# Patient Record
Sex: Female | Born: 1995 | Race: White | Hispanic: No | Marital: Single | Smoking: Never smoker
Health system: Southern US, Community
[De-identification: ages and names within clinical notes are randomized; demographics above are authoritative.]

## PROBLEM LIST (undated history)

## (undated) DIAGNOSIS — K5900 Constipation, unspecified: Secondary | ICD-10-CM

## (undated) DIAGNOSIS — I951 Orthostatic hypotension: Secondary | ICD-10-CM

## (undated) DIAGNOSIS — F909 Attention-deficit hyperactivity disorder, unspecified type: Secondary | ICD-10-CM

## (undated) DIAGNOSIS — T7840XA Allergy, unspecified, initial encounter: Secondary | ICD-10-CM

## (undated) DIAGNOSIS — Z9109 Other allergy status, other than to drugs and biological substances: Secondary | ICD-10-CM

## (undated) DIAGNOSIS — R110 Nausea: Secondary | ICD-10-CM

## (undated) DIAGNOSIS — F419 Anxiety disorder, unspecified: Secondary | ICD-10-CM

## (undated) HISTORY — DX: Allergy, unspecified, initial encounter: T78.40XA

## (undated) HISTORY — DX: Nausea: R11.0

## (undated) HISTORY — DX: Constipation, unspecified: K59.00

## (undated) HISTORY — DX: Anxiety disorder, unspecified: F41.9

## (undated) HISTORY — DX: Orthostatic hypotension: I95.1

## (undated) HISTORY — PX: TYMPANOSTOMY TUBE PLACEMENT: SHX32

## (undated) HISTORY — DX: Attention-deficit hyperactivity disorder, unspecified type: F90.9

## (undated) HISTORY — DX: Other allergy status, other than to drugs and biological substances: Z91.09

---
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
## 2017-02-19 ENCOUNTER — Telehealth (HOSPITAL_COMMUNITY): Payer: Self-pay | Admitting: *Deleted

## 2017-02-19 NOTE — Telephone Encounter (Signed)
Pt mother called stating pt is having a hard time in [place]. Per pt mother, pt is Have suicidal thoughts and was got in to be seen at a counselor there. Per pt mother she thinks it was momentary. Per pt mother she don't think pt have those thoughts right now. Per pt mother pt is going to [employer] in [place] and pt should be returning home in December. Per pt mother, she do not know if pt needs to adjust her medications or not. Per pt mother, pt is struggling in studying but she is making decent grades. Per pt mother, she is having to mail pt her medications monthly due to the pharmacy in [place] informing her that they can not fill her medications there due to pt having Medicaid.

## 2017-02-20 NOTE — Telephone Encounter (Signed)
I cannot adjust meds from here. She needs to see a psychiatrist in [place] ASAP

## 2017-02-26 ENCOUNTER — Encounter (HOSPITAL_COMMUNITY): Payer: Self-pay | Admitting: *Deleted

## 2017-03-03 NOTE — Telephone Encounter (Signed)
Spoke with pt mother and per pt mother, pt informed her after office conversation that she was feeling better. Per pt mother, pt informed her that she is currently seeing her therapist twice a week and she thinks she feels better since then. Pt mother verbalized understanding with what provider stated.

## 2017-06-05 ENCOUNTER — Ambulatory Visit: Payer: Self-pay | Admitting: Family Medicine

## 2017-06-12 ENCOUNTER — Encounter: Payer: Self-pay | Admitting: Family Medicine

## 2017-07-15 ENCOUNTER — Telehealth (HOSPITAL_COMMUNITY): Payer: Self-pay | Admitting: *Deleted

## 2017-07-15 NOTE — Telephone Encounter (Signed)
She will need to come in 

## 2017-07-15 NOTE — Telephone Encounter (Signed)
Called Patient Per Dr Tenny Crawoss to come in for a office visit

## 2017-07-15 NOTE — Telephone Encounter (Signed)
Dr Tenny Tucker Samantha went to [employer] last semester. But unfortunately her anxiety & depression got the best of her   and she was unable to return. Housing will excuse her from returning back to school with a doctor's note   stating that she has been under care. Aman said you could e-mail her the note & she would e-mail to school.  E-mail address: [email or web address]

## 2017-07-16 ENCOUNTER — Encounter (HOSPITAL_COMMUNITY): Payer: Self-pay | Admitting: Psychiatry

## 2017-07-16 ENCOUNTER — Telehealth: Payer: Self-pay

## 2017-07-16 NOTE — Telephone Encounter (Signed)
done

## 2017-07-16 NOTE — Telephone Encounter (Signed)
Contacted mom Shawna OrleansMelanie and she will stop by office to pick up letter. I informed her that it will be at the front desk

## 2017-07-16 NOTE — Telephone Encounter (Signed)
Mom called requesting a letter be written for Samantha Tucker stating that she is currently under Dr. Tenny Crawoss care and when she first started coming to the office.  Dina has an appointment scheduled for 07/22/17. Please advise

## 2017-07-22 ENCOUNTER — Ambulatory Visit (INDEPENDENT_AMBULATORY_CARE_PROVIDER_SITE_OTHER): Payer: Medicaid Other | Admitting: Family Medicine

## 2017-07-22 ENCOUNTER — Other Ambulatory Visit: Payer: Self-pay | Admitting: Family Medicine

## 2017-07-22 ENCOUNTER — Encounter: Payer: Self-pay | Admitting: Family Medicine

## 2017-07-22 ENCOUNTER — Encounter (HOSPITAL_COMMUNITY): Payer: Self-pay | Admitting: Psychiatry

## 2017-07-22 ENCOUNTER — Ambulatory Visit (INDEPENDENT_AMBULATORY_CARE_PROVIDER_SITE_OTHER): Payer: Medicaid Other | Admitting: Psychiatry

## 2017-07-22 VITALS — BP 137/80 | HR 60 | Temp 98.9°F | Ht 61.0 in | Wt 158.0 lb

## 2017-07-22 DIAGNOSIS — Z114 Encounter for screening for human immunodeficiency virus [HIV]: Secondary | ICD-10-CM | POA: Diagnosis not present

## 2017-07-22 DIAGNOSIS — K5900 Constipation, unspecified: Secondary | ICD-10-CM | POA: Diagnosis not present

## 2017-07-22 DIAGNOSIS — N898 Other specified noninflammatory disorders of vagina: Secondary | ICD-10-CM | POA: Diagnosis not present

## 2017-07-22 DIAGNOSIS — N946 Dysmenorrhea, unspecified: Secondary | ICD-10-CM | POA: Diagnosis not present

## 2017-07-22 DIAGNOSIS — Z811 Family history of alcohol abuse and dependence: Secondary | ICD-10-CM | POA: Diagnosis not present

## 2017-07-22 DIAGNOSIS — R000 Tachycardia, unspecified: Secondary | ICD-10-CM

## 2017-07-22 DIAGNOSIS — I951 Orthostatic hypotension: Secondary | ICD-10-CM

## 2017-07-22 DIAGNOSIS — Z818 Family history of other mental and behavioral disorders: Secondary | ICD-10-CM | POA: Diagnosis not present

## 2017-07-22 DIAGNOSIS — I498 Other specified cardiac arrhythmias: Secondary | ICD-10-CM | POA: Insufficient documentation

## 2017-07-22 DIAGNOSIS — R450 Nervousness: Secondary | ICD-10-CM | POA: Diagnosis not present

## 2017-07-22 DIAGNOSIS — F331 Major depressive disorder, recurrent, moderate: Secondary | ICD-10-CM | POA: Diagnosis not present

## 2017-07-22 DIAGNOSIS — F419 Anxiety disorder, unspecified: Secondary | ICD-10-CM | POA: Diagnosis not present

## 2017-07-22 DIAGNOSIS — G90A Postural orthostatic tachycardia syndrome (POTS): Secondary | ICD-10-CM | POA: Insufficient documentation

## 2017-07-22 LAB — PREGNANCY, URINE: Preg Test, Ur: NEGATIVE

## 2017-07-22 LAB — WET PREP FOR TRICH, YEAST, CLUE
Clue Cell Exam: NEGATIVE
Trichomonas Exam: NEGATIVE
Yeast Exam: NEGATIVE

## 2017-07-22 MED ORDER — AMPHETAMINE-DEXTROAMPHET ER 20 MG PO CP24: 20.0000 mg | ORAL_CAPSULE | Freq: Every day | ORAL | 0 refills | Status: DC

## 2017-07-22 MED ORDER — GABAPENTIN 300 MG PO CAPS: 300.0000 mg | ORAL_CAPSULE | Freq: Three times a day (TID) | ORAL | 2 refills | Status: DC

## 2017-07-22 MED ORDER — BUPROPION HCL ER (XL) 300 MG PO TB24: 300.0000 mg | ORAL_TABLET | ORAL | 2 refills | Status: DC

## 2017-07-22 MED ORDER — TRAZODONE HCL 100 MG PO TABS: 100.0000 mg | ORAL_TABLET | Freq: Every day | ORAL | 2 refills | Status: DC

## 2017-07-22 MED ORDER — NAPROXEN 500 MG PO TABS: 500.0000 mg | ORAL_TABLET | Freq: Two times a day (BID) | ORAL | 0 refills | Status: DC

## 2017-07-22 NOTE — Progress Notes (Signed)
 Subjective: CC: menstrual cramps PCP: Dettinger, Joshua A, MD HPI:Samantha Tucker is a 22 y.o. female presenting to clinic today for:  1. Menstrual cramps/ Vaginal discharge Patient has a several year history of cramping with periods.  She notes that over the last 6-7 months, cramping has become more severe.  She notes associated nausea, fatigue, headache.  She reports that the first 3 days of her period are typically heavy but they lighten up after that.  Periods occur every 21-28 days and typically last about 5 days.  Denies any heavy menstrual bleeding during the day but notes that sometimes is heavy at night such that she has bleedthrough's.  She notes a history of constipation, which seems to alternate with diarrhea during periods.  Mother notes that her medical history significant for POTS.  Family history significant for maternal history of fibroids and PCOS.  Negative family history for clotting disorders.  Patient has never been sexually active.  She does report some abnormal vaginal discharge that has an odor to it.  She notes she wears a panty liner for this but has never had it evaluated.  2. Constipation Patient notes a long-standing history of constipation.  She notes that she has increased her fiber, water intake and used MiraLAX in the past with little improvement.  Constipation seems to worse and alternates with diarrhea during menstrual cycles.  Denies blood in stool.  Nausea as above.  ROS: Per HPI  Allergies  Allergen Reactions  . Other Hives and Shortness Of Breath    Develops hives with exposure to cold air.   . Claritin [Loratadine]     Fainting spells  . Sulfa Antibiotics   . Latex Hives   Past Medical History:  Diagnosis Date  . ADHD (attention deficit hyperactivity disorder)   . Allergy   . Anxiety   . Constipation   . Environmental allergies   . Nausea   . Postural hypotension     Current Outpatient Medications:  .  albuterol (PROVENTIL HFA;VENTOLIN HFA)  108 (90 BASE) MCG/ACT inhaler, Inhale 2 puffs into the lungs every 6 (six) hours as needed for wheezing or shortness of breath. Reported on 07/03/2015, Disp: , Rfl:  .  amoxicillin-clavulanate (AUGMENTIN) 875-125 MG tablet, Take 1 tablet by mouth 2 (two) times daily., Disp: 20 tablet, Rfl: 0 .  amphetamine-dextroamphetamine (ADDERALL XR) 15 MG 24 hr capsule, Take 1 capsule by mouth daily., Disp: 30 capsule, Rfl: 0 .  amphetamine-dextroamphetamine (ADDERALL XR) 15 MG 24 hr capsule, Take 1 capsule by mouth daily., Disp: 30 capsule, Rfl: 0 .  amphetamine-dextroamphetamine (ADDERALL XR) 15 MG 24 hr capsule, Take 1 capsule by mouth daily., Disp: 30 capsule, Rfl: 0 .  amphetamine-dextroamphetamine (ADDERALL XR) 15 MG 24 hr capsule, Take 1 capsule by mouth daily., Disp: 30 capsule, Rfl: 0 .  buPROPion (WELLBUTRIN XL) 300 MG 24 hr tablet, Take 1 tablet (300 mg total) by mouth every morning., Disp: 90 tablet, Rfl: 2 .  desloratadine (CLARINEX) 5 MG tablet, Take 5 mg by mouth daily., Disp: , Rfl:  .  fluticasone (FLONASE) 50 MCG/ACT nasal spray, Place 2 sprays into both nostrils daily., Disp: 16 g, Rfl: 4 .  gabapentin (NEURONTIN) 300 MG capsule, Take 1 capsule (300 mg total) by mouth 2 (two) times daily., Disp: 180 capsule, Rfl: 2 .  montelukast (SINGULAIR) 10 MG tablet, Take 10 mg by mouth at bedtime., Disp: , Rfl:  .  Olopatadine HCl (PATANASE NA), Place into the nose daily. Reported on   05/26/2015, Disp: , Rfl:  .  ondansetron (ZOFRAN) 4 MG tablet, Take 1 tablet (4 mg total) by mouth every 8 (eight) hours as needed for nausea or vomiting., Disp: 30 tablet, Rfl: 1 .  traZODone (DESYREL) 100 MG tablet, Take 1 tablet (100 mg total) by mouth at bedtime., Disp: 90 tablet, Rfl: 2 Social History   Socioeconomic History  . Marital status: Single    Spouse name: Not on file  . Number of children: Not on file  . Years of education: Not on file  . Highest education level: Not on file  Social Needs  . Financial  resource strain: Not on file  . Food insecurity - worry: Not on file  . Food insecurity - inability: Not on file  . Transportation needs - medical: Not on file  . Transportation needs - non-medical: Not on file  Occupational History  . Not on file  Tobacco Use  . Smoking status: Never Smoker  . Smokeless tobacco: Never Used  Substance and Sexual Activity  . Alcohol use: No    Comment: 12-27-15 per pt no   . Drug use: No    Comment: 12-27-15 per pt no   . Sexual activity: No  Other Topics Concern  . Not on file  Social History Narrative  . Not on file   Family History  Problem Relation Age of Onset  . Bipolar disorder Father   . Bipolar disorder Sister   . Bipolar disorder Brother   . Bipolar disorder Paternal Uncle   . Alcohol abuse Paternal Uncle   . Bipolar disorder Maternal Grandmother   . Bipolar disorder Paternal Grandfather   . Alcohol abuse Paternal Grandfather   . Bipolar disorder Paternal Grandmother   . Bipolar disorder Brother   . Bipolar disorder Brother     Objective: Office vital signs reviewed. BP 137/80   Pulse 60   Temp 98.9 F (37.2 C) (Oral)   Ht 5' 1" (1.549 m)   Wt 158 lb (71.7 kg)   BMI 29.85 kg/m   Physical Examination:  General: Awake, alert, well nourished, No acute distress HEENT: sclera white, MMM, no erythema Cardio: regular rate and rhythm, S1S2 heard, no murmurs appreciated Pulm: clear to auscultation bilaterally, no wheezes, rhonchi or rales; normal work of breathing on room air GI: soft, non-tender, non-distended, bowel sounds present x4, no hepatomegaly, no splenomegaly, no masses GU: No palpable adnexal or uterine masses externally.  Internal GU exam not performed.  Results for orders placed or performed in visit on 07/22/17 (from the past 24 hour(s))  Pregnancy, urine     Status: None   Collection Time: 07/22/17  9:30 AM  Result Value Ref Range   Preg Test, Ur Negative Negative   Narrative   Performed at:  01 - [facility]  [place] [address], [place], [place]  [id number] Lab Director: Patricia Lawson BSMT, Phone:  [phone]  WET PREP FOR TRICH, YEAST, CLUE     Status: None   Collection Time: 07/22/17  9:30 AM  Result Value Ref Range   Trichomonas Exam Negative Negative   Yeast Exam Negative Negative   Clue Cell Exam Negative Negative   Narrative   Performed at:  01 - [facility] [place] [address], [place], [place]  [id number] Lab Director: Patricia Lawson BSMT, Phone:  [phone]    Assessment/ Plan: 21 y.o. female   1. Dysmenorrhea We will further evaluate with laboratory testing.  ?Hormone mediated vs fibroids vs PCOS vs endometriosis.    There are no palpable abnormalities on her exam today.  Urine pregnancy is negative.  Urine GC/CT, TSH, CBC, CMP sent.  Will contact patient's mother at the number listed once these have resulted.  Patient does give verbal permission for her mother to have these results.  If metabolic workup and STI workup is negative, we discussed that we could consider initiation of OCPs at a low dose to see if this would help symptoms, since she notes little improvement with oral NSAIDs.  We also discussed consideration for pelvic ultrasound.  Mother would like to defer this as long as possible because her daughter is a virgin.  I prescribed Naprosyn 500 mg for her to take twice daily as needed pelvic pain/abdominal cramping.  Plan for Ortho Tri-Cyclen Lo daily pending below tests.  Would like to see back in 1 month for check in. - Pregnancy, urine - Chlamydia/Gonococcus/Trichomonas, NAA - TSH - CBC with Differential - CMP14+EGFR - WET PREP FOR TRICH, YEAST, CLUE  2. Constipation, unspecified constipation type We discussed increase of fiber, physical activity and water.  She has already been using MiraLAX and seems refractory to this.  Could consider referral to GI for further evaluation.  3. Screening for HIV without presence of risk factors - HIV antibody (with  reflex)  4. Vaginal discharge Wet prep was negative (patient collected self).  Either she poorly collected the sample or vaginal discharge is physiologic.  We will plan to send handout for vaginal hygiene practices.     Orders Placed This Encounter  Procedures  . Chlamydia/Gonococcus/Trichomonas, NAA  . WET PREP FOR TRICH, YEAST, CLUE  . Pregnancy, urine  . TSH  . CBC with Differential  . CMP14+EGFR  . HIV antibody (with reflex)   Meds ordered this encounter  Medications  . naproxen (NAPROSYN) 500 MG tablet    Sig: Take 1 tablet (500 mg total) by mouth 2 (two) times daily with a meal. As needed for menstrual cramping.    Dispense:  30 tablet    Refill:  0   Total time spent with patient 26 minutes.  Greater than 50% of encounter spent in coordination of care/counseling.   M , DO [facility] [phone]   

## 2017-07-22 NOTE — Patient Instructions (Signed)
You had labs performed today.  You will be contacted with the results of the labs once they are available, usually in the next 3 days for routine lab work.  As we discussed, we may need to consider doing a pelvic ultrasound if treatment with oral contraceptive pills does not work.  I will contact you with the results of her labs once they are available and we will decide whether or not oral contraceptive pills are appropriate.  In the interim, I have prescribed her high-dose naproxen to take twice a day if needed for menstrual cramping during periods.  Do not use other NSAIDs while taking this medication.  Make sure that she takes this with a meal and plenty of water.  You have prescribed a nonsteroidal anti-inflammatory drug (NSAID) today. This will help with ear pain and inflammation. Please do not take any other NSAIDs (ibuprofen/Motrin/Advil, naproxen/Aleve, meloxicam/Mobic, Voltaren/diclofenac). Please make sure to eat a meal when taking this medication.   Caution:  If you have a history of acid reflux/indigestion, I recommend that you take an antacid (such as Prilosec, Prevacid) daily while on the NSAID.  If you have a history of bleeding disorder, gastric ulcer, are on a blood thinner (like warfarin/Coumadin, Xarelto, Eliquis, etc) please do not take NSAID.  If you have ever had a heart attack, you should not take NSAIDs.  Dysmenorrhea Menstrual cramps (dysmenorrhea) are caused by the muscles of the uterus tightening (contracting) during a menstrual period. For some women, this discomfort is merely bothersome. For others, dysmenorrhea can be severe enough to interfere with everyday activities for a few days each month. Primary dysmenorrhea is menstrual cramps that last a couple of days when you start having menstrual periods or soon after. This often begins after a teenager starts having her period. As a woman gets older or has a baby, the cramps will usually lessen or disappear. Secondary  dysmenorrhea begins later in life, lasts longer, and the pain may be stronger than primary dysmenorrhea. The pain may start before the period and last a few days after the period. What are the causes? Dysmenorrhea is usually caused by an underlying problem, such as:  The tissue lining the uterus grows outside of the uterus in other areas of the body (endometriosis).  The endometrial tissue, which normally lines the uterus, is found in or grows into the muscular walls of the uterus (adenomyosis).  The pelvic blood vessels are engorged with blood just before the menstrual period (pelvic congestive syndrome).  Overgrowth of cells (polyps) in the lining of the uterus or cervix.  Falling down of the uterus (prolapse) because of loose or stretched ligaments.  Depression.  Bladder problems, infection, or inflammation.  Problems with the intestine, a tumor, or irritable bowel syndrome.  Cancer of the female organs or bladder.  A severely tipped uterus.  A very tight opening or closed cervix.  Noncancerous tumors of the uterus (fibroids).  Pelvic inflammatory disease (PID).  Pelvic scarring (adhesions) from a previous surgery.  Ovarian cyst.  An intrauterine device (IUD) used for birth control.  What increases the risk? You may be at greater risk of dysmenorrhea if:  You are younger than age 81.  You started puberty early.  You have irregular or heavy bleeding.  You have never given birth.  You have a family history of this problem.  You are a smoker.  What are the signs or symptoms?  Cramping or throbbing pain in your lower abdomen.  Headaches.  Lower back  pain.  Nausea or vomiting.  Diarrhea.  Sweating or dizziness.  Loose stools. How is this diagnosed? A diagnosis is based on your history, symptoms, physical exam, diagnostic tests, or procedures. Diagnostic tests or procedures may include:  Blood tests.  Ultrasonography.  An examination of the lining  of the uterus (dilation and curettage, D&C).  An examination inside your abdomen or pelvis with a scope (laparoscopy).  X-rays.  CT scan.  MRI.  An examination inside the bladder with a scope (cystoscopy).  An examination inside the intestine or stomach with a scope (colonoscopy, gastroscopy).  How is this treated? Treatment depends on the cause of the dysmenorrhea. Treatment may include:  Pain medicine prescribed by your health care provider.  Birth control pills or an IUD with progesterone hormone in it.  Hormone replacement therapy.  Nonsteroidal anti-inflammatory drugs (NSAIDs). These may help stop the production of prostaglandins.  Surgery to remove adhesions, endometriosis, ovarian cyst, or fibroids.  Removal of the uterus (hysterectomy).  Progesterone shots to stop the menstrual period.  Cutting the nerves on the sacrum that go to the female organs (presacral neurectomy).  Electric current to the sacral nerves (sacral nerve stimulation).  Antidepressant medicine.  Psychiatric therapy, counseling, or group therapy.  Exercise and physical therapy.  Meditation and yoga therapy.  Acupuncture.  Follow these instructions at home:  Only take over-the-counter or prescription medicines as directed by your health care provider.  Place a heating pad or hot water bottle on your lower back or abdomen. Do not sleep with the heating pad.  Use aerobic exercises, walking, swimming, biking, and other exercises to help lessen the cramping.  Massage to the lower back or abdomen may help.  Stop smoking.  Avoid alcohol and caffeine. Contact a health care provider if:  Your pain does not get better with medicine.  You have pain with sexual intercourse.  Your pain increases and is not controlled with medicines.  You have abnormal vaginal bleeding with your period.  You develop nausea or vomiting with your period that is not controlled with medicine. Get help right  away if: You pass out. This information is not intended to replace advice given to you by your health care provider. Make sure you discuss any questions you have with your health care provider. Document Released: 04/29/2005 Document Revised: 10/05/2015 Document Reviewed: 10/15/2012 Elsevier Interactive Patient Education  2017 [employer].  Constipation, Adult Constipation is when a person has fewer bowel movements in a week than normal, has difficulty having a bowel movement, or has stools that are dry, hard, or larger than normal. Constipation may be caused by an underlying condition. It may become worse with age if a person takes certain medicines and does not take in enough fluids. Follow these instructions at home: Eating and drinking   Eat foods that have a lot of fiber, such as fresh fruits and vegetables, whole grains, and beans.  Limit foods that are high in fat, low in fiber, or overly processed, such as french fries, hamburgers, cookies, candies, and soda.  Drink enough fluid to keep your urine clear or pale yellow. General instructions  Exercise regularly or as told by your health care provider.  Go to the restroom when you have the urge to go. Do not hold it in.  Take over-the-counter and prescription medicines only as told by your health care provider. These include any fiber supplements.  Practice pelvic floor retraining exercises, such as deep breathing while relaxing the lower abdomen and pelvic  floor relaxation during bowel movements.  Watch your condition for any changes.  Keep all follow-up visits as told by your health care provider. This is important. Contact a health care provider if:  You have pain that gets worse.  You have a fever.  You do not have a bowel movement after 4 days.  You vomit.  You are not hungry.  You lose weight.  You are bleeding from the anus.  You have thin, pencil-like stools. Get help right away if:  You have a fever and  your symptoms suddenly get worse.  You leak stool or have blood in your stool.  Your abdomen is bloated.  You have severe pain in your abdomen.  You feel dizzy or you faint. This information is not intended to replace advice given to you by your health care provider. Make sure you discuss any questions you have with your health care provider. Document Released: 01/26/2004 Document Revised: 11/17/2015 Document Reviewed: 10/18/2015 Elsevier Interactive Patient Education  2018 [employer]

## 2017-07-22 NOTE — Progress Notes (Signed)
BH MD/PA/NP OP Progress Note  07/22/2017 11:01 AM Samantha Tucker  MRN:  [id number]  Chief Complaint:  Chief Complaint    Depression; Anxiety; Follow-up     HPI: This patient is a 22 year old white female who lives with her family in [place].  She was attending [employer] for the fall semester but came home due to depression.  The patient returns for follow-up for her symptoms of depression and anxiety.  As noted above she went to college this fall.  I last seen her in August and she seemed positive and excited about going.  She lived in an apartment with several other girls and was taking courses.  She did well on her class work but got severely depressed and overwhelmed.  Her mother was in close contact with her and noted that she had become very withdrawn and had suicidal thoughts.  She did see a counselor at the school which was helpful.  She stayed on her medication which was Wellbutrin gabapentin and Adderall XR.  The patient came home at the end of the fall semester and elected not to return.  Her depression has gotten better since she has been back home although her grandmother died since she has been back she has helped the family out tremendously since her mother was involved in taking care of the grandmother at her end of life.  She now states that she is no longer depressed or suicidal but is very anxious.  The gabapentin is helping to some degree but probably will need to be increased.  The Adderall XR is not working as well as it used to either and will also need to be increased.  She is planning to get a job but is not thinking about returning to college at the moment Visit Diagnosis:    ICD-10-CM   1. Major depressive disorder, recurrent episode, moderate (HCC) F33.1 buPROPion (WELLBUTRIN XL) 300 MG 24 hr tablet    Past Psychiatric History: Long-term outpatient treatment  Past Medical History:  Past Medical History:  Diagnosis Date  . ADHD (attention deficit  hyperactivity disorder)   . Allergy   . Anxiety   . Constipation   . Environmental allergies   . Nausea   . Postural hypotension     Past Surgical History:  Procedure Laterality Date  . TYMPANOSTOMY TUBE PLACEMENT Bilateral [id number]    Family Psychiatric History: See below  Family History:  Family History  Problem Relation Age of Onset  . Bipolar disorder Father   . Bipolar disorder Sister   . Bipolar disorder Brother   . Bipolar disorder Paternal Uncle   . Alcohol abuse Paternal Uncle   . Bipolar disorder Maternal Grandmother   . Bipolar disorder Paternal Grandfather   . Alcohol abuse Paternal Grandfather   . Bipolar disorder Paternal Grandmother   . Bipolar disorder Brother   . Bipolar disorder Brother     Social History:  Social History   Socioeconomic History  . Marital status: Single    Spouse name: None  . Number of children: None  . Years of education: None  . Highest education level: None  Social Needs  . Financial resource strain: None  . Food insecurity - worry: None  . Food insecurity - inability: None  . Transportation needs - medical: None  . Transportation needs - non-medical: None  Occupational History  . None  Tobacco Use  . Smoking status: Never Smoker  . Smokeless tobacco: Never Used  Substance and Sexual  Activity  . Alcohol use: No    Comment: 12-27-15 per pt no   . Drug use: No    Comment: 12-27-15 per pt no   . Sexual activity: No  Other Topics Concern  . None  Social History Narrative  . None    Allergies:  Allergies  Allergen Reactions  . Other Hives and Shortness Of Breath    Develops hives with exposure to cold air.   . Claritin [Loratadine]     Fainting spells  . Sulfa Antibiotics   . Latex Hives    Metabolic Disorder Labs: No results found for: HGBA1C, MPG No results found for: PROLACTIN No results found for: CHOL, TRIG, HDL, CHOLHDL, VLDL, LDLCALC Lab Results  Component Value Date   TSH 3.150 05/28/2013     Therapeutic Level Labs: No results found for: LITHIUM No results found for: VALPROATE No components found for:  CBMZ  Current Medications: Current Outpatient Medications  Medication Sig Dispense Refill  . albuterol (PROVENTIL HFA;VENTOLIN HFA) 108 (90 BASE) MCG/ACT inhaler Inhale 2 puffs into the lungs every 6 (six) hours as needed for wheezing or shortness of breath. Reported on 07/03/2015    . buPROPion (WELLBUTRIN XL) 300 MG 24 hr tablet Take 1 tablet (300 mg total) by mouth every morning. 90 tablet 2  . desloratadine (CLARINEX) 5 MG tablet Take 5 mg by mouth daily.    . fluticasone (FLONASE) 50 MCG/ACT nasal spray Place 2 sprays into both nostrils daily. 16 g 4  . gabapentin (NEURONTIN) 300 MG capsule Take 1 capsule (300 mg total) by mouth 3 (three) times daily. 270 capsule 2  . montelukast (SINGULAIR) 10 MG tablet Take 10 mg by mouth at bedtime.    . naproxen (NAPROSYN) 500 MG tablet Take 1 tablet (500 mg total) by mouth 2 (two) times daily with a meal. As needed for menstrual cramping. 30 tablet 0  . Olopatadine HCl (PATANASE NA) Place into the nose daily. Reported on 05/26/2015    . ondansetron (ZOFRAN) 4 MG tablet Take 1 tablet (4 mg total) by mouth every 8 (eight) hours as needed for nausea or vomiting. 30 tablet 1  . traZODone (DESYREL) 100 MG tablet Take 1 tablet (100 mg total) by mouth at bedtime. 90 tablet 2  . amphetamine-dextroamphetamine (ADDERALL XR) 20 MG 24 hr capsule Take 1 capsule (20 mg total) by mouth daily. 30 capsule 0  . amphetamine-dextroamphetamine (ADDERALL XR) 20 MG 24 hr capsule Take 1 capsule (20 mg total) by mouth daily. 30 capsule 0  . amphetamine-dextroamphetamine (ADDERALL XR) 20 MG 24 hr capsule Take 1 capsule (20 mg total) by mouth daily. 30 capsule 0   No current facility-administered medications for this visit.      Musculoskeletal: Strength & Muscle Tone: within normal limits Gait & Station: normal Patient leans: N/A  Psychiatric Specialty  Exam: Review of Systems  Endo/Heme/Allergies:       Dysmenorrhea  Psychiatric/Behavioral: The patient is nervous/anxious.   All other systems reviewed and are negative.   Blood pressure 106/74, pulse 74, height 5\' 1"  (1.549 m), weight 155 lb (70.3 kg), SpO2 98 %.Body mass index is 29.29 kg/m.  General Appearance: Casual and Fairly Groomed  Eye Contact:  Good  Speech:  Clear and Coherent  Volume:  Normal  Mood:  Anxious  Affect:  Congruent  Thought Process:  Goal Directed  Orientation:  Full (Time, Place, and Person)  Thought Content: WDL   Suicidal Thoughts:  No  Homicidal Thoughts:  No  Memory:  Immediate;   Good Recent;   Good Remote;   Good  Judgement:  Fair  Insight:  Fair  Psychomotor Activity:  Normal  Concentration:  Concentration: Good and Attention Span: Fair  Recall:  Good  Fund of Knowledge: Good  Language: Good  Akathisia:  No  Handed:  Right  AIMS (if indicated): not done  Assets:  Communication Skills Desire for Improvement Physical Health Resilience Social Support Talents/Skills  ADL's:  Intact  Cognition: WNL  Sleep:  Good   Screenings: PHQ2-9     Office Visit from 08/27/2016 in [facility] Clinical Support from 11/01/2015 in [facility] Office Visit from 05/28/2013 in [facility]  PHQ-2 Total Score  0  0  0       Assessment and Plan: Patient is a 22 year old female with a history of depression anxiety and ADD.  She is doing better in terms of depression so we will continue Wellbutrin XL 300 mg every morning and trazodone 100 mg at bedtime for sleep.  Because of her anxiety we will increase gabapentin to 300 mg 3 times a day.  She will increase Adderall XR to 20 mg every morning for focus.  She will return to see me in 3 months or call sooner if needed   Diannia Rudereborah Gerrick Ray, MD 07/22/2017, 11:01 AM

## 2017-07-23 ENCOUNTER — Telehealth: Payer: Self-pay | Admitting: Family Medicine

## 2017-07-23 LAB — CBC WITH DIFFERENTIAL/PLATELET
Basophils Absolute: 0 10*3/uL (ref 0.0–0.2)
Basos: 0 %
EOS (ABSOLUTE): 0.2 10*3/uL (ref 0.0–0.4)
Eos: 2 %
Hematocrit: 39.1 % (ref 34.0–46.6)
Hemoglobin: 13.5 g/dL (ref 11.1–15.9)
Immature Grans (Abs): 0 10*3/uL (ref 0.0–0.1)
Immature Granulocytes: 0 %
Lymphocytes Absolute: 2.3 10*3/uL (ref 0.7–3.1)
Lymphs: 30 %
MCH: 29.5 pg (ref 26.6–33.0)
MCHC: 34.5 g/dL (ref 31.5–35.7)
MCV: 85 fL (ref 79–97)
Monocytes Absolute: 0.9 10*3/uL (ref 0.1–0.9)
Monocytes: 12 %
Neutrophils Absolute: 4.2 10*3/uL (ref 1.4–7.0)
Neutrophils: 56 %
Platelets: 295 10*3/uL (ref 150–379)
RBC: 4.58 x10E6/uL (ref 3.77–5.28)
RDW: 15.3 % (ref 12.3–15.4)
WBC: 7.6 10*3/uL (ref 3.4–10.8)

## 2017-07-23 LAB — CMP14+EGFR
ALT: 17 IU/L (ref 0–32)
AST: 14 IU/L (ref 0–40)
Albumin/Globulin Ratio: 1.6 (ref 1.2–2.2)
Albumin: 4.6 g/dL (ref 3.5–5.5)
Alkaline Phosphatase: 74 IU/L (ref 39–117)
BUN/Creatinine Ratio: 13 (ref 9–23)
BUN: 10 mg/dL (ref 6–20)
Bilirubin Total: 0.4 mg/dL (ref 0.0–1.2)
CO2: 23 mmol/L (ref 20–29)
Calcium: 9.3 mg/dL (ref 8.7–10.2)
Chloride: 102 mmol/L (ref 96–106)
Creatinine, Ser: 0.79 mg/dL (ref 0.57–1.00)
GFR calc Af Amer: 125 mL/min/{1.73_m2} (ref >59)
GFR calc non Af Amer: 109 mL/min/{1.73_m2} (ref >59)
Globulin, Total: 2.9 g/dL (ref 1.5–4.5)
Glucose: 81 mg/dL (ref 65–99)
Potassium: 4.2 mmol/L (ref 3.5–5.2)
Sodium: 140 mmol/L (ref 134–144)
Total Protein: 7.5 g/dL (ref 6.0–8.5)

## 2017-07-23 LAB — TSH: TSH: 5.11 u[IU]/mL — ABNORMAL HIGH (ref 0.450–4.500)

## 2017-07-23 LAB — HIV ANTIBODY (ROUTINE TESTING W REFLEX): HIV Screen 4th Generation wRfx: NONREACTIVE

## 2017-07-24 ENCOUNTER — Other Ambulatory Visit: Payer: Self-pay | Admitting: Family Medicine

## 2017-07-24 LAB — CHLAMYDIA/GONOCOCCUS/TRICHOMONAS, NAA
Chlamydia by NAA: NEGATIVE
Gonococcus by NAA: NEGATIVE
Trich vag by NAA: NEGATIVE

## 2017-07-24 LAB — T4, FREE: Free T4: 1.07 ng/dL (ref 0.93–1.60)

## 2017-07-24 LAB — T3: T3, Total: 100 ng/dL (ref 71–180)

## 2017-07-24 LAB — SPECIMEN STATUS REPORT

## 2017-07-24 MED ORDER — NORGESTIM-ETH ESTRAD TRIPHASIC 0.18/0.215/0.25 MG-25 MCG PO TABS: 1.0000 | ORAL_TABLET | Freq: Every day | ORAL | 0 refills | Status: DC

## 2017-07-24 NOTE — Progress Notes (Signed)
or

## 2017-07-24 NOTE — Telephone Encounter (Signed)
Yes

## 2017-07-24 NOTE — Telephone Encounter (Signed)
Aware of results. Would like to know if Birth Control will be sent to pharmacy

## 2017-07-24 NOTE — Telephone Encounter (Signed)
Aware that birth control has been sent to pharmacy

## 2017-08-20 NOTE — Progress Notes (Signed)
Subjective: CC: abnormal menses HPI: Samantha Tucker is a 22 y.o. female presenting to clinic today for:  1. Dysmenorrhea Patient was seen 1 month ago for dysmenorrhea.  She was prescribed naproxen 500 mg p.o. twice daily and Ortho Tri-Cyclen Lo daily.  She presents today for one-month follow-up.  CBC was within normal limits and did not demonstrate anemia.  Renal function, liver function electrolytes all within normal limits.  TSH was slightly elevated to 5.11.  No therapies were started and there was plans to recheck TSH.  She presents today and notes that pain has gotten a little bit better but she is actually bleeding more and feels that her mood has been quite a bit worse with the oral contraceptive.  She notes almost daily vaginal bleeding and notes that it is particularly heavy now.  She also notes associated nausea, with standing.  She is unsure if this is related to her underlying POTS disorder versus from the pill/bleeding.  FamHx significant for PCOS and her mother and substantially large uterine fibroid tumor such that she had to have an emergent hysterectomy.  2. Elevated TSH Dysmenorrhea as above.  Patient reported alternating constipation and diarrhea. No changes in energy, hair thinning.  Her mood has been "worse over the last month" 2/2 birth control. No History of surgery on the neck, radiation to the neck, use of biotin supplements.  Family history POSITIVE for thyroid disorder in her maternal aunt.   ROS: Per HPI  Past Medical History:  Diagnosis Date  . ADHD (attention deficit hyperactivity disorder)   . Allergy   . Anxiety   . Constipation   . Environmental allergies   . Nausea   . Postural hypotension    Allergies  Allergen Reactions  . Other Hives and Shortness Of Breath    Develops hives with exposure to cold air.   . Claritin [Loratadine]     Fainting spells  . Sulfa Antibiotics   . Latex Hives    Current Outpatient Medications:  .  albuterol (PROVENTIL  HFA;VENTOLIN HFA) 108 (90 BASE) MCG/ACT inhaler, Inhale 2 puffs into the lungs every 6 (six) hours as needed for wheezing or shortness of breath. Reported on 07/03/2015, Disp: , Rfl:  .  amphetamine-dextroamphetamine (ADDERALL XR) 20 MG 24 hr capsule, Take 1 capsule (20 mg total) by mouth daily., Disp: 30 capsule, Rfl: 0 .  amphetamine-dextroamphetamine (ADDERALL XR) 20 MG 24 hr capsule, Take 1 capsule (20 mg total) by mouth daily., Disp: 30 capsule, Rfl: 0 .  amphetamine-dextroamphetamine (ADDERALL XR) 20 MG 24 hr capsule, Take 1 capsule (20 mg total) by mouth daily., Disp: 30 capsule, Rfl: 0 .  buPROPion (WELLBUTRIN XL) 300 MG 24 hr tablet, Take 1 tablet (300 mg total) by mouth every morning., Disp: 90 tablet, Rfl: 2 .  desloratadine (CLARINEX) 5 MG tablet, Take 5 mg by mouth daily., Disp: , Rfl:  .  fluticasone (FLONASE) 50 MCG/ACT nasal spray, Place 2 sprays into both nostrils daily., Disp: 16 g, Rfl: 4 .  gabapentin (NEURONTIN) 300 MG capsule, Take 1 capsule (300 mg total) by mouth 3 (three) times daily., Disp: 270 capsule, Rfl: 2 .  montelukast (SINGULAIR) 10 MG tablet, Take 10 mg by mouth at bedtime., Disp: , Rfl:  .  naproxen (NAPROSYN) 500 MG tablet, Take 1 tablet (500 mg total) by mouth 2 (two) times daily with a meal. As needed for menstrual cramping., Disp: 30 tablet, Rfl: 0 .  Norgestimate-Ethinyl Estradiol Triphasic (ORTHO TRI-CYCLEN LO) 0.18/0.215/0.25  MG-25 MCG tab, Take 1 tablet by mouth daily., Disp: 3 Package, Rfl: 0 .  Olopatadine HCl (PATANASE NA), Place into the nose daily. Reported on 05/26/2015, Disp: , Rfl:  .  ondansetron (ZOFRAN) 4 MG tablet, Take 1 tablet (4 mg total) by mouth every 8 (eight) hours as needed for nausea or vomiting., Disp: 30 tablet, Rfl: 1 .  traZODone (DESYREL) 100 MG tablet, Take 1 tablet (100 mg total) by mouth at bedtime., Disp: 90 tablet, Rfl: 2 Social History   Socioeconomic History  . Marital status: Single    Spouse name: Not on file  . Number of  children: Not on file  . Years of education: Not on file  . Highest education level: Not on file  Occupational History  . Not on file  Social Needs  . Financial resource strain: Not on file  . Food insecurity:    Worry: Not on file    Inability: Not on file  . Transportation needs:    Medical: Not on file    Non-medical: Not on file  Tobacco Use  . Smoking status: Never Smoker  . Smokeless tobacco: Never Used  Substance and Sexual Activity  . Alcohol use: No    Comment: 12-27-15 per pt no   . Drug use: No    Comment: 12-27-15 per pt no   . Sexual activity: Never  Lifestyle  . Physical activity:    Days per week: Not on file    Minutes per session: Not on file  . Stress: Not on file  Relationships  . Social connections:    Talks on phone: Not on file    Gets together: Not on file    Attends religious service: Not on file    Active member of club or organization: Not on file    Attends meetings of clubs or organizations: Not on file    Relationship status: Not on file  . Intimate partner violence:    Fear of current or ex partner: Not on file    Emotionally abused: Not on file    Physically abused: Not on file    Forced sexual activity: Not on file  Other Topics Concern  . Not on file  Social History Narrative  . Not on file   Family History  Problem Relation Age of Onset  . Bipolar disorder Father   . Bipolar disorder Sister   . Bipolar disorder Brother   . Bipolar disorder Paternal Uncle   . Alcohol abuse Paternal Uncle   . Bipolar disorder Maternal Grandmother   . Bipolar disorder Paternal Grandfather   . Alcohol abuse Paternal Grandfather   . Bipolar disorder Paternal Grandmother   . Bipolar disorder Brother   . Bipolar disorder Brother    Objective: Office vital signs reviewed. BP 122/82   Pulse 86   Temp 99.4 F (37.4 C) (Oral)   Ht 5\' 1"  (1.549 m)   Wt 153 lb (69.4 kg)   BMI 28.91 kg/m   Physical Examination:  General: Awake, alert, well  nourished, No acute distress HEENT: Normal    Neck: No masses palpated. No lymphadenopathy; thyroid not palpable. No palpable nodules.    Eyes: PERRLA, extraocular movement in tact, sclera white, no exophthalmos; no conjunctival pallor.    Throat: moist mucus membranes Cardio: regular rate and rhythm, S1S2 heard, no murmurs appreciated Pulm: clear to auscultation bilaterally, no wheezes, rhonchi or rales; normal work of breathing on room air GI: soft, non-tender, non-distended, no palpable masses  GU: uterus not enlarged upon palpation Skin: acne noted on chin Neuro: no resting tremor noted.  Assessment/ Plan: 22 y.o. female   Elevated TSH Abnormal uterine bleeding.  Unsure if this is secondary to PCO S versus fibroid versus thyroid disorder.  Recheck TSH today.  If persistently elevated, [place] refer [place] [facility] for further evaluation and initiation of thyroid medications.  Will contact mother with results on Monday.  Dysmenorrhea in adolescent Possibly related to thyroid disorder versus PCO S versus fibroid.  Mother with significant history of PCO S and large fibroid which required emergent hysterectomy.  We discussed holding off on the pelvic ultrasound for now and checking the thyroid function again.  I have changed her OCP to a monophasic, higher dose medication.  Hopefully this will improve her menstrual bleeding.  We also discussed consideration for Depo-Provera.  They would like to proceed with OCP for now.  Check CBC given prolonged menstrual cycle.  Meds ordered this encounter  Medications  . norgestimate-ethinyl estradiol (ORTHO-CYCLEN, 28,) 0.25-35 MG-MCG tablet    Sig: Take 1 tablet by mouth daily.    Dispense:  1 Package    Refill:  1  . DISCONTD: clotrimazole (LOTRIMIN) 1 % cream    Sig: Apply 1 application topically 2 (two) times daily. x7-10 days    Dispense:  30 g    Refill:  0  ^Prescribed in error.  Spoke to pharmacy and had cream  cancelled.  Raliegh IpAshly M Gottschalk, DO [facility] [phone]

## 2017-08-22 ENCOUNTER — Encounter: Payer: Self-pay | Admitting: Family Medicine

## 2017-08-22 ENCOUNTER — Other Ambulatory Visit: Payer: Self-pay | Admitting: Family Medicine

## 2017-08-22 ENCOUNTER — Ambulatory Visit (INDEPENDENT_AMBULATORY_CARE_PROVIDER_SITE_OTHER): Payer: Medicaid Other | Admitting: Family Medicine

## 2017-08-22 VITALS — BP 122/82 | HR 86 | Temp 99.4°F | Ht 61.0 in | Wt 153.0 lb

## 2017-08-22 DIAGNOSIS — R7989 Other specified abnormal findings of blood chemistry: Secondary | ICD-10-CM | POA: Diagnosis not present

## 2017-08-22 DIAGNOSIS — N946 Dysmenorrhea, unspecified: Secondary | ICD-10-CM

## 2017-08-22 DIAGNOSIS — E039 Hypothyroidism, unspecified: Secondary | ICD-10-CM | POA: Insufficient documentation

## 2017-08-22 DIAGNOSIS — Z793 Long term (current) use of hormonal contraceptives: Secondary | ICD-10-CM

## 2017-08-22 MED ORDER — CLOTRIMAZOLE 1 % EX CREA: 1.0000 "application " | TOPICAL_CREAM | Freq: Two times a day (BID) | CUTANEOUS | 0 refills | Status: DC

## 2017-08-22 MED ORDER — NORGESTIMATE-ETH ESTRADIOL 0.25-35 MG-MCG PO TABS
1.0000 | ORAL_TABLET | Freq: Every day | ORAL | 1 refills | Status: DC
Start: 2017-08-22 — End: 2017-10-20

## 2017-08-22 NOTE — Patient Instructions (Addendum)
You had labs performed today (thyroid) and CBC (anemia).  You will be contacted with the results of the labs once they are available, usually in the next 3 days for routine lab work.  Thyroid-Stimulating Hormone Test Why am I having this test? A thyroid-stimulating hormone (TSH) test is a blood test that is done to measure the level of TSH, also known as thyrotropin, in your blood. TSH is produced by the pituitary gland. The pituitary gland is a small organ located just below the brain, behind your eyes and nasal passages. It is part of a system that monitors and maintains thyroid hormone levels and thyroid gland function. Thyroid hormones affect many body parts and systems, including the system that affects how quickly your body burns fuel for energy. Your health care provider may recommend testing your TSH level if you have signs and symptoms of abnormal thyroid hormone levels. Knowing the level of TSH in your blood can help your health care provider:  Diagnose a thyroid gland or pituitary gland disorder.  Manage your condition and treatment if you have hypothyroidism or hyperthyroidism.  What kind of sample is taken? A blood sample is required for this test. It is usually collected by inserting a needle into a vein. How do I prepare for this test? There is no preparation required for this test. What are the reference ranges? Reference rangesare considered healthy rangesestablished after testing a large group of healthy people. Reference rangesmay vary among different people, labs, and hospitals. It is your responsibility to obtain your test results. Ask the lab or department performing the test when and how you will get your results. Range of Normal Values:  Adult: 0.3-5 microunits/mL or 0.3-5 milliunits/L (SI units).  Newborn: 673-18 microunits/mL or 3-18 milliunits/L.  Cord: 3-12 microunits/mL or 3-12 milliunits/L.  What do the results mean? A high level of TSH may mean:  Your  thyroid gland is not making enough thyroid hormones. When the thyroid gland does not make enough thyroid hormones, the pituitary gland releases TSH into the bloodstream. The higher-than-normal levels of TSH prompt the thyroid gland to release more thyroid hormones.  You are getting an insufficient level of thyroid hormone medicine, if you are receiving this type of treatment.  There is a problem with the pituitary gland (rare).  A low level of TSH can indicate a problem with the pituitary gland. Talk with your health care provider to discuss your results, treatment options, and if necessary, the need for more tests. Talk with your health care provider if you have any questions about your results. Talk with your health care provider to discuss your results, treatment options, and if necessary, the need for more tests. Talk with your health care provider if you have any questions about your results. This information is not intended to replace advice given to you by your health care provider. Make sure you discuss any questions you have with your health care provider. Document Released: 05/24/2004 Document Revised: 12/31/2015 Document Reviewed: 09/22/2013 Elsevier Interactive Patient Education  [employer]2018 [employer] Inc[employer]

## 2017-08-22 NOTE — Assessment & Plan Note (Signed)
Possibly related to thyroid disorder versus PCO S versus fibroid.  Mother with significant history of PCO S and large fibroid which required emergent hysterectomy.  We discussed holding off on the pelvic ultrasound for now and checking the thyroid function again.  I have changed her OCP to a monophasic, higher dose medication.  Hopefully this will improve her menstrual bleeding.  We also discussed consideration for Depo-Provera.  They would like to proceed with OCP for now.  Check CBC given prolonged menstrual cycle.

## 2017-08-22 NOTE — Assessment & Plan Note (Signed)
Abnormal uterine bleeding.  Unsure if this is secondary to PCO S versus fibroid versus thyroid disorder.  Recheck TSH today.  If persistently elevated, [place] and initiation of thyroid medications.  Will contact mother with results on Monday.

## 2017-08-23 LAB — CBC WITH DIFFERENTIAL/PLATELET
Basophils Absolute: 0 10*3/uL (ref 0.0–0.2)
Basos: 0 %
EOS (ABSOLUTE): 0.2 10*3/uL (ref 0.0–0.4)
Eos: 3 %
Hematocrit: 39.4 % (ref 34.0–46.6)
Hemoglobin: 13.3 g/dL (ref 11.1–15.9)
Immature Grans (Abs): 0 10*3/uL (ref 0.0–0.1)
Immature Granulocytes: 0 %
Lymphocytes Absolute: 2.1 10*3/uL (ref 0.7–3.1)
Lymphs: 36 %
MCH: 29.6 pg (ref 26.6–33.0)
MCHC: 33.8 g/dL (ref 31.5–35.7)
MCV: 88 fL (ref 79–97)
Monocytes Absolute: 0.6 10*3/uL (ref 0.1–0.9)
Monocytes: 10 %
Neutrophils Absolute: 3 10*3/uL (ref 1.4–7.0)
Neutrophils: 51 %
Platelets: 280 10*3/uL (ref 150–379)
RBC: 4.49 x10E6/uL (ref 3.77–5.28)
RDW: 13.8 % (ref 12.3–15.4)
WBC: 6 10*3/uL (ref 3.4–10.8)

## 2017-08-23 LAB — TSH: TSH: 4.93 u[IU]/mL — ABNORMAL HIGH (ref 0.450–4.500)

## 2017-08-26 ENCOUNTER — Other Ambulatory Visit: Payer: Self-pay | Admitting: Family Medicine

## 2017-08-26 ENCOUNTER — Telehealth: Payer: Self-pay | Admitting: Family Medicine

## 2017-08-26 DIAGNOSIS — E039 Hypothyroidism, unspecified: Secondary | ICD-10-CM

## 2017-08-26 LAB — SPECIMEN STATUS REPORT

## 2017-08-26 LAB — T3: T3, Total: 157 ng/dL (ref 71–180)

## 2017-08-26 LAB — T4, FREE: Free T4: 1.33 ng/dL (ref 0.93–1.60)

## 2017-08-26 NOTE — Telephone Encounter (Signed)
Mother called requesting additional results

## 2017-08-27 ENCOUNTER — Telehealth: Payer: Self-pay | Admitting: *Deleted

## 2017-08-27 NOTE — Telephone Encounter (Signed)
Would like new results from lab work added on 15th.

## 2017-08-27 NOTE — Telephone Encounter (Signed)
Her T4 and T3 were inappropriately within normal limits.

## 2017-08-28 ENCOUNTER — Telehealth: Payer: Self-pay | Admitting: Family Medicine

## 2017-08-28 NOTE — Telephone Encounter (Signed)
Aware, per voice mail left on voice mail.

## 2017-09-01 NOTE — Telephone Encounter (Signed)
Mother aware you are out of the office today and will return tomorrow. She is wanting to know about the results that were added.

## 2017-09-02 NOTE — Telephone Encounter (Signed)
Mother aware of results and aware that referral has been placed to endocrinologist

## 2017-09-02 NOTE — Telephone Encounter (Signed)
Labs were inappropriately normal for an elevated TSH.

## 2017-09-05 ENCOUNTER — Ambulatory Visit (INDEPENDENT_AMBULATORY_CARE_PROVIDER_SITE_OTHER): Payer: Medicaid Other | Admitting: Family Medicine

## 2017-09-05 ENCOUNTER — Encounter: Payer: Self-pay | Admitting: Family Medicine

## 2017-09-05 VITALS — BP 125/86 | HR 98 | Temp 98.4°F | Ht 61.0 in | Wt 153.5 lb

## 2017-09-05 DIAGNOSIS — J209 Acute bronchitis, unspecified: Secondary | ICD-10-CM

## 2017-09-05 DIAGNOSIS — J019 Acute sinusitis, unspecified: Secondary | ICD-10-CM | POA: Diagnosis not present

## 2017-09-05 DIAGNOSIS — B9689 Other specified bacterial agents as the cause of diseases classified elsewhere: Secondary | ICD-10-CM | POA: Diagnosis not present

## 2017-09-05 MED ORDER — PREDNISONE 20 MG PO TABS: 40.0000 mg | ORAL_TABLET | Freq: Every day | ORAL | 0 refills | Status: AC

## 2017-09-05 MED ORDER — AMOXICILLIN-POT CLAVULANATE 875-125 MG PO TABS: 1.0000 | ORAL_TABLET | Freq: Two times a day (BID) | ORAL | 0 refills | Status: DC

## 2017-09-05 MED ORDER — BENZONATATE 200 MG PO CAPS
200.0000 mg | ORAL_CAPSULE | Freq: Two times a day (BID) | ORAL | 0 refills | Status: DC | PRN
Start: 2017-09-05 — End: 2017-12-11

## 2017-09-05 MED ORDER — ALBUTEROL SULFATE HFA 108 (90 BASE) MCG/ACT IN AERS: 2.0000 | INHALATION_SPRAY | Freq: Four times a day (QID) | RESPIRATORY_TRACT | 0 refills | Status: DC | PRN

## 2017-09-05 NOTE — Progress Notes (Signed)
Subjective: CC: URI PCP: Raliegh IpGottschalk, Jadalee Westcott M, DO MWU:XLKGHPI:Samantha Tucker is a 22 y.o. female presenting to clinic today for:  1. Cough Patient reports a harsh cough, chest congestion, sinus headache, facial pain, nasal/sinus congestion with associated rhinorrhea and purulent nasal discharge that started on Monday and has progressively gotten worse.  She notes associated subjective fevers and nausea and vomiting.  Vomit is nonbloody and typically consists of copious amounts of mucus.  She notes associated shortness of breath, wheeze.  She has been using Tylenol, Advil, over-the-counter nasal decongestants, Nettie pot and her albuterol inhaler.  The albuterol inhaler does help with the shortness of breath and wheeze but other treatments are non-effective.   ROS: Per HPI  Allergies  Allergen Reactions  . Other Hives and Shortness Of Breath    Develops hives with exposure to cold air.   . Claritin [Loratadine]     Fainting spells  . Sulfa Antibiotics   . Latex Hives   Past Medical History:  Diagnosis Date  . ADHD (attention deficit hyperactivity disorder)   . Allergy   . Anxiety   . Constipation   . Environmental allergies   . Nausea   . Postural hypotension     Current Outpatient Medications:  .  albuterol (PROVENTIL HFA;VENTOLIN HFA) 108 (90 Base) MCG/ACT inhaler, Inhale 2 puffs into the lungs every 6 (six) hours as needed for wheezing or shortness of breath., Disp: 1 Inhaler, Rfl: 0 .  amphetamine-dextroamphetamine (ADDERALL XR) 20 MG 24 hr capsule, Take 1 capsule (20 mg total) by mouth daily., Disp: 30 capsule, Rfl: 0 .  amphetamine-dextroamphetamine (ADDERALL XR) 20 MG 24 hr capsule, Take 1 capsule (20 mg total) by mouth daily., Disp: 30 capsule, Rfl: 0 .  amphetamine-dextroamphetamine (ADDERALL XR) 20 MG 24 hr capsule, Take 1 capsule (20 mg total) by mouth daily., Disp: 30 capsule, Rfl: 0 .  buPROPion (WELLBUTRIN XL) 300 MG 24 hr tablet, Take 1 tablet (300 mg total) by mouth every  morning., Disp: 90 tablet, Rfl: 2 .  desloratadine (CLARINEX) 5 MG tablet, Take 5 mg by mouth daily., Disp: , Rfl:  .  fluticasone (FLONASE) 50 MCG/ACT nasal spray, Place 2 sprays into both nostrils daily., Disp: 16 g, Rfl: 4 .  gabapentin (NEURONTIN) 300 MG capsule, Take 1 capsule (300 mg total) by mouth 3 (three) times daily., Disp: 270 capsule, Rfl: 2 .  montelukast (SINGULAIR) 10 MG tablet, Take 10 mg by mouth at bedtime., Disp: , Rfl:  .  naproxen (NAPROSYN) 500 MG tablet, Take 1 tablet (500 mg total) by mouth 2 (two) times daily with a meal. As needed for menstrual cramping., Disp: 30 tablet, Rfl: 0 .  norgestimate-ethinyl estradiol (ORTHO-CYCLEN, 28,) 0.25-35 MG-MCG tablet, Take 1 tablet by mouth daily., Disp: 1 Package, Rfl: 1 .  Olopatadine HCl (PATANASE NA), Place into the nose daily. Reported on 05/26/2015, Disp: , Rfl:  .  ondansetron (ZOFRAN) 4 MG tablet, Take 1 tablet (4 mg total) by mouth every 8 (eight) hours as needed for nausea or vomiting., Disp: 30 tablet, Rfl: 1 .  traZODone (DESYREL) 100 MG tablet, Take 1 tablet (100 mg total) by mouth at bedtime., Disp: 90 tablet, Rfl: 2 .  amoxicillin-clavulanate (AUGMENTIN) 875-125 MG tablet, Take 1 tablet by mouth 2 (two) times daily., Disp: 20 tablet, Rfl: 0 .  predniSONE (DELTASONE) 20 MG tablet, Take 2 tablets (40 mg total) by mouth daily with breakfast for 5 days., Disp: 10 tablet, Rfl: 0 Social History   Socioeconomic  History  . Marital status: Single    Spouse name: Not on file  . Number of children: Not on file  . Years of education: Not on file  . Highest education level: Not on file  Occupational History  . Not on file  Social Needs  . Financial resource strain: Not on file  . Food insecurity:    Worry: Not on file    Inability: Not on file  . Transportation needs:    Medical: Not on file    Non-medical: Not on file  Tobacco Use  . Smoking status: Never Smoker  . Smokeless tobacco: Never Used  Substance and Sexual  Activity  . Alcohol use: No    Comment: 12-27-15 per pt no   . Drug use: No    Comment: 12-27-15 per pt no   . Sexual activity: Never  Lifestyle  . Physical activity:    Days per week: Not on file    Minutes per session: Not on file  . Stress: Not on file  Relationships  . Social connections:    Talks on phone: Not on file    Gets together: Not on file    Attends religious service: Not on file    Active member of club or organization: Not on file    Attends meetings of clubs or organizations: Not on file    Relationship status: Not on file  . Intimate partner violence:    Fear of current or ex partner: Not on file    Emotionally abused: Not on file    Physically abused: Not on file    Forced sexual activity: Not on file  Other Topics Concern  . Not on file  Social History Narrative  . Not on file   Family History  Problem Relation Age of Onset  . Bipolar disorder Father   . Bipolar disorder Sister   . Bipolar disorder Brother   . Bipolar disorder Paternal Uncle   . Alcohol abuse Paternal Uncle   . Bipolar disorder Maternal Grandmother   . Bipolar disorder Paternal Grandfather   . Alcohol abuse Paternal Grandfather   . Bipolar disorder Paternal Grandmother   . Bipolar disorder Brother   . Bipolar disorder Brother     Objective: Office vital signs reviewed. BP 125/86   Pulse 98   Temp 98.4 F (36.9 C) (Oral)   Ht 5\' 1"  (1.549 m)   Wt 153 lb 8 oz (69.6 kg)   BMI 29.00 kg/m   Physical Examination:  General: Awake, alert, well nourished, nontoxic appearing, No acute distress HEENT: Normal    Neck: No masses palpated.  Mildly enlarged anterior cervical lymph nodes bilaterally.    Ears: Tympanic membranes intact, normal light reflex, no erythema, no bulging; scarring appreciated at the left tympanic membrane.    Eyes: PERRLA, extraocular membranes intact, sclera white    Nose: nasal turbinates moist, edematous and erythematous with opaque nasal discharge     Throat: moist mucus membranes, mild oropharyngeal erythema, no tonsillar exudate.  Airway is patent Cardio: regular rate and rhythm, S1S2 heard, no murmurs appreciated Pulm: Air movement is fair.  Clear to auscultation bilaterally, no wheezes, rhonchi or rales; normal work of breathing on room air  Assessment/ Plan: 22 y.o. female   1. Acute bacterial sinusitis Exam clinically consistent with acute bacterial sinusitis.  Start Augmentin 875 p.o. twice daily.  I have care instructions were reviewed.  May continue oral antihistamines.  Reasons for return evaluation discussed.  Patient was  good understanding will follow-up as needed  2. Bronchospasm with bronchitis, acute Start prednisone 40 mg p.o. every morning for 5 days.  Use albuterol inhaler 2 puffs every 6 hours as needed for shortness of breath or wheeze.  Will use consistently for the next 2 days then as needed as directed.  Tessalon Perles p.o. twice daily as needed cough.  May continue prescription antihistamines as above.  Consider adding Mucinex for chest decongestion.    Meds ordered this encounter  Medications  . amoxicillin-clavulanate (AUGMENTIN) 875-125 MG tablet    Sig: Take 1 tablet by mouth 2 (two) times daily.    Dispense:  20 tablet    Refill:  0  . predniSONE (DELTASONE) 20 MG tablet    Sig: Take 2 tablets (40 mg total) by mouth daily with breakfast for 5 days.    Dispense:  10 tablet    Refill:  0  . albuterol (PROVENTIL HFA;VENTOLIN HFA) 108 (90 Base) MCG/ACT inhaler    Sig: Inhale 2 puffs into the lungs every 6 (six) hours as needed for wheezing or shortness of breath.    Dispense:  1 Inhaler    Refill:  0  . benzonatate (TESSALON) 200 MG capsule    Sig: Take 1 capsule (200 mg total) by mouth 2 (two) times daily as needed for cough.    Dispense:  20 capsule    Refill:  0     Vylette Strubel Hulen Skains, DO [facility] [phone]

## 2017-09-05 NOTE — Patient Instructions (Addendum)
Sinusitis, Adult Sinusitis is soreness and inflammation of your sinuses. Sinuses are hollow spaces in the bones around your face. Your sinuses are located:  Around your eyes.  In the middle of your forehead.  Behind your nose.  In your cheekbones.  Your sinuses and nasal passages are lined with a stringy fluid (mucus). Mucus normally drains out of your sinuses. When your nasal tissues become inflamed or swollen, the mucus can become trapped or blocked so air cannot flow through your sinuses. This allows bacteria, viruses, and funguses to grow, which leads to infection. Sinusitis can develop quickly and last for 7?10 days (acute) or for more than 12 weeks (chronic). Sinusitis often develops after a cold. What are the causes? This condition is caused by anything that creates swelling in the sinuses or stops mucus from draining, including:  Allergies.  Asthma.  Bacterial or viral infection.  Abnormally shaped bones between the nasal passages.  Nasal growths that contain mucus (nasal polyps).  Narrow sinus openings.  Pollutants, such as chemicals or irritants in the air.  A foreign object stuck in the nose.  A fungal infection. This is rare.  What increases the risk? The following factors may make you more likely to develop this condition:  Having allergies or asthma.  Having had a recent cold or respiratory tract infection.  Having structural deformities or blockages in your nose or sinuses.  Having a weak immune system.  Doing a lot of swimming or diving.  Overusing nasal sprays.  Smoking.  What are the signs or symptoms? The main symptoms of this condition are pain and a feeling of pressure around the affected sinuses. Other symptoms include:  Upper toothache.  Earache.  Headache.  Bad breath.  Decreased sense of smell and taste.  A cough that may get worse at night.  Fatigue.  Fever.  Thick drainage from your nose. The drainage is often green and  it may contain pus (purulent).  Stuffy nose or congestion.  Postnasal drip. This is when extra mucus collects in the throat or back of the nose.  Swelling and warmth over the affected sinuses.  Sore throat.  Sensitivity to light.  How is this diagnosed? This condition is diagnosed based on symptoms, a medical history, and a physical exam. To find out if your condition is acute or chronic, your health care provider may:  Look in your nose for signs of nasal polyps.  Tap over the affected sinus to check for signs of infection.  View the inside of your sinuses using an imaging device that has a light attached (endoscope).  If your health care provider suspects that you have chronic sinusitis, you may also:  Be tested for allergies.  Have a sample of mucus taken from your nose (nasal culture) and checked for bacteria.  Have a mucus sample examined to see if your sinusitis is related to an allergy.  If your sinusitis does not respond to treatment and it lasts longer than 8 weeks, you may have an MRI or CT scan to check your sinuses. These scans also help to determine how severe your infection is. In rare cases, a bone biopsy may be done to rule out more serious types of fungal sinus disease. How is this treated? Treatment for sinusitis depends on the cause and whether your condition is chronic or acute. If a virus is causing your sinusitis, your symptoms will go away on their own within 10 days. You may be given medicines to relieve your symptoms,   including:  Topical nasal decongestants. They shrink swollen nasal passages and let mucus drain from your sinuses.  Antihistamines. These drugs block inflammation that is triggered by allergies. This can help to ease swelling in your nose and sinuses.  Topical nasal corticosteroids. These are nasal sprays that ease inflammation and swelling in your nose and sinuses.  Nasal saline washes. These rinses can help to get rid of thick mucus in  your nose.  If your condition is caused by bacteria, you will be given an antibiotic medicine. If your condition is caused by a fungus, you will be given an antifungal medicine. Surgery may be needed to correct underlying conditions, such as narrow nasal passages. Surgery may also be needed to remove polyps. Follow these instructions at home: Medicines  Take, use, or apply over-the-counter and prescription medicines only as told by your health care provider. These may include nasal sprays.  If you were prescribed an antibiotic medicine, take it as told by your health care provider. Do not stop taking the antibiotic even if you start to feel better. Hydrate and Humidify  Drink enough water to keep your urine clear or pale yellow. Staying hydrated will help to thin your mucus.  Use a cool mist humidifier to keep the humidity level in your home above 50%.  Inhale steam for 10-15 minutes, 3-4 times a day or as told by your health care provider. You can do this in the bathroom while a hot shower is running.  Limit your exposure to cool or dry air. Rest  Rest as much as possible.  Sleep with your head raised (elevated).  Make sure to get enough sleep each night. General instructions  Apply a warm, moist washcloth to your face 3-4 times a day or as told by your health care provider. This will help with discomfort.  Wash your hands often with soap and water to reduce your exposure to viruses and other germs. If soap and water are not available, use hand sanitizer.  Do not smoke. Avoid being around people who are smoking (secondhand smoke).  Keep all follow-up visits as told by your health care provider. This is important. Contact a health care provider if:  You have a fever.  Your symptoms get worse.  Your symptoms do not improve within 10 days. Get help right away if:  You have a severe headache.  You have persistent vomiting.  You have pain or swelling around your face or  eyes.  You have vision problems.  You develop confusion.  Your neck is stiff.  You have trouble breathing. This information is not intended to replace advice given to you by your health care provider. Make sure you discuss any questions you have with your health care provider. Document Released: 04/29/2005 Document Revised: 12/24/2015 Document Reviewed: 02/22/2015 Elsevier Interactive Patient Education  2018 [employer]  Bronchospasm, Adult Bronchospasm is a tightening of the airways going into the lungs. During an episode, it may be harder to breathe. You may cough, and you may make a whistling sound when you breathe (wheeze). This condition often affects people with asthma. What are the causes? This condition is caused by swelling and irritation in the airways. It can be triggered by:  An infection (common).  Seasonal allergies.  An allergic reaction.  Exercise.  Irritants. These include pollution, cigarette smoke, strong odors, aerosol sprays, and paint fumes.  Weather changes. Winds increase molds and pollens in the air. Cold air may cause swelling.  Stress and emotional upset.    What are the signs or symptoms? Symptoms of this condition include:  Wheezing. If the episode was triggered by an allergy, wheezing may start right away or hours later.  Nighttime coughing.  Frequent or severe coughing with a simple cold.  Chest tightness.  Shortness of breath.  Decreased ability to exercise.  How is this diagnosed? This condition is usually diagnosed with a review of your medical history and a physical exam. Tests, such as lung function tests, are sometimes done to look for other conditions. The need for a chest X-ray depends on where the wheezing occurs and whether it is the first time you have wheezed. How is this treated? This condition may be treated with:  Inhaled medicines. These open up the airways and help you breathe. They can be taken with an inhaler or a  nebulizer device.  Corticosteroid medicines. These may be given for severe bronchospasm, usually when it is associated with asthma.  Avoiding triggers, such as irritants, infection, or allergies.  Follow these instructions at home: Medicines  Take over-the-counter and prescription medicines only as told by your health care provider.  If you need to use an inhaler or nebulizer to take your medicine, ask your health care provider to explain how to use it correctly. If you were given a spacer, always use it with your inhaler. Lifestyle  Reduce the number of triggers in your home. To do this: ? Change your heating and air conditioning filter at least once a month. ? Limit your use of fireplaces and wood stoves. ? Do not smoke. Do not allow smoking in your home. ? Avoid using perfumes and fragrances. ? Get rid of pests, such as roaches and mice, and their droppings. ? Remove any mold from your home. ? Keep your house clean and dust free. Use unscented cleaning products. ? Replace carpet with wood, tile, or vinyl flooring. Carpet can trap dander and dust. ? Use allergy-proof pillows, mattress covers, and box spring covers. ? Wash bed sheets and blankets every week in hot water. Dry them in a dryer. ? Use blankets that are made of polyester or cotton. ? Wash your hands often. ? Do not allow pets in your bedroom.  Avoid breathing in cold air when you exercise. General instructions  Have a plan for seeking medical care. Know when to call your health care provider and local emergency services, and where to get emergency care.  Stay up to date on your immunizations.  When you have an episode of bronchospasm, stay calm. Try to relax and breathe more slowly.  If you have asthma, make sure you have an asthma action plan.  Keep all follow-up visits as told by your health care provider. This is important. Contact a health care provider if:  You have muscle aches.  You have chest  pain.  The mucus that you cough up (sputum) changes from clear or white to yellow, green, gray, or bloody.  You have a fever.  Your sputum gets thicker. Get help right away if:  Your wheezing and coughing get worse, even after you take your prescribed medicines.  It gets even harder to breathe.  You develop severe chest pain. Summary  Bronchospasm is a tightening of the airways going into the lungs.  During an episode of bronchospasm, you may have a harder time breathing. You may cough and make a whistling sound when you breathe (wheeze).  Avoid exposure to triggers such as smoke, dust, mold, animal dander, and fragrances.  When you   have an episode of bronchospasm, stay calm. Try to relax and breathe more slowly. This information is not intended to replace advice given to you by your health care provider. Make sure you discuss any questions you have with your health care provider. Document Released: 05/02/2003 Document Revised: 04/25/2016 Document Reviewed: 04/25/2016 Elsevier Interactive Patient Education  2017 [employer]  

## 2017-09-24 ENCOUNTER — Ambulatory Visit: Payer: Medicaid Other | Admitting: Family Medicine

## 2017-10-20 ENCOUNTER — Other Ambulatory Visit: Payer: Self-pay | Admitting: Family Medicine

## 2017-10-20 ENCOUNTER — Telehealth: Payer: Self-pay | Admitting: Family Medicine

## 2017-10-20 MED ORDER — NORGESTIMATE-ETH ESTRADIOL 0.25-35 MG-MCG PO TABS: 1.0000 | ORAL_TABLET | Freq: Every day | ORAL | 12 refills | Status: DC

## 2017-10-20 NOTE — Telephone Encounter (Signed)
If med has helped at all with menstrual cycles, then I do want her to continue.  I can send refills in.  Let me know.

## 2017-10-21 MED ORDER — NORGESTIMATE-ETH ESTRADIOL 0.25-35 MG-MCG PO TABS: 1.0000 | ORAL_TABLET | Freq: Every day | ORAL | 2 refills | Status: DC

## 2017-10-21 NOTE — Telephone Encounter (Signed)
She reports that it has helped some, so she does want refills.

## 2017-10-21 NOTE — Telephone Encounter (Signed)
Sent in

## 2017-10-21 NOTE — Telephone Encounter (Signed)
Mother aware

## 2017-10-23 ENCOUNTER — Telehealth (HOSPITAL_COMMUNITY): Payer: Self-pay | Admitting: *Deleted

## 2017-10-23 ENCOUNTER — Other Ambulatory Visit (HOSPITAL_COMMUNITY): Payer: Self-pay | Admitting: Psychiatry

## 2017-10-23 ENCOUNTER — Ambulatory Visit (HOSPITAL_COMMUNITY): Payer: Medicaid Other | Admitting: Psychiatry

## 2017-10-23 DIAGNOSIS — F331 Major depressive disorder, recurrent, moderate: Secondary | ICD-10-CM

## 2017-10-23 MED ORDER — AMPHETAMINE-DEXTROAMPHET ER 20 MG PO CP24: 20.0000 mg | ORAL_CAPSULE | Freq: Every day | ORAL | 0 refills | Status: DC

## 2017-10-23 MED ORDER — BUPROPION HCL ER (XL) 300 MG PO TB24: 300.0000 mg | ORAL_TABLET | ORAL | 2 refills | Status: DC

## 2017-10-23 MED ORDER — TRAZODONE HCL 100 MG PO TABS: 100.0000 mg | ORAL_TABLET | Freq: Every day | ORAL | 2 refills | Status: DC

## 2017-10-23 NOTE — Telephone Encounter (Signed)
She is already on adderal xr, all sent

## 2017-10-23 NOTE — Telephone Encounter (Signed)
Dr Tenny Crawoss Appointment was rescheduled per Mom phone tree message gave different appointment time. Mom is requesting Refill on all Medication's. 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 Rescheduled next appointment is 11-18-17

## 2017-11-18 ENCOUNTER — Ambulatory Visit (INDEPENDENT_AMBULATORY_CARE_PROVIDER_SITE_OTHER): Payer: Medicaid Other | Admitting: Psychiatry

## 2017-11-18 ENCOUNTER — Encounter (HOSPITAL_COMMUNITY): Payer: Self-pay | Admitting: Psychiatry

## 2017-11-18 VITALS — BP 114/80 | HR 93 | Ht 61.0 in | Wt 150.0 lb

## 2017-11-18 DIAGNOSIS — F331 Major depressive disorder, recurrent, moderate: Secondary | ICD-10-CM | POA: Diagnosis not present

## 2017-11-18 DIAGNOSIS — F988 Other specified behavioral and emotional disorders with onset usually occurring in childhood and adolescence: Secondary | ICD-10-CM | POA: Diagnosis not present

## 2017-11-18 MED ORDER — AMPHETAMINE-DEXTROAMPHETAMINE 10 MG PO TABS: 10.0000 mg | ORAL_TABLET | Freq: Every day | ORAL | 0 refills | Status: DC

## 2017-11-18 MED ORDER — PAROXETINE HCL 20 MG PO TABS: 20.0000 mg | ORAL_TABLET | Freq: Every day | ORAL | 2 refills | Status: DC

## 2017-11-18 MED ORDER — BUPROPION HCL ER (XL) 300 MG PO TB24: 300.0000 mg | ORAL_TABLET | ORAL | 2 refills | Status: DC

## 2017-11-18 MED ORDER — GABAPENTIN 300 MG PO CAPS: 300.0000 mg | ORAL_CAPSULE | Freq: Three times a day (TID) | ORAL | 2 refills | Status: DC

## 2017-11-18 MED ORDER — AMPHETAMINE-DEXTROAMPHET ER 20 MG PO CP24: 20.0000 mg | ORAL_CAPSULE | Freq: Every day | ORAL | 0 refills | Status: DC

## 2017-11-18 MED ORDER — TRAZODONE HCL 100 MG PO TABS: 100.0000 mg | ORAL_TABLET | Freq: Every day | ORAL | 2 refills | Status: DC

## 2017-11-18 NOTE — Progress Notes (Signed)
BH MD/PA/NP OP Progress Note  11/18/2017 3:06 PM Samantha Tucker  MRN:  [id number]  Chief Complaint:  Chief Complaint    Depression; Anxiety; ADD; Follow-up     HPI: Patient is a 22 year old white female who lives with her family in [place].  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  She is planning to start college at community college in the fall.  The patient returns for follow-up regarding depression anxiety and ADHD.  She states that lately she has been very angry irritable and depressed.  She is not suicidal but gets mad at the least little thing.  Apparently her thyroid tests were abnormal although I checked in her TSH was elevated and T3 and T4 were normal.  She has been referred to endocrinology and this may have something to do with her mood changing and her irregular menses.  She does think the Wellbutrin is helping to some degree but I suggested we add an SSRI such as Paxil to help calm her down and help the anxiety.  I also strongly suggested she take the gabapentin 3 times daily to help the anxiety.  She is sleeping well and the Adderall is keeping her more focused she requests getting back on Adderall after lunch since she is starting college soon Visit Diagnosis:    ICD-10-CM   1. 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 Major depressive disorder, recurrent episode, moderate (HCC) F33.1 buPROPion (WELLBUTRIN XL) 300 MG 24 hr tablet    Past Psychiatric History: Long-term outpatient treatment  Past Medical History:  Past Medical History:  Diagnosis Date  . ADHD (attention deficit hyperactivity disorder)   . Allergy   . Anxiety   . Constipation   . Environmental allergies   . Nausea   . Postural hypotension     Past Surgical History:  Procedure Laterality Date  . TYMPANOSTOMY TUBE PLACEMENT Bilateral [id number]    Family Psychiatric History: See below  Family History:  Family History  Problem Relation Age of Onset  . Bipolar  disorder Father   . Bipolar disorder Sister   . Bipolar disorder Brother   . Bipolar disorder Paternal Uncle   . Alcohol abuse Paternal Uncle   . Bipolar disorder Maternal Grandmother   . Bipolar disorder Paternal Grandfather   . Alcohol abuse Paternal Grandfather   . Bipolar disorder Paternal Grandmother   . Bipolar disorder Brother   . Bipolar disorder Brother     Social History:  Social History   Socioeconomic History  . Marital status: Single    Spouse name: Not on file  . Number of children: Not on file  . Years of education: Not on file  . Highest education level: Not on file  Occupational History  . Not on file  Social Needs  . Financial resource strain: Not on file  . Food insecurity:    Worry: Not on file    Inability: Not on file  . Transportation needs:    Medical: Not on file    Non-medical: Not on file  Tobacco Use  . Smoking status: Never Smoker  . Smokeless tobacco: Never Used  Substance and Sexual Activity  . Alcohol use: No    Comment: 12-27-15 per pt no   . Drug use: No    Comment: 12-27-15 per pt no   . Sexual activity: Never  Lifestyle  . Physical activity:    Days per week: Not on file    Minutes per  session: Not on file  . Stress: Not on file  Relationships  . Social connections:    Talks on phone: Not on file    Gets together: Not on file    Attends religious service: Not on file    Active member of club or organization: Not on file    Attends meetings of clubs or organizations: Not on file    Relationship status: Not on file  Other Topics Concern  . Not on file  Social History Narrative  . Not on file    Allergies:  Allergies  Allergen Reactions  . Other Hives and Shortness Of Breath    Develops hives with exposure to cold air.   . Claritin [Loratadine]     Fainting spells  . Sulfa Antibiotics   . Latex Hives    Metabolic Disorder Labs: No results found for: HGBA1C, MPG No results found for: PROLACTIN No results found  for: CHOL, TRIG, HDL, CHOLHDL, VLDL, LDLCALC Lab Results  Component Value Date   TSH 4.930 (H) 08/22/2017   TSH 5.110 (H) 07/22/2017    Therapeutic Level Labs: No results found for: LITHIUM No results found for: VALPROATE No components found for:  CBMZ  Current Medications: Current Outpatient Medications  Medication Sig Dispense Refill  . albuterol (PROVENTIL HFA;VENTOLIN HFA) 108 (90 Base) MCG/ACT inhaler Inhale 2 puffs into the lungs every 6 (six) hours as needed for wheezing or shortness of breath. 1 Inhaler 0  . amoxicillin-clavulanate (AUGMENTIN) 875-125 MG tablet Take 1 tablet by mouth 2 (two) times daily. 20 tablet 0  . amphetamine-dextroamphetamine (ADDERALL XR) 20 MG 24 hr capsule Take 1 capsule (20 mg total) by mouth daily. 30 capsule 0  . amphetamine-dextroamphetamine (ADDERALL XR) 20 MG 24 hr capsule Take 1 capsule (20 mg total) by mouth daily. 30 capsule 0  . amphetamine-dextroamphetamine (ADDERALL XR) 20 MG 24 hr capsule Take 1 capsule (20 mg total) by mouth daily. 30 capsule 0  . benzonatate (TESSALON) 200 MG capsule Take 1 capsule (200 mg total) by mouth 2 (two) times daily as needed for cough. 20 capsule 0  . buPROPion (WELLBUTRIN XL) 300 MG 24 hr tablet Take 1 tablet (300 mg total) by mouth every morning. 90 tablet 2  . desloratadine (CLARINEX) 5 MG tablet Take 5 mg by mouth daily.    . fluticasone (FLONASE) 50 MCG/ACT nasal spray Place 2 sprays into both nostrils daily. 16 g 4  . gabapentin (NEURONTIN) 300 MG capsule Take 1 capsule (300 mg total) by mouth 3 (three) times daily. 270 capsule 2  . montelukast (SINGULAIR) 10 MG tablet Take 10 mg by mouth at bedtime.    . naproxen (NAPROSYN) 500 MG tablet Take 1 tablet (500 mg total) by mouth 2 (two) times daily with a meal. As needed for menstrual cramping. 30 tablet 0  . norgestimate-ethinyl estradiol (ORTHO-CYCLEN, 28,) 0.25-35 MG-MCG tablet Take 1 tablet by mouth daily. 1 Package 2  . norgestimate-ethinyl estradiol  (ORTHO-CYCLEN, 28,) 0.25-35 MG-MCG tablet Take 1 tablet by mouth daily. 1 Package 12  . Olopatadine HCl (PATANASE NA) Place into the nose daily. Reported on 05/26/2015    . ondansetron (ZOFRAN) 4 MG tablet Take 1 tablet (4 mg total) by mouth every 8 (eight) hours as needed for nausea or vomiting. 30 tablet 1  . traZODone (DESYREL) 100 MG tablet Take 1 tablet (100 mg total) by mouth at bedtime. 90 tablet 2  . amphetamine-dextroamphetamine (ADDERALL) 10 MG tablet Take 1 tablet (10 mg total) by  mouth daily. 30 tablet 0  . amphetamine-dextroamphetamine (ADDERALL) 10 MG tablet Take 1 tablet (10 mg total) by mouth daily. 30 tablet 0  . amphetamine-dextroamphetamine (ADDERALL) 10 MG tablet Take 1 tablet (10 mg total) by mouth daily. 30 tablet 0  . PARoxetine (PAXIL) 20 MG tablet Take 1 tablet (20 mg total) by mouth daily. 30 tablet 2   No current facility-administered medications for this visit.      Musculoskeletal: Strength & Muscle Tone: within normal limits Gait & Station: normal Patient leans: N/A  Psychiatric Specialty Exam: Review of Systems  Psychiatric/Behavioral: Positive for depression. The patient is nervous/anxious.   All other systems reviewed and are negative.   Blood pressure 114/80, pulse 93, height 5\' 1"  (1.549 m), weight 150 lb (68 kg), SpO2 98 %.Body mass index is 28.34 kg/m.  General Appearance: Casual and Fairly Groomed  Eye Contact:  Good  Speech:  Clear and Coherent  Volume:  Normal  Mood:  Anxious  Affect:  Constricted  Thought Process:  Goal Directed  Orientation:  Full (Time, Place, and Person)  Thought Content: Rumination   Suicidal Thoughts:  No  Homicidal Thoughts:  No  Memory:  Immediate;   Good Recent;   Good Remote;   Good  Judgement:  Fair  Insight:  Fair  Psychomotor Activity:  Normal  Concentration:  Concentration: Good and Attention Span: Good  Recall:  Good  Fund of Knowledge: Good  Language: Good  Akathisia:  No  Handed:  Right  AIMS (if  indicated): not done  Assets:  Communication Skills Desire for Improvement Physical Health Resilience Social Support Talents/Skills  ADL's:  Intact  Cognition: WNL  Sleep:  Good   Screenings: PHQ2-9     Office Visit from 08/22/2017 in [facility] Office Visit from 08/27/2016 in [facility] Clinical Support from 11/01/2015 in [facility] Office Visit from 05/28/2013 in [facility]  PHQ-2 Total Score  2  0  0  0  PHQ-9 Total Score  9  -  -  -       Assessment and Plan: This patient is a 22 year old female with a history of depression anxiety and ADD.  There may be an issue regarding her thyroid as well and this is going to be investigated.  In the meantime she will continue Wellbutrin XL 300 mg daily.  We will add Paxil 20 mg daily.  She will continue gabapentin 300 mg 3 times daily for anxiety, Adderall XR 20 mg every morning for ADD and add Adderall 10 mg after lunch daily.  She will return to see me in 6 weeks   Diannia Ruder, MD 11/18/2017, 3:06 PM

## 2017-12-11 ENCOUNTER — Ambulatory Visit (INDEPENDENT_AMBULATORY_CARE_PROVIDER_SITE_OTHER): Payer: Medicaid Other | Admitting: Endocrinology

## 2017-12-11 ENCOUNTER — Encounter: Payer: Self-pay | Admitting: Endocrinology

## 2017-12-11 VITALS — BP 118/80 | HR 96 | Temp 98.4°F | Ht 61.0 in | Wt 148.0 lb

## 2017-12-11 DIAGNOSIS — R7989 Other specified abnormal findings of blood chemistry: Secondary | ICD-10-CM | POA: Diagnosis not present

## 2017-12-11 MED ORDER — LEVOTHYROXINE SODIUM 25 MCG PO TABS: 25.0000 ug | ORAL_TABLET | Freq: Every day | ORAL | 3 refills | Status: DC

## 2017-12-11 NOTE — Patient Instructions (Addendum)
In view of your medical condition, you should avoid pregnancy until we have decided it is safe I have sent a prescription to your pharmacy, to start a thyroid hormone pill.  Taking this does not cause you to become dependent on it, but most people need it permanently. Please redo the blood test in 1 month Please come back for a follow-up appointment in 1 month.      Hypothyroidism Hypothyroidism is a disorder of the thyroid. The thyroid is a large gland that is located in the lower front of the neck. The thyroid releases hormones that control how the body works. With hypothyroidism, the thyroid does not make enough of these hormones. What are the causes? Causes of hypothyroidism may include:  Viral infections.  Pregnancy.  Your own defense system (immune system) attacking your thyroid.  Certain medicines.  Birth defects.  Past radiation treatments to your head or neck.  Past treatment with radioactive iodine.  Past surgical removal of part or all of your thyroid.  Problems with the gland that is located in the center of your brain (pituitary).  What are the signs or symptoms? Signs and symptoms of hypothyroidism may include:  Feeling as though you have no energy (lethargy).  Inability to tolerate cold.  Weight gain that is not explained by a change in diet or exercise habits.  Dry skin.  Coarse hair.  Menstrual irregularity.  Slowing of thought processes.  Constipation.  Sadness or depression.  How is this diagnosed? Your health care provider may diagnose hypothyroidism with blood tests and ultrasound tests. How is this treated? Hypothyroidism is treated with medicine that replaces the hormones that your body does not make. After you begin treatment, it may take several weeks for symptoms to go away. Follow these instructions at home:  Take medicines only as directed by your health care provider.  If you start taking any new medicines, tell your health  care provider.  Keep all follow-up visits as directed by your health care provider. This is important. As your condition improves, your dosage needs may change. You will need to have blood tests regularly so that your health care provider can watch your condition. Contact a health care provider if:  Your symptoms do not get better with treatment.  You are taking thyroid replacement medicine and: ? You sweat excessively. ? You have tremors. ? You feel anxious. ? You lose weight rapidly. ? You cannot tolerate heat. ? You have emotional swings. ? You have diarrhea. ? You feel weak. Get help right away if:  You develop chest pain.  You develop an irregular heartbeat.  You develop a rapid heartbeat. This information is not intended to replace advice given to you by your health care provider. Make sure you discuss any questions you have with your health care provider. Document Released: 04/29/2005 Document Revised: 10/05/2015 Document Reviewed: 09/14/2013 Elsevier Interactive Patient Education  2018 [employer]

## 2017-12-11 NOTE — Progress Notes (Signed)
Subjective:    Patient ID: Samantha Tucker, female    DOB: 12-Mar-1996, 22 y.o.   MRN: [id number]  HPI Pt is referred by Dr Nadine Counts, for hypothyroidism.  Pt reports hypothyroidism was dx'ed in early 2019.  she has never been on prescribed thyroid hormone therapy.  she has never taken kelp or any other type of non-prescribed thyroid product.  she has never had thyroid imaging.  She is not considering a pregnancy.  she has never had thyroid surgery, or XRT to the neck.  she has never been on amiodarone or lithium.  She reports intermitt lightheadedness, and assoc heavy menses.   Past Medical History:  Diagnosis Date  . ADHD (attention deficit hyperactivity disorder)   . Allergy   . Anxiety   . Constipation   . Environmental allergies   . Nausea   . Postural hypotension     Past Surgical History:  Procedure Laterality Date  . TYMPANOSTOMY TUBE PLACEMENT Bilateral [id number]    Social History   Socioeconomic History  . Marital status: Single    Spouse name: Not on file  . Number of children: Not on file  . Years of education: Not on file  . Highest education level: Not on file  Occupational History  . Not on file  Social Needs  . Financial resource strain: Not on file  . Food insecurity:    Worry: Not on file    Inability: Not on file  . Transportation needs:    Medical: Not on file    Non-medical: Not on file  Tobacco Use  . Smoking status: Never Smoker  . Smokeless tobacco: Never Used  Substance and Sexual Activity  . Alcohol use: No    Comment: 12-27-15 per pt no   . Drug use: No    Comment: 12-27-15 per pt no   . Sexual activity: Never  Lifestyle  . Physical activity:    Days per week: Not on file    Minutes per session: Not on file  . Stress: Not on file  Relationships  . Social connections:    Talks on phone: Not on file    Gets together: Not on file    Attends religious service: Not on file    Active member of club or organization: Not on file    Attends  meetings of clubs or organizations: Not on file    Relationship status: Not on file  . Intimate partner violence:    Fear of current or ex partner: Not on file    Emotionally abused: Not on file    Physically abused: Not on file    Forced sexual activity: Not on file  Other Topics Concern  . Not on file  Social History Narrative  . Not on file    Current Outpatient Medications on File Prior to Visit  Medication Sig Dispense Refill  . albuterol (PROVENTIL HFA;VENTOLIN HFA) 108 (90 Base) MCG/ACT inhaler Inhale 2 puffs into the lungs every 6 (six) hours as needed for wheezing or shortness of breath. 1 Inhaler 0  . amphetamine-dextroamphetamine (ADDERALL XR) 20 MG 24 hr capsule Take 1 capsule (20 mg total) by mouth daily. 30 capsule 0  . amphetamine-dextroamphetamine (ADDERALL XR) 20 MG 24 hr capsule Take 1 capsule (20 mg total) by mouth daily. 30 capsule 0  . amphetamine-dextroamphetamine (ADDERALL XR) 20 MG 24 hr capsule Take 1 capsule (20 mg total) by mouth daily. 30 capsule 0  . amphetamine-dextroamphetamine (ADDERALL) 10 MG tablet Take 1  tablet (10 mg total) by mouth daily. 30 tablet 0  . amphetamine-dextroamphetamine (ADDERALL) 10 MG tablet Take 1 tablet (10 mg total) by mouth daily. 30 tablet 0  . amphetamine-dextroamphetamine (ADDERALL) 10 MG tablet Take 1 tablet (10 mg total) by mouth daily. 30 tablet 0  . buPROPion (WELLBUTRIN XL) 300 MG 24 hr tablet Take 1 tablet (300 mg total) by mouth every morning. 90 tablet 2  . desloratadine (CLARINEX) 5 MG tablet Take 5 mg by mouth daily.    . fluticasone (FLONASE) 50 MCG/ACT nasal spray Place 2 sprays into both nostrils daily. 16 g 4  . gabapentin (NEURONTIN) 300 MG capsule Take 1 capsule (300 mg total) by mouth 3 (three) times daily. 270 capsule 2  . montelukast (SINGULAIR) 10 MG tablet Take 10 mg by mouth at bedtime.    . naproxen (NAPROSYN) 500 MG tablet Take 1 tablet (500 mg total) by mouth 2 (two) times daily with a meal. As needed for  menstrual cramping. 30 tablet 0  . norgestimate-ethinyl estradiol (ORTHO-CYCLEN, 28,) 0.25-35 MG-MCG tablet Take 1 tablet by mouth daily. 1 Package 2  . norgestimate-ethinyl estradiol (ORTHO-CYCLEN, 28,) 0.25-35 MG-MCG tablet Take 1 tablet by mouth daily. 1 Package 12  . Olopatadine HCl (PATANASE NA) Place into the nose daily. Reported on 05/26/2015    . ondansetron (ZOFRAN) 4 MG tablet Take 1 tablet (4 mg total) by mouth every 8 (eight) hours as needed for nausea or vomiting. 30 tablet 1  . PARoxetine (PAXIL) 20 MG tablet Take 1 tablet (20 mg total) by mouth daily. 30 tablet 2  . traZODone (DESYREL) 100 MG tablet Take 1 tablet (100 mg total) by mouth at bedtime. 90 tablet 2  . ampicillin (PRINCIPEN) 500 MG capsule TK 1 C PO D  2  . clindamycin-benzoyl peroxide (BENZACLIN) gel APPLY ON THE SKIN DAILY TO FACE AT NIGHT  3  . Olopatadine HCl 0.6 % SOLN U 2 SPRAYS IEN BID  1   No current facility-administered medications on file prior to visit.     Allergies  Allergen Reactions  . Other Hives and Shortness Of Breath    Develops hives with exposure to cold air.   . Claritin [Loratadine]     Fainting spells  . Sulfa Antibiotics   . Latex Hives    Family History  Problem Relation Age of Onset  . Bipolar disorder Father   . Bipolar disorder Sister   . Bipolar disorder Brother   . Thyroid disease Brother   . Bipolar disorder Paternal Uncle   . Alcohol abuse Paternal Uncle   . Bipolar disorder Maternal Grandmother   . Bipolar disorder Paternal Grandfather   . Alcohol abuse Paternal Grandfather   . Bipolar disorder Paternal Grandmother   . Bipolar disorder Brother   . Bipolar disorder Brother     BP 118/80 (BP Location: Left Arm, Patient Position: Sitting, Cuff Size: Normal)   Pulse 96   Temp 98.4 F (36.9 C) (Oral)   Ht 5\' 1"  (1.549 m)   Wt 148 lb (67.1 kg)   LMP 12/10/2017   SpO2 97%   BMI 27.96 kg/m     Review of Systems denies hair loss, sob, weight gain, constipation,  numbness, blurry vision, cold intolerance, dry skin, rhinorrhea, and easy bruising.  She has menstrual cramps, constipation, difficulty with concentration, depression, leg cramps, myalgias, and fatigue.  Last syncopal episode was last week.        Objective:   Physical Exam VS: see vs  page GEN: no distress HEAD: head: no deformity eyes: no periorbital swelling, no proptosis external nose and ears are normal mouth: no lesion seen NECK: supple, thyroid is not enlarged CHEST WALL: no deformity LUNGS: clear to auscultation CV: reg rate and rhythm, no murmur ABD: abdomen is soft, nontender.  no hepatosplenomegaly.  not distended.  no hernia MUSCULOSKELETAL: muscle bulk and strength are grossly normal.  no obvious joint swelling.  gait is normal and steady EXTEMITIES: no deformity.  no ulcer on the feet.  feet are of normal color and temp.  no edema PULSES: dorsalis pedis intact bilat.  no carotid bruit NEURO:  cn 2-12 grossly intact.   readily moves all 4's.  sensation is intact to touch on the feet SKIN:  Normal texture and temperature.  No rash or suspicious lesion is visible.   NODES:  None palpable at the neck.  PSYCH: alert, well-oriented.  Does not appear anxious nor depressed.    Lab Results  Component Value Date   TSH 4.930 (H) 08/22/2017   T3TOTAL 157 08/22/2017   T4TOTAL 6.6 05/28/2013    I have reviewed outside records, and summarized: Pt was noted to have elevated TSH, and referred here.  Main problem addressed was acute sinusitis.  [facility]     Assessment & Plan:  Hypothyroidism: new to me.  Usually due to chronic thyroiditis.     Patient Instructions  In view of your medical condition, you should avoid pregnancy until we have decided it is safe I have sent a prescription to your pharmacy, to start a thyroid hormone pill.  Taking this does not cause you to become dependent on it, but most people need it permanently. Please redo the blood test  in 1 month Please come back for a follow-up appointment in 1 month.      Hypothyroidism Hypothyroidism is a disorder of the thyroid. The thyroid is a large gland that is located in the lower front of the neck. The thyroid releases hormones that control how the body works. With hypothyroidism, the thyroid does not make enough of these hormones. What are the causes? Causes of hypothyroidism may include:  Viral infections.  Pregnancy.  Your own defense system (immune system) attacking your thyroid.  Certain medicines.  Birth defects.  Past radiation treatments to your head or neck.  Past treatment with radioactive iodine.  Past surgical removal of part or all of your thyroid.  Problems with the gland that is located in the center of your brain (pituitary).  What are the signs or symptoms? Signs and symptoms of hypothyroidism may include:  Feeling as though you have no energy (lethargy).  Inability to tolerate cold.  Weight gain that is not explained by a change in diet or exercise habits.  Dry skin.  Coarse hair.  Menstrual irregularity.  Slowing of thought processes.  Constipation.  Sadness or depression.  How is this diagnosed? Your health care provider may diagnose hypothyroidism with blood tests and ultrasound tests. How is this treated? Hypothyroidism is treated with medicine that replaces the hormones that your body does not make. After you begin treatment, it may take several weeks for symptoms to go away. Follow these instructions at home:  Take medicines only as directed by your health care provider.  If you start taking any new medicines, tell your health care provider.  Keep all follow-up visits as directed by your health care provider. This is important. As your condition improves, your dosage  needs may change. You will need to have blood tests regularly so that your health care provider can watch your condition. Contact a health care provider  if:  Your symptoms do not get better with treatment.  You are taking thyroid replacement medicine and: ? You sweat excessively. ? You have tremors. ? You feel anxious. ? You lose weight rapidly. ? You cannot tolerate heat. ? You have emotional swings. ? You have diarrhea. ? You feel weak. Get help right away if:  You develop chest pain.  You develop an irregular heartbeat.  You develop a rapid heartbeat. This information is not intended to replace advice given to you by your health care provider. Make sure you discuss any questions you have with your health care provider. Document Released: 04/29/2005 Document Revised: 10/05/2015 Document Reviewed: 09/14/2013 Elsevier Interactive Patient Education  2018 [employer].

## 2017-12-18 DIAGNOSIS — R05 Cough: Secondary | ICD-10-CM | POA: Diagnosis not present

## 2017-12-18 DIAGNOSIS — J301 Allergic rhinitis due to pollen: Secondary | ICD-10-CM | POA: Diagnosis not present

## 2017-12-18 DIAGNOSIS — J3081 Allergic rhinitis due to animal (cat) (dog) hair and dander: Secondary | ICD-10-CM | POA: Diagnosis not present

## 2017-12-18 DIAGNOSIS — L503 Dermatographic urticaria: Secondary | ICD-10-CM | POA: Diagnosis not present

## 2017-12-30 ENCOUNTER — Ambulatory Visit (INDEPENDENT_AMBULATORY_CARE_PROVIDER_SITE_OTHER): Payer: Medicaid Other | Admitting: Psychiatry

## 2017-12-30 ENCOUNTER — Encounter (HOSPITAL_COMMUNITY): Payer: Self-pay | Admitting: Psychiatry

## 2017-12-30 VITALS — BP 116/78 | HR 95 | Ht 61.0 in | Wt 148.0 lb

## 2017-12-30 DIAGNOSIS — Z811 Family history of alcohol abuse and dependence: Secondary | ICD-10-CM | POA: Diagnosis not present

## 2017-12-30 DIAGNOSIS — F331 Major depressive disorder, recurrent, moderate: Secondary | ICD-10-CM | POA: Diagnosis not present

## 2017-12-30 DIAGNOSIS — F988 Other specified behavioral and emotional disorders with onset usually occurring in childhood and adolescence: Secondary | ICD-10-CM

## 2017-12-30 DIAGNOSIS — L700 Acne vulgaris: Secondary | ICD-10-CM | POA: Diagnosis not present

## 2017-12-30 DIAGNOSIS — R42 Dizziness and giddiness: Secondary | ICD-10-CM | POA: Diagnosis not present

## 2017-12-30 DIAGNOSIS — Z818 Family history of other mental and behavioral disorders: Secondary | ICD-10-CM | POA: Diagnosis not present

## 2017-12-30 MED ORDER — PAROXETINE HCL 20 MG PO TABS: 20.0000 mg | ORAL_TABLET | Freq: Every day | ORAL | 2 refills | Status: DC

## 2017-12-30 MED ORDER — AMPHETAMINE-DEXTROAMPHETAMINE 10 MG PO TABS: 10.0000 mg | ORAL_TABLET | Freq: Every day | ORAL | 0 refills | Status: DC

## 2017-12-30 MED ORDER — AMPHETAMINE-DEXTROAMPHET ER 20 MG PO CP24: 20.0000 mg | ORAL_CAPSULE | Freq: Every day | ORAL | 0 refills | Status: DC

## 2017-12-30 MED ORDER — GABAPENTIN 300 MG PO CAPS: 300.0000 mg | ORAL_CAPSULE | Freq: Three times a day (TID) | ORAL | 2 refills | Status: DC

## 2017-12-30 MED ORDER — TRAZODONE HCL 100 MG PO TABS: 100.0000 mg | ORAL_TABLET | Freq: Every day | ORAL | 2 refills | Status: DC

## 2017-12-30 NOTE — Progress Notes (Signed)
BH MD/PA/NP OP Progress Note  12/30/2017 3:24 PM Samantha Tucker  MRN:  [id number]  Chief Complaint:  Chief Complaint    Depression; Anxiety; ADHD; Follow-up     HPI: This patient is a 22 year old white female who lives with her family in [place].  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  She was planning to start college at the community college but is putting it off for a while.  The patient returns after 6 weeks regarding depression anxiety and ADHD.  She is here with her mother.  She has been seen by endocrinology and has been started on low-dose Synthroid.  She has not seen a huge change yet.  She is still dealing with pots syndrome and sometimes feels very lightheaded.  Overall however since getting back on medication for depression and anxiety her mood has improved.  She is feeling much less anxious.  Most of the time she sleeps well but lately she has been waking up through the night and her mother is started her on melatonin which has helped.  She does not always take trazodone.  Eyes any thoughts of self-harm and seems to be much more content with her current life.  Her mother states that the family has to move because of mortgage issues and this is why the patient is putting off starting college right now. Visit Diagnosis:    ICD-10-CM   1. Major depressive disorder, recurrent episode, moderate (HCC) F33.1   2. 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 ADHD (attention deficit hyperactivity disorder)   . Allergy   . Anxiety   . Constipation   . Environmental allergies   . Nausea   . Postural hypotension     Past Surgical History:  Procedure Laterality Date  . TYMPANOSTOMY TUBE PLACEMENT Bilateral [id number]    Family Psychiatric History: See below  Family History:  Family History  Problem  Relation Age of Onset  . Bipolar disorder Father   . Bipolar disorder Sister   . Bipolar disorder Brother   . Thyroid disease Brother   . Bipolar disorder Paternal Uncle   . Alcohol abuse Paternal Uncle   . Bipolar disorder Maternal Grandmother   . Bipolar disorder Paternal Grandfather   . Alcohol abuse Paternal Grandfather   . Bipolar disorder Paternal Grandmother   . Bipolar disorder Brother   . Bipolar disorder Brother     Social History:  Social History   Socioeconomic History  . Marital status: Single    Spouse name: Not on file  . Number of children: Not on file  . Years of education: Not on file  . Highest education level: Not on file  Occupational History  . Not on file  Social Needs  . Financial resource strain: Not on file  . Food insecurity:    Worry: Not on file    Inability: Not on file  . Transportation needs:    Medical: Not on file    Non-medical: Not on file  Tobacco Use  . Smoking status: Never Smoker  . Smokeless tobacco: Never Used  Substance and Sexual Activity  . Alcohol use: No    Comment: 12-27-15 per pt no   . Drug use: No    Comment: 12-27-15 per pt no   . Sexual activity: Never  Lifestyle  . Physical activity:  Days per week: Not on file    Minutes per session: Not on file  . Stress: Not on file  Relationships  . Social connections:    Talks on phone: Not on file    Gets together: Not on file    Attends religious service: Not on file    Active member of club or organization: Not on file    Attends meetings of clubs or organizations: Not on file    Relationship status: Not on file  Other Topics Concern  . Not on file  Social History Narrative  . Not on file    Allergies:  Allergies  Allergen Reactions  . Other Hives and Shortness Of Breath    Develops hives with exposure to cold air.   . Claritin [Loratadine]     Fainting spells  . Sulfa Antibiotics   . Latex Hives    Metabolic Disorder Labs: No results found for:  HGBA1C, MPG No results found for: PROLACTIN No results found for: CHOL, TRIG, HDL, CHOLHDL, VLDL, LDLCALC Lab Results  Component Value Date   TSH 4.930 (H) 08/22/2017   TSH 5.110 (H) 07/22/2017    Therapeutic Level Labs: No results found for: LITHIUM No results found for: VALPROATE No components found for:  CBMZ  Current Medications: Current Outpatient Medications  Medication Sig Dispense Refill  . albuterol (PROVENTIL HFA;VENTOLIN HFA) 108 (90 Base) MCG/ACT inhaler Inhale 2 puffs into the lungs every 6 (six) hours as needed for wheezing or shortness of breath. 1 Inhaler 0  . amphetamine-dextroamphetamine (ADDERALL XR) 20 MG 24 hr capsule Take 1 capsule (20 mg total) by mouth daily. 30 capsule 0  . amphetamine-dextroamphetamine (ADDERALL XR) 20 MG 24 hr capsule Take 1 capsule (20 mg total) by mouth daily. 30 capsule 0  . amphetamine-dextroamphetamine (ADDERALL XR) 20 MG 24 hr capsule Take 1 capsule (20 mg total) by mouth daily. 30 capsule 0  . amphetamine-dextroamphetamine (ADDERALL) 10 MG tablet Take 1 tablet (10 mg total) by mouth daily. 30 tablet 0  . amphetamine-dextroamphetamine (ADDERALL) 10 MG tablet Take 1 tablet (10 mg total) by mouth daily. 30 tablet 0  . amphetamine-dextroamphetamine (ADDERALL) 10 MG tablet Take 1 tablet (10 mg total) by mouth daily. 30 tablet 0  . ampicillin (PRINCIPEN) 500 MG capsule TK 1 C PO D  2  . buPROPion (WELLBUTRIN XL) 300 MG 24 hr tablet Take 1 tablet (300 mg total) by mouth every morning. 90 tablet 2  . clindamycin-benzoyl peroxide (BENZACLIN) gel APPLY ON THE SKIN DAILY TO FACE AT NIGHT  3  . desloratadine (CLARINEX) 5 MG tablet Take 5 mg by mouth daily.    . fluticasone (FLONASE) 50 MCG/ACT nasal spray Place 2 sprays into both nostrils daily. 16 g 4  . gabapentin (NEURONTIN) 300 MG capsule Take 1 capsule (300 mg total) by mouth 3 (three) times daily. 270 capsule 2  . levothyroxine (SYNTHROID, LEVOTHROID) 25 MCG tablet Take 1 tablet (25 mcg  total) by mouth daily before breakfast. 90 tablet 3  . montelukast (SINGULAIR) 10 MG tablet Take 10 mg by mouth at bedtime.    . naproxen (NAPROSYN) 500 MG tablet Take 1 tablet (500 mg total) by mouth 2 (two) times daily with a meal. As needed for menstrual cramping. 30 tablet 0  . norgestimate-ethinyl estradiol (ORTHO-CYCLEN, 28,) 0.25-35 MG-MCG tablet Take 1 tablet by mouth daily. 1 Package 2  . norgestimate-ethinyl estradiol (ORTHO-CYCLEN, 28,) 0.25-35 MG-MCG tablet Take 1 tablet by mouth daily. 1 Package 12  .  Olopatadine HCl (PATANASE NA) Place into the nose daily. Reported on 05/26/2015    . Olopatadine HCl 0.6 % SOLN U 2 SPRAYS IEN BID  1  . ondansetron (ZOFRAN) 4 MG tablet Take 1 tablet (4 mg total) by mouth every 8 (eight) hours as needed for nausea or vomiting. 30 tablet 1  . PARoxetine (PAXIL) 20 MG tablet Take 1 tablet (20 mg total) by mouth daily. 30 tablet 2  . traZODone (DESYREL) 100 MG tablet Take 1 tablet (100 mg total) by mouth at bedtime. 90 tablet 2   No current facility-administered medications for this visit.      Musculoskeletal: Strength & Muscle Tone: within normal limits Gait & Station: normal Patient leans: N/A  Psychiatric Specialty Exam: Review of Systems  Neurological: Positive for dizziness.  All other systems reviewed and are negative.   Blood pressure 116/78, pulse 95, height 5\' 1"  (1.549 m), weight 148 lb (67.1 kg), last menstrual period 12/10/2017, SpO2 99 %.Body mass index is 27.96 kg/m.  General Appearance: Casual and Fairly Groomed  Eye Contact:  Fair  Speech:  Clear and Coherent  Volume:  Normal  Mood:  Euthymic  Affect:  Congruent  Thought Process:  Goal Directed  Orientation:  Full (Time, Place, and Person)  Thought Content: WDL   Suicidal Thoughts:  No  Homicidal Thoughts:  No  Memory:  Immediate;   Good Recent;   Good Remote;   Good  Judgement:  Fair  Insight:  Fair  Psychomotor Activity:  Normal  Concentration:  Concentration:  Good and Attention Span: Good  Recall:  Good  Fund of Knowledge: Good  Language: Good  Akathisia:  No  Handed:  Right  AIMS (if indicated): not done  Assets:  Communication Skills Desire for Improvement Physical Health Resilience Social Support Talents/Skills  ADL's:  Intact  Cognition: WNL  Sleep:  Fair   Screenings: PHQ2-9     Office Visit from 08/22/2017 in [facility] Office Visit from 08/27/2016 in [facility] Clinical Support [facility]/2015 [facility] Visit from 05/28/2013 in [facility]  PHQ-2 Total Score  2  0  0  0  PHQ-9 Total Score  9  -  -  -       Assessment and Plan: This patient is a 22 year old female with a history of depression and anxiety.  She seems to be doing better since we added Paxil to her regimen.  She will continue trazodone 100 mg at bedtime as needed for sleep, Paxil 20 mg daily for depression along with Wellbutrin XL 300 mg nightly for depression, gabapentin 300 mg 3 times daily for anxiety, and Adderall XR 20 mg every morning and Adderall 10 mg in the afternoon for ADD.  She will return to see me in 3 months   Diannia Rudereborah Liley Rake, MD 12/30/2017, 3:24 PM

## 2018-01-14 ENCOUNTER — Telehealth: Payer: Self-pay | Admitting: Family Medicine

## 2018-01-14 ENCOUNTER — Other Ambulatory Visit (INDEPENDENT_AMBULATORY_CARE_PROVIDER_SITE_OTHER): Payer: Medicaid Other

## 2018-01-14 DIAGNOSIS — R7989 Other specified abnormal findings of blood chemistry: Secondary | ICD-10-CM

## 2018-01-14 LAB — TSH: TSH: 3.08 u[IU]/mL (ref 0.35–5.50)

## 2018-01-14 NOTE — Telephone Encounter (Signed)
Aware of other option.

## 2018-01-14 NOTE — Telephone Encounter (Signed)
Please advise 

## 2018-01-14 NOTE — Telephone Encounter (Signed)
Pt mother has called because the pt is having break through bleedings with her periods and wants to know what the provider wants to do about that since this is on going issue

## 2018-01-14 NOTE — Telephone Encounter (Signed)
If she would like to change the method of birth control, this is an option.  We can consider depo, which does tend to reduce menses.

## 2018-01-16 ENCOUNTER — Ambulatory Visit (INDEPENDENT_AMBULATORY_CARE_PROVIDER_SITE_OTHER): Payer: Medicaid Other | Admitting: Endocrinology

## 2018-01-16 ENCOUNTER — Encounter: Payer: Self-pay | Admitting: Endocrinology

## 2018-01-16 VITALS — BP 118/74 | HR 100 | Ht 61.0 in | Wt 145.0 lb

## 2018-01-16 DIAGNOSIS — R7989 Other specified abnormal findings of blood chemistry: Secondary | ICD-10-CM

## 2018-01-16 NOTE — Patient Instructions (Addendum)
In view of your medical condition, you should avoid pregnancy until we have decided it is safe Please continue the same medication.   Please come back for a follow-up appointment in 6 months Please redo the blood tests sooner if you want--just let us know If the mid-month bleeding continues, please ask Dr Nadine Counts about it.

## 2018-01-16 NOTE — Progress Notes (Signed)
Subjective:    Patient ID: Samantha Tucker, female    DOB: [birth year], 22 y.o.   MRN: [id number]  HPI Pt returns for f/u of hypothyroidism (dx'ed early 2019; she was rx'ed low-dosage synthroid; she has never had thyroid imaging; she is not at risk for pregnancy).  pt states she feels well in general, except for mid-month bleeding.  OC has 35 mcg EE.   Past Medical History:  Diagnosis Date  . ADHD (attention deficit hyperactivity disorder)   . Allergy   . Anxiety   . Constipation   . Environmental allergies   . Nausea   . Postural hypotension     Past Surgical History:  Procedure Laterality Date  . TYMPANOSTOMY TUBE PLACEMENT Bilateral [id number]    Social History   Socioeconomic History  . Marital status: Single    Spouse name: Not on file  . Number of children: Not on file  . Years of education: Not on file  . Highest education level: Not on file  Occupational History  . Not on file  Social Needs  . Financial resource strain: Not on file  . Food insecurity:    Worry: Not on file    Inability: Not on file  . Transportation needs:    Medical: Not on file    Non-medical: Not on file  Tobacco Use  . Smoking status: Never Smoker  . Smokeless tobacco: Never Used  Substance and Sexual Activity  . Alcohol use: No    Comment: 12-27-15 per pt no   . Drug use: No    Comment: 12-27-15 per pt no   . Sexual activity: Never  Lifestyle  . Physical activity:    Days per week: Not on file    Minutes per session: Not on file  . Stress: Not on file  Relationships  . Social connections:    Talks on phone: Not on file    Gets together: Not on file    Attends religious service: Not on file    Active member of club or organization: Not on file    Attends meetings of clubs or organizations: Not on file    Relationship status: Not on file  . Intimate partner violence:    Fear of current or ex partner: Not on file    Emotionally abused: Not on file    Physically abused: Not on file    Forced sexual activity: Not on file  Other Topics Concern  . Not on file  Social History Narrative  . Not on file    Current Outpatient Medications on File Prior to Visit  Medication Sig Dispense Refill  . albuterol (PROVENTIL HFA;VENTOLIN HFA) 108 (90 Base) MCG/ACT inhaler Inhale 2 puffs into the lungs every 6 (six) hours as needed for wheezing or shortness of breath. 1 Inhaler 0  . amphetamine-dextroamphetamine (ADDERALL XR) 20 MG 24 hr capsule Take 1 capsule (20 mg total) by mouth daily. 30 capsule 0  . amphetamine-dextroamphetamine (ADDERALL XR) 20 MG 24 hr capsule Take 1 capsule (20 mg total) by mouth daily. 30 capsule 0  . amphetamine-dextroamphetamine (ADDERALL XR) 20 MG 24 hr capsule Take 1 capsule (20 mg total) by mouth daily. 30 capsule 0  . amphetamine-dextroamphetamine (ADDERALL) 10 MG tablet Take 1 tablet (10 mg total) by mouth daily. 30 tablet 0  . amphetamine-dextroamphetamine (ADDERALL) 10 MG tablet Take 1 tablet (10 mg total) by mouth daily. 30 tablet 0  . amphetamine-dextroamphetamine (ADDERALL) 10 MG tablet Take 1 tablet (10 mg  total) by mouth daily. 30 tablet 0  . ampicillin (PRINCIPEN) 500 MG capsule TK 1 C PO D  2  . buPROPion (WELLBUTRIN XL) 300 MG 24 hr tablet Take 1 tablet (300 mg total) by mouth every morning. 90 tablet 2  . clindamycin-benzoyl peroxide (BENZACLIN) gel APPLY ON THE SKIN DAILY TO FACE AT NIGHT  3  . desloratadine (CLARINEX) 5 MG tablet Take 5 mg by mouth daily.    . fluticasone (FLONASE) 50 MCG/ACT nasal spray Place 2 sprays into both nostrils daily. 16 g 4  . gabapentin (NEURONTIN) 300 MG capsule Take 1 capsule (300 mg total) by mouth 3 (three) times daily. 270 capsule 2  . levothyroxine (SYNTHROID, LEVOTHROID) 25 MCG tablet Take 1 tablet (25 mcg total) by mouth daily before breakfast. 90 tablet 3  . montelukast (SINGULAIR) 10 MG tablet Take 10 mg by mouth at bedtime.    . naproxen (NAPROSYN) 500 MG tablet Take 1 tablet (500 mg total) by mouth 2  (two) times daily with a meal. As needed for menstrual cramping. 30 tablet 0  . norgestimate-ethinyl estradiol (ORTHO-CYCLEN, 28,) 0.25-35 MG-MCG tablet Take 1 tablet by mouth daily. 1 Package 2  . norgestimate-ethinyl estradiol (ORTHO-CYCLEN, 28,) 0.25-35 MG-MCG tablet Take 1 tablet by mouth daily. 1 Package 12  . Olopatadine HCl (PATANASE NA) Place into the nose daily. Reported on 05/26/2015    . Olopatadine HCl 0.6 % SOLN U 2 SPRAYS IEN BID  1  . ondansetron (ZOFRAN) 4 MG tablet Take 1 tablet (4 mg total) by mouth every 8 (eight) hours as needed for nausea or vomiting. 30 tablet 1  . PARoxetine (PAXIL) 20 MG tablet Take 1 tablet (20 mg total) by mouth daily. 30 tablet 2  . spironolactone (ALDACTONE) 25 MG tablet TK 1 T PO BID  2  . traZODone (DESYREL) 100 MG tablet Take 1 tablet (100 mg total) by mouth at bedtime. 90 tablet 2   No current facility-administered medications on file prior to visit.     Allergies  Allergen Reactions  . Other Hives and Shortness Of Breath    Develops hives with exposure to cold air.   . Claritin [Loratadine]     Fainting spells  . Sulfa Antibiotics   . Latex Hives    Family History  Problem Relation Age of Onset  . Bipolar disorder Father   . Bipolar disorder Sister   . Bipolar disorder Brother   . Thyroid disease Brother   . Bipolar disorder Paternal Uncle   . Alcohol abuse Paternal Uncle   . Bipolar disorder Maternal Grandmother   . Bipolar disorder Paternal Grandfather   . Alcohol abuse Paternal Grandfather   . Bipolar disorder Paternal Grandmother   . Bipolar disorder Brother   . Bipolar disorder Brother     BP 118/74   Pulse 100   Ht 5\' 1"  (1.549 m)   Wt 145 lb (65.8 kg)   SpO2 97%   BMI 27.40 kg/m   Review of Systems Depression is less recently.     Objective:   Physical Exam VITAL SIGNS:  See vs page GENERAL: no distress NECK: There is no palpable thyroid enlargement.  No thyroid nodule is palpable.  No palpable  lymphadenopathy at the anterior neck.   Lab Results  Component Value Date   TSH 3.08 01/14/2018   T3TOTAL 157 08/22/2017   T4TOTAL 6.6 05/28/2013      Assessment & Plan:  Hypothyroidism: well-replaced Mid-month bleeding: I told pt this is  not thyroid-related.  OC pill seems to have enough EE.  Patient Instructions  In view of your medical condition, you should avoid pregnancy until we have decided it is safe Please continue the same medication.   Please come back for a follow-up appointment in 6 months Please redo the blood tests sooner if you want--just let us know If the mid-month bleeding continues, please ask Dr Nadine Counts about it.

## 2018-02-16 ENCOUNTER — Telehealth: Payer: Self-pay | Admitting: Family Medicine

## 2018-02-16 NOTE — Telephone Encounter (Signed)
appt scheduled for evaluation Pt notified  

## 2018-02-16 NOTE — Telephone Encounter (Signed)
PT is having nausea and dizziness with her period, on going issue with new symptoms, wants to speak to Dr Reece Agar nurse about what she should do.

## 2018-02-17 ENCOUNTER — Ambulatory Visit (INDEPENDENT_AMBULATORY_CARE_PROVIDER_SITE_OTHER): Payer: Medicaid Other | Admitting: Family Medicine

## 2018-02-17 ENCOUNTER — Encounter: Payer: Self-pay | Admitting: Family Medicine

## 2018-02-17 VITALS — BP 122/80 | HR 107 | Temp 98.5°F | Ht 61.0 in | Wt 144.0 lb

## 2018-02-17 DIAGNOSIS — E039 Hypothyroidism, unspecified: Secondary | ICD-10-CM | POA: Diagnosis not present

## 2018-02-17 DIAGNOSIS — R42 Dizziness and giddiness: Secondary | ICD-10-CM | POA: Diagnosis not present

## 2018-02-17 DIAGNOSIS — N946 Dysmenorrhea, unspecified: Secondary | ICD-10-CM | POA: Diagnosis not present

## 2018-02-17 DIAGNOSIS — Z793 Long term (current) use of hormonal contraceptives: Secondary | ICD-10-CM | POA: Diagnosis not present

## 2018-02-17 DIAGNOSIS — R4586 Emotional lability: Secondary | ICD-10-CM | POA: Diagnosis not present

## 2018-02-17 MED ORDER — ONDANSETRON HCL 4 MG PO TABS: 4.0000 mg | ORAL_TABLET | Freq: Three times a day (TID) | ORAL | 1 refills | Status: DC | PRN

## 2018-02-17 NOTE — Patient Instructions (Signed)
You had labs performed today.  You will be contacted with the results of the labs once they are available, usually in the next 3 business days for routine lab work.   Make sure that you discuss the mood changes with Dr Tenny Craw tomorrow.    Start a multivitamin with iron.  Hydrate.  Change positions slowly.   Vertigo Vertigo means that you feel like you are moving when you are not. Vertigo can also make you feel like things around you are moving when they are not. This feeling can come and go at any time. Vertigo often goes away on its own. Follow these instructions at home:  Avoid making fast movements.  Avoid driving.  Avoid using heavy machinery.  Avoid doing any task or activity that might cause danger to you or other people if you would have a vertigo attack while you are doing it.  Sit down right away if you feel dizzy or have trouble with your balance.  Take over-the-counter and prescription medicines only as told by your doctor.  Follow instructions from your doctor about which positions or movements you should avoid.  Drink enough fluid to keep your pee (urine) clear or pale yellow.  Keep all follow-up visits as told by your doctor. This is important. Contact a doctor if:  Medicine does not help your vertigo.  You have a fever.  Your problems get worse or you have new symptoms.  Your family or friends see changes in your behavior.  You feel sick to your stomach (nauseous) or you throw up (vomit).  You have a "pins and needles" feeling or you are numb in part of your body. Get help right away if:  You have trouble moving or talking.  You are always dizzy.  You pass out (faint).  You get very bad headaches.  You feel weak or have trouble using your hands, arms, or legs.  You have changes in your hearing.  You have changes in your seeing (vision).  You get a stiff neck.  Bright light starts to bother you. This information is not intended to replace advice  given to you by your health care provider. Make sure you discuss any questions you have with your health care provider. Document Released: 02/06/2008 Document Revised: 10/05/2015 Document Reviewed: 08/22/2014 Elsevier Interactive Patient Education  [employer].

## 2018-02-17 NOTE — Progress Notes (Signed)
Subjective: CC: Dysmenorrhea PCP: Samantha Ip, DO GEX:BMWU Samantha Tucker is a 22 y.o. female presenting to clinic today for:  1. Dysmenorrhea History: Several year history of cramping with periods.  Periods occur every 21-28 days and typically last about 5 days.  Medical history significant for POTS.  Family history significant for maternal history of fibroids and PCOS.  Negative family history for clotting disorders.  Patient has never been sexually active.   Patient last seen for issue in April.  Labs were within normal limits except for thyroid function.  There was no evidence of anemia or metabolic disorder at that time. She was using naproxen and Ortho Tri-Cyclen lo.  Because she was having persistent symptoms she was switched to monophasic Ortho-Cyclen.  There was decision to hold off on pelvic ultrasound.  October 20, 2017, there was a phone message reporting that the OCP was helping some and refills were provided.  On 01/14/2018 another phone call came through which noted that patient was having breakthrough bleeding with periods.  On 01/16/2018, patient saw endocrinology for hypothyroidism.  Her mid month bleeding was not thought to be related to thyroid dysfunction.  It was recommended that she follow-up here in office for reevaluation.  Patient is accompanied by her mother to today's visit and notes that menstrual cramping and heavy flow had improved.  However, last month she had 2 menstrual cycles.  She reports that she just started her menstrual cycle on Thursday and feels like her.  Is getting lighter now and coming to the end.  She notes that on Thursday, she had onset of dizziness, describing this as room spinning when she turns her head to the left, right, up or down.  She does not feel like this is her typical pots dizziness.  She feels like the irregular menstrual cycles started after starting treatment for hypothyroidism.  Her mother also describes "manic symptoms".  She states that she is  a fidgety and moody.  She reports this is very atypical for her behavior.  She has an appoint with her psychiatrist, Dr. Tenny Craw, tomorrow and is wondering if she should mention this.  ROS: Per HPI  Allergies  Allergen Reactions  . Other Hives and Shortness Of Breath    Develops hives with exposure to cold air.   . Claritin [Loratadine]     Fainting spells  . Sulfa Antibiotics   . Latex Hives   Past Medical History:  Diagnosis Date  . ADHD (attention deficit hyperactivity disorder)   . Allergy   . Anxiety   . Constipation   . Environmental allergies   . Nausea   . Postural hypotension     Current Outpatient Medications:  .  albuterol (PROVENTIL HFA;VENTOLIN HFA) 108 (90 Base) MCG/ACT inhaler, Inhale 2 puffs into the lungs every 6 (six) hours as needed for wheezing or shortness of breath., Disp: 1 Inhaler, Rfl: 0 .  amphetamine-dextroamphetamine (ADDERALL XR) 20 MG 24 hr capsule, Take 1 capsule (20 mg total) by mouth daily., Disp: 30 capsule, Rfl: 0 .  amphetamine-dextroamphetamine (ADDERALL XR) 20 MG 24 hr capsule, Take 1 capsule (20 mg total) by mouth daily., Disp: 30 capsule, Rfl: 0 .  amphetamine-dextroamphetamine (ADDERALL XR) 20 MG 24 hr capsule, Take 1 capsule (20 mg total) by mouth daily., Disp: 30 capsule, Rfl: 0 .  amphetamine-dextroamphetamine (ADDERALL) 10 MG tablet, Take 1 tablet (10 mg total) by mouth daily., Disp: 30 tablet, Rfl: 0 .  amphetamine-dextroamphetamine (ADDERALL) 10 MG tablet, Take 1 tablet (  10 mg total) by mouth daily., Disp: 30 tablet, Rfl: 0 .  amphetamine-dextroamphetamine (ADDERALL) 10 MG tablet, Take 1 tablet (10 mg total) by mouth daily., Disp: 30 tablet, Rfl: 0 .  ampicillin (PRINCIPEN) 500 MG capsule, TK 1 Samantha PO D, Disp: , Rfl: 2 .  buPROPion (WELLBUTRIN XL) 300 MG 24 hr tablet, Take 1 tablet (300 mg total) by mouth every morning., Disp: 90 tablet, Rfl: 2 .  clindamycin-benzoyl peroxide (BENZACLIN) gel, APPLY ON THE SKIN DAILY TO FACE AT NIGHT, Disp: ,  Rfl: 3 .  desloratadine (CLARINEX) 5 MG tablet, Take 5 mg by mouth daily., Disp: , Rfl:  .  fluticasone (FLONASE) 50 MCG/ACT nasal spray, Place 2 sprays into both nostrils daily., Disp: 16 g, Rfl: 4 .  gabapentin (NEURONTIN) 300 MG capsule, Take 1 capsule (300 mg total) by mouth 3 (three) times daily., Disp: 270 capsule, Rfl: 2 .  levothyroxine (SYNTHROID, LEVOTHROID) 25 MCG tablet, Take 1 tablet (25 mcg total) by mouth daily before breakfast., Disp: 90 tablet, Rfl: 3 .  montelukast (SINGULAIR) 10 MG tablet, Take 10 mg by mouth at bedtime., Disp: , Rfl:  .  naproxen (NAPROSYN) 500 MG tablet, Take 1 tablet (500 mg total) by mouth 2 (two) times daily with a meal. As needed for menstrual cramping., Disp: 30 tablet, Rfl: 0 .  norgestimate-ethinyl estradiol (ORTHO-CYCLEN, 28,) 0.25-35 MG-MCG tablet, Take 1 tablet by mouth daily., Disp: 1 Package, Rfl: 2 .  norgestimate-ethinyl estradiol (ORTHO-CYCLEN, 28,) 0.25-35 MG-MCG tablet, Take 1 tablet by mouth daily., Disp: 1 Package, Rfl: 12 .  Olopatadine HCl (PATANASE NA), Place into the nose daily. Reported on 05/26/2015, Disp: , Rfl:  .  Olopatadine HCl 0.6 % SOLN, U 2 SPRAYS IEN BID, Disp: , Rfl: 1 .  ondansetron (ZOFRAN) 4 MG tablet, Take 1 tablet (4 mg total) by mouth every 8 (eight) hours as needed for nausea or vomiting., Disp: 30 tablet, Rfl: 1 .  PARoxetine (PAXIL) 20 MG tablet, Take 1 tablet (20 mg total) by mouth daily., Disp: 30 tablet, Rfl: 2 .  spironolactone (ALDACTONE) 25 MG tablet, TK 1 T PO BID, Disp: , Rfl: 2 .  traZODone (DESYREL) 100 MG tablet, Take 1 tablet (100 mg total) by mouth at bedtime., Disp: 90 tablet, Rfl: 2 Social History   Socioeconomic History  . Marital status: Single    Spouse name: Not on file  . Number of children: Not on file  . Years of education: Not on file  . Highest education level: Not on file  Occupational History  . Not on file  Social Needs  . Financial resource strain: Not on file  . Food insecurity:     Worry: Not on file    Inability: Not on file  . Transportation needs:    Medical: Not on file    Non-medical: Not on file  Tobacco Use  . Smoking status: Never Smoker  . Smokeless tobacco: Never Used  Substance and Sexual Activity  . Alcohol use: No    Comment: 12-27-15 per pt no   . Drug use: No    Comment: 12-27-15 per pt no   . Sexual activity: Never  Lifestyle  . Physical activity:    Days per week: Not on file    Minutes per session: Not on file  . Stress: Not on file  Relationships  . Social connections:    Talks on phone: Not on file    Gets together: Not on file    Attends  religious service: Not on file    Active member of club or organization: Not on file    Attends meetings of clubs or organizations: Not on file    Relationship status: Not on file  . Intimate partner violence:    Fear of current or ex partner: Not on file    Emotionally abused: Not on file    Physically abused: Not on file    Forced sexual activity: Not on file  Other Topics Concern  . Not on file  Social History Narrative  . Not on file   Family History  Problem Relation Age of Onset  . Bipolar disorder Father   . Bipolar disorder Sister   . Bipolar disorder Brother   . Thyroid disease Brother   . Bipolar disorder Paternal Uncle   . Alcohol abuse Paternal Uncle   . Bipolar disorder Maternal Grandmother   . Bipolar disorder Paternal Grandfather   . Alcohol abuse Paternal Grandfather   . Bipolar disorder Paternal Grandmother   . Bipolar disorder Brother   . Bipolar disorder Brother     Objective: Office vital signs reviewed. BP 122/80   Pulse (!) 107   Temp 98.5 F (36.9 Samantha) (Oral)   Ht 5\' 1"  (1.549 m)   Wt 144 lb (65.3 kg)   BMI 27.21 kg/m   Physical Examination:  General: Awake, alert, well nourished, No acute distress HEENT: sclera white, MMM, no erythema; tympanic membranes intact bilaterally.  Scarring noted.  No erythema, bulging or purulence behind the membrane.  No  nasal discharge.  Mild conjunctival pallor noted.   Cardio: regular rate and rhythm, S1S2 heard, no murmurs appreciated Pulm: clear to auscultation bilaterally, no wheezes, rhonchi or rales; normal work of breathing on room air Psych: Mood stable, speech normal, affect appropriate, pleasant, interactive  Assessment/ Plan: 21 y.o. female   1. Dysmenorrhea treated with oral contraceptive Was doing quite well with the monophasic OCP.  However, I do question as to whether or not recent treatment for hypothyroidism may be disrupting menstrual cycle.  We discussed that this would likely level out as her hormones level out.  However, we did discuss possible referral to gynecology versus change to alternative OCP.  Mother agrees with plan and would like to wait until her thyroid is rechecked and other labs are obtained. - Basic Metabolic Panel - CBC - TSH  2. Acquired hypothyroidism Last TSH was within normal limits. - TSH  3. Vertigo Possibly related to excessive bleeding during menses versus underlying pots.  No evidence of sinus infection or inner ear issue during today's exam.  I did provide her handout for Epley maneuver for BPPV to perform at home if needed.  May consider referral to vestibular rehab if symptoms are not improving versus neurology. - Basic Metabolic Panel - TSH  4. Mood change Possibly related to thyroid dysfunction.  However, in the absence of abnormal lab results I would consider pursuing this with psychiatry.  I will cc her psychiatrist note today to see if this might be evaluated and addressed tomorrow.  Orders Placed This Encounter  Procedures  . Basic Metabolic Panel  . CBC  . TSH   Meds ordered this encounter  Medications  . ondansetron (ZOFRAN) 4 MG tablet    Sig: Take 1 tablet (4 mg total) by mouth every 8 (eight) hours as needed for nausea or vomiting.    Dispense:  30 tablet    Refill:  1   Samantha Hulen Skains, DO [place]  [facility] [phone]

## 2018-02-18 ENCOUNTER — Ambulatory Visit (HOSPITAL_COMMUNITY): Payer: Self-pay | Admitting: Psychiatry

## 2018-02-18 ENCOUNTER — Encounter (HOSPITAL_COMMUNITY): Payer: Self-pay | Admitting: Psychiatry

## 2018-02-18 ENCOUNTER — Ambulatory Visit (INDEPENDENT_AMBULATORY_CARE_PROVIDER_SITE_OTHER): Payer: Medicaid Other | Admitting: Psychiatry

## 2018-02-18 DIAGNOSIS — F419 Anxiety disorder, unspecified: Secondary | ICD-10-CM

## 2018-02-18 DIAGNOSIS — F331 Major depressive disorder, recurrent, moderate: Secondary | ICD-10-CM

## 2018-02-18 LAB — BASIC METABOLIC PANEL
BUN/Creatinine Ratio: 14 (ref 9–23)
BUN: 12 mg/dL (ref 6–20)
CO2: 26 mmol/L (ref 20–29)
Calcium: 9.7 mg/dL (ref 8.7–10.2)
Chloride: 97 mmol/L (ref 96–106)
Creatinine, Ser: 0.87 mg/dL (ref 0.57–1.00)
GFR calc Af Amer: 111 mL/min/{1.73_m2} (ref >59)
GFR calc non Af Amer: 96 mL/min/{1.73_m2} (ref >59)
Glucose: 78 mg/dL (ref 65–99)
Potassium: 4.3 mmol/L (ref 3.5–5.2)
Sodium: 138 mmol/L (ref 134–144)

## 2018-02-18 LAB — CBC
Hematocrit: 37 % (ref 34.0–46.6)
Hemoglobin: 12.5 g/dL (ref 11.1–15.9)
MCH: 29.2 pg (ref 26.6–33.0)
MCHC: 33.8 g/dL (ref 31.5–35.7)
MCV: 86 fL (ref 79–97)
Platelets: 262 10*3/uL (ref 150–450)
RBC: 4.28 x10E6/uL (ref 3.77–5.28)
RDW: 14.1 % (ref 12.3–15.4)
WBC: 4.4 10*3/uL (ref 3.4–10.8)

## 2018-02-18 LAB — TSH: TSH: 4.79 u[IU]/mL — ABNORMAL HIGH (ref 0.450–4.500)

## 2018-02-18 MED ORDER — CLONAZEPAM 0.5 MG PO TABS: 0.5000 mg | ORAL_TABLET | Freq: Two times a day (BID) | ORAL | 2 refills | Status: DC | PRN

## 2018-02-18 MED ORDER — AMPHETAMINE-DEXTROAMPHETAMINE 10 MG PO TABS: 10.0000 mg | ORAL_TABLET | Freq: Every day | ORAL | 0 refills | Status: DC

## 2018-02-18 MED ORDER — TRAZODONE HCL 100 MG PO TABS: 100.0000 mg | ORAL_TABLET | Freq: Every day | ORAL | 2 refills | Status: DC

## 2018-02-18 MED ORDER — PAROXETINE HCL 20 MG PO TABS: 20.0000 mg | ORAL_TABLET | Freq: Every day | ORAL | 2 refills | Status: DC

## 2018-02-18 MED ORDER — AMPHETAMINE-DEXTROAMPHET ER 20 MG PO CP24: 20.0000 mg | ORAL_CAPSULE | Freq: Every day | ORAL | 0 refills | Status: DC

## 2018-02-18 MED ORDER — BUPROPION HCL ER (XL) 300 MG PO TB24: 300.0000 mg | ORAL_TABLET | ORAL | 2 refills | Status: DC

## 2018-02-18 NOTE — Progress Notes (Signed)
BH MD/PA/NP OP Progress Note  02/18/2018 9:42 AM Samantha Tucker  MRN:  [id number]  Chief Complaint:  Chief Complaint    Depression; Anxiety; ADD; Follow-up     HPI: This patient is a 22 year old single white female who lives with her family in [place].  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  The patient returns after 6 weeks with her mother regarding depression anxiety and ADHD.  I had received a note yesterday from her primary physician Dr. Nadine Counts regarding her mood swings that are related to her menstrual cycle.  Today the patient states that she has been on birth control pill for the last 5 months.  She was having terrible cramps before she got on the pill and these have improved considerably.  However when she has her period she feels dizzy nauseous unable to function.  She is not able to go into work because of the dizziness.  Her mother reports that the patient is agitated angry and irritable.  She is not depressed or suicidal as much as irritable.  She has more difficulty sleeping and uses the trazodone during this time.  Her labs were checked yesterday and everything was normal except for slightly elevated TSH.  She is on Synthroid but this may need to go up a bit.  These factors were all discussed with mom and patient.  Her thyroid function is still not normalized which may have something to do with the mood swings.  It also sounds like she is having more anger and irritability just during the time of her.  Which may have something to do with the birth control pill.  I strongly suggested that they go back to endocrinology and/or see OB/GYN to try to get this regulated.  She is already on quite a bit of medication for her mood.  We can add a low-dose benzodiazepine during the week of her.  To help the irritability.  I do not think this is true "mania" as it resolves with the end of the menstrual cycle. Visit Diagnosis:    ICD-10-CM   1. Major  depressive disorder, recurrent episode, moderate (HCC) F33.1 buPROPion (WELLBUTRIN XL) 300 MG 24 hr tablet    Past Psychiatric History: Long-term outpatient treatment  Past Medical History:  Past Medical History:  Diagnosis Date  . ADHD (attention deficit hyperactivity disorder)   . Allergy   . Anxiety   . Constipation   . Environmental allergies   . Nausea   . Postural hypotension     Past Surgical History:  Procedure Laterality Date  . TYMPANOSTOMY TUBE PLACEMENT Bilateral [id number]    Family Psychiatric History: See below  Family History:  Family History  Problem Relation Age of Onset  . Bipolar disorder Father   . Bipolar disorder Sister   . Bipolar disorder Brother   . Thyroid disease Brother   . Bipolar disorder Paternal Uncle   . Alcohol abuse Paternal Uncle   . Bipolar disorder Maternal Grandmother   . Bipolar disorder Paternal Grandfather   . Alcohol abuse Paternal Grandfather   . Bipolar disorder Paternal Grandmother   . Bipolar disorder Brother   . Bipolar disorder Brother     Social History:  Social History   Socioeconomic History  . Marital status: Single    Spouse name: Not on file  . Number of children: Not on file  . Years of education: Not on file  . Highest education level: Not on file  Occupational History  . Not on file  Social Needs  . Financial resource strain: Not on file  . Food insecurity:    Worry: Not on file    Inability: Not on file  . Transportation needs:    Medical: Not on file    Non-medical: Not on file  Tobacco Use  . Smoking status: Never Smoker  . Smokeless tobacco: Never Used  Substance and Sexual Activity  . Alcohol use: No    Comment: 12-27-15 per pt no   . Drug use: No    Comment: 12-27-15 per pt no   . Sexual activity: Never  Lifestyle  . Physical activity:    Days per week: Not on file    Minutes per session: Not on file  . Stress: Not on file  Relationships  . Social connections:    Talks on phone: Not  on file    Gets together: Not on file    Attends religious service: Not on file    Active member of club or organization: Not on file    Attends meetings of clubs or organizations: Not on file    Relationship status: Not on file  Other Topics Concern  . Not on file  Social History Narrative  . Not on file    Allergies:  Allergies  Allergen Reactions  . Other Hives and Shortness Of Breath    Develops hives with exposure to cold air.   . Claritin [Loratadine]     Fainting spells  . Sulfa Antibiotics   . Latex Hives    Metabolic Disorder Labs: No results found for: HGBA1C, MPG No results found for: PROLACTIN No results found for: CHOL, TRIG, HDL, CHOLHDL, VLDL, LDLCALC Lab Results  Component Value Date   TSH 4.790 (H) 02/17/2018   TSH 3.08 01/14/2018    Therapeutic Level Labs: No results found for: LITHIUM No results found for: VALPROATE No components found for:  CBMZ  Current Medications: Current Outpatient Medications  Medication Sig Dispense Refill  . albuterol (PROVENTIL HFA;VENTOLIN HFA) 108 (90 Base) MCG/ACT inhaler Inhale 2 puffs into the lungs every 6 (six) hours as needed for wheezing or shortness of breath. 1 Inhaler 0  . amphetamine-dextroamphetamine (ADDERALL XR) 20 MG 24 hr capsule Take 1 capsule (20 mg total) by mouth daily. 30 capsule 0  . amphetamine-dextroamphetamine (ADDERALL XR) 20 MG 24 hr capsule Take 1 capsule (20 mg total) by mouth daily. 30 capsule 0  . amphetamine-dextroamphetamine (ADDERALL XR) 20 MG 24 hr capsule Take 1 capsule (20 mg total) by mouth daily. 30 capsule 0  . amphetamine-dextroamphetamine (ADDERALL) 10 MG tablet Take 1 tablet (10 mg total) by mouth daily. 30 tablet 0  . amphetamine-dextroamphetamine (ADDERALL) 10 MG tablet Take 1 tablet (10 mg total) by mouth daily. 30 tablet 0  . amphetamine-dextroamphetamine (ADDERALL) 10 MG tablet Take 1 tablet (10 mg total) by mouth daily. 30 tablet 0  . ampicillin (PRINCIPEN) 500 MG capsule TK  1 C PO D  2  . buPROPion (WELLBUTRIN XL) 300 MG 24 hr tablet Take 1 tablet (300 mg total) by mouth every morning. 90 tablet 2  . clindamycin-benzoyl peroxide (BENZACLIN) gel APPLY ON THE SKIN DAILY TO FACE AT NIGHT  3  . desloratadine (CLARINEX) 5 MG tablet Take 5 mg by mouth daily.    . fluticasone (FLONASE) 50 MCG/ACT nasal spray Place 2 sprays into both nostrils daily. 16 g 4  . gabapentin (NEURONTIN) 300 MG capsule Take 1 capsule (300 mg total)  by mouth 3 (three) times daily. 270 capsule 2  . levothyroxine (SYNTHROID, LEVOTHROID) 25 MCG tablet Take 1 tablet (25 mcg total) by mouth daily before breakfast. 90 tablet 3  . montelukast (SINGULAIR) 10 MG tablet Take 10 mg by mouth at bedtime.    . naproxen (NAPROSYN) 500 MG tablet Take 1 tablet (500 mg total) by mouth 2 (two) times daily with a meal. As needed for menstrual cramping. 30 tablet 0  . norgestimate-ethinyl estradiol (ORTHO-CYCLEN, 28,) 0.25-35 MG-MCG tablet Take 1 tablet by mouth daily. 1 Package 12  . Olopatadine HCl (PATANASE NA) Place into the nose daily. Reported on 05/26/2015    . Olopatadine HCl 0.6 % SOLN U 2 SPRAYS IEN BID  1  . ondansetron (ZOFRAN) 4 MG tablet Take 1 tablet (4 mg total) by mouth every 8 (eight) hours as needed for nausea or vomiting. 30 tablet 1  . PARoxetine (PAXIL) 20 MG tablet Take 1 tablet (20 mg total) by mouth daily. 30 tablet 2  . spironolactone (ALDACTONE) 25 MG tablet TK 1 T PO BID  2  . traZODone (DESYREL) 100 MG tablet Take 1 tablet (100 mg total) by mouth at bedtime. 90 tablet 2  . clonazePAM (KLONOPIN) 0.5 MG tablet Take 1 tablet (0.5 mg total) by mouth 2 (two) times daily as needed for anxiety. 60 tablet 2   No current facility-administered medications for this visit.      Musculoskeletal: Strength & Muscle Tone: within normal limits Gait & Station: normal Patient leans: N/A  Psychiatric Specialty Exam: Review of Systems  Gastrointestinal: Positive for nausea.  Neurological: Positive for  dizziness and headaches.  Psychiatric/Behavioral: The patient is nervous/anxious.   All other systems reviewed and are negative.   Blood pressure 104/70, pulse 86, height 5\' 1"  (1.549 m), weight 145 lb (65.8 kg), SpO2 99 %.Body mass index is 27.4 kg/m.  General Appearance: Casual and Fairly Groomed  Eye Contact:  Fair  Speech:  Clear and Coherent  Volume:  Normal  Mood:  Anxious  Affect:  Congruent  Thought Process:  Goal Directed  Orientation:  Full (Time, Place, and Person)  Thought Content: Rumination   Suicidal Thoughts:  No  Homicidal Thoughts:  No  Memory:  Immediate;   Good Recent;   Good Remote;   Fair  Judgement:  Fair  Insight:  Fair  Psychomotor Activity:  Normal  Concentration:  Concentration: Good and Attention Span: Good  Recall:  Good  Fund of Knowledge: Good  Language: Good  Akathisia:  No  Handed:  Right  AIMS (if indicated): not done  Assets:  Communication Skills Desire for Improvement Physical Health Resilience Social Support Talents/Skills  ADL's:  Intact  Cognition: WNL  Sleep:  Good   Screenings: PHQ2-9     Office Visit from 02/17/2018 in [facility] Office Visit from 08/22/2017 in [facility] Office Visit from 08/27/2016 in [facility] Clinical Support from 11/01/2015 in [facility] Office Visit from 05/28/2013 in [facility]  PHQ-2 Total Score  1  2  0  0  0  PHQ-9 Total Score  -  9  -  -  -       Assessment and Plan: This patient is a 22 year old female with a history of depression anxiety mood swings and now increased anger and irritability during her menstrual cycle.  Still feels like there is something that is not quite right with her endocrine system and this  needs to be further evaluated.  Her TSH is still slightly elevated.  I will communicate with her primary doctor regarding this.  She will continue Adderall XR 20 mg in the  morning and 10 mg of regular Adderall in the afternoon for focus, Wellbutrin XL 300 mg as well as Paxil 20 mg daily for depression and gabapentin 300 mg 3 times daily for anxiety.  Clonazepam 0.5 mg twice daily will be added as needed during the week of her period.  She will return to see me in 6 weeks   Diannia Ruder, MD 02/18/2018, 9:42 AM

## 2018-04-01 ENCOUNTER — Ambulatory Visit (INDEPENDENT_AMBULATORY_CARE_PROVIDER_SITE_OTHER): Payer: Medicaid Other | Admitting: Psychiatry

## 2018-04-01 ENCOUNTER — Encounter (HOSPITAL_COMMUNITY): Payer: Self-pay | Admitting: Psychiatry

## 2018-04-01 VITALS — BP 125/82 | HR 113 | Ht 61.0 in | Wt 142.0 lb

## 2018-04-01 DIAGNOSIS — F988 Other specified behavioral and emotional disorders with onset usually occurring in childhood and adolescence: Secondary | ICD-10-CM

## 2018-04-01 DIAGNOSIS — L700 Acne vulgaris: Secondary | ICD-10-CM | POA: Diagnosis not present

## 2018-04-01 DIAGNOSIS — F331 Major depressive disorder, recurrent, moderate: Secondary | ICD-10-CM | POA: Diagnosis not present

## 2018-04-01 MED ORDER — BUPROPION HCL ER (XL) 300 MG PO TB24: 300.0000 mg | ORAL_TABLET | ORAL | 2 refills | Status: DC

## 2018-04-01 MED ORDER — GABAPENTIN 300 MG PO CAPS: 300.0000 mg | ORAL_CAPSULE | Freq: Three times a day (TID) | ORAL | 2 refills | Status: DC

## 2018-04-01 MED ORDER — CLONAZEPAM 0.5 MG PO TABS: 0.5000 mg | ORAL_TABLET | Freq: Two times a day (BID) | ORAL | 2 refills | Status: DC | PRN

## 2018-04-01 MED ORDER — PAROXETINE HCL 20 MG PO TABS: 20.0000 mg | ORAL_TABLET | Freq: Every day | ORAL | 2 refills | Status: DC

## 2018-04-01 MED ORDER — TRAZODONE HCL 100 MG PO TABS: 100.0000 mg | ORAL_TABLET | Freq: Every day | ORAL | 2 refills | Status: DC

## 2018-04-01 NOTE — Progress Notes (Signed)
[facility] MD/PA/NP OP Progress Note  04/01/2018 10:10 AM Samantha Tucker  MRN:  [id number]  Chief Complaint:  Chief Complaint    Depression; Anxiety; Follow-up; ADD     HPI: This patient is a 22 year old single white female who lives with her family in [place].  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  The patient returns after 4 weeks with her mother regarding depression anxiety and ADHD.  She states that she is feeling better.  She is taking naproxen during her period and is having much less pain and cramping.  She still gets breakthrough bleeding and I encouraged her to talk to her gynecologist about this.  She is no longer having the dizziness and nausea.  The clonazepam helps with the irritability.  She is sleeping well with the trazodone and her mood has improved.  Her TSH is still slightly elevated but her primary care physician is having her take the medication on an empty stomach and then rechecking in a few weeks. Visit Diagnosis:    ICD-10-CM   1. 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 Major depressive disorder, recurrent episode, moderate (HCC) F33.1 buPROPion (WELLBUTRIN XL) 300 MG 24 hr tablet    DISCONTINUED: buPROPion (WELLBUTRIN XL) 300 MG 24 hr tablet    Past Psychiatric History: Long-term outpatient treatment  Past Medical History:  Past Medical History:  Diagnosis Date  . ADHD (attention deficit hyperactivity disorder)   . Allergy   . Anxiety   . Constipation   . Environmental allergies   . Nausea   . Postural hypotension     Past Surgical History:  Procedure Laterality Date  . TYMPANOSTOMY TUBE PLACEMENT Bilateral [id number]    Family Psychiatric History: See below  Family History:  Family History  Problem Relation Age of Onset  . Bipolar disorder Father   . Bipolar disorder Sister   . Bipolar disorder Brother   . Thyroid disease Brother   . Bipolar disorder Paternal Uncle   . Alcohol abuse  Paternal Uncle   . Bipolar disorder Maternal Grandmother   . Bipolar disorder Paternal Grandfather   . Alcohol abuse Paternal Grandfather   . Bipolar disorder Paternal Grandmother   . Bipolar disorder Brother   . Bipolar disorder Brother     Social History:  Social History   Socioeconomic History  . Marital status: Single    Spouse name: Not on file  . Number of children: Not on file  . Years of education: Not on file  . Highest education level: Not on file  Occupational History  . Not on file  Social Needs  . Financial resource strain: Not on file  . Food insecurity:    Worry: Not on file    Inability: Not on file  . Transportation needs:    Medical: Not on file    Non-medical: Not on file  Tobacco Use  . Smoking status: Never Smoker  . Smokeless tobacco: Never Used  Substance and Sexual Activity  . Alcohol use: No    Comment: 12-27-15 per pt no   . Drug use: No    Comment: 12-27-15 per pt no   . Sexual activity: Never  Lifestyle  . Physical activity:    Days per week: Not on file    Minutes per session: Not on file  . Stress: Not on file  Relationships  . Social connections:    Talks on phone: Not on file  Gets together: Not on file    Attends religious service: Not on file    Active member of club or organization: Not on file    Attends meetings of clubs or organizations: Not on file    Relationship status: Not on file  Other Topics Concern  . Not on file  Social History Narrative  . Not on file    Allergies:  Allergies  Allergen Reactions  . Other Hives and Shortness Of Breath    Develops hives with exposure to cold air.   . Claritin [Loratadine]     Fainting spells  . Sulfa Antibiotics   . Latex Hives    Metabolic Disorder Labs: No results found for: HGBA1C, MPG No results found for: PROLACTIN No results found for: CHOL, TRIG, HDL, CHOLHDL, VLDL, LDLCALC Lab Results  Component Value Date   TSH 4.790 (H) 02/17/2018   TSH 3.08 01/14/2018     Therapeutic Level Labs: No results found for: LITHIUM No results found for: VALPROATE No components found for:  CBMZ  Current Medications: Current Outpatient Medications  Medication Sig Dispense Refill  . albuterol (PROVENTIL HFA;VENTOLIN HFA) 108 (90 Base) MCG/ACT inhaler Inhale 2 puffs into the lungs every 6 (six) hours as needed for wheezing or shortness of breath. 1 Inhaler 0  . amphetamine-dextroamphetamine (ADDERALL XR) 20 MG 24 hr capsule Take 1 capsule (20 mg total) by mouth daily. 30 capsule 0  . amphetamine-dextroamphetamine (ADDERALL XR) 20 MG 24 hr capsule Take 1 capsule (20 mg total) by mouth daily. 30 capsule 0  . amphetamine-dextroamphetamine (ADDERALL XR) 20 MG 24 hr capsule Take 1 capsule (20 mg total) by mouth daily. 30 capsule 0  . amphetamine-dextroamphetamine (ADDERALL) 10 MG tablet Take 1 tablet (10 mg total) by mouth daily. 30 tablet 0  . amphetamine-dextroamphetamine (ADDERALL) 10 MG tablet Take 1 tablet (10 mg total) by mouth daily. 30 tablet 0  . amphetamine-dextroamphetamine (ADDERALL) 10 MG tablet Take 1 tablet (10 mg total) by mouth daily. 30 tablet 0  . ampicillin (PRINCIPEN) 500 MG capsule TK 1 C PO D  2  . buPROPion (WELLBUTRIN XL) 300 MG 24 hr tablet Take 1 tablet (300 mg total) by mouth every morning. 90 tablet 2  . clindamycin-benzoyl peroxide (BENZACLIN) gel APPLY ON THE SKIN DAILY TO FACE AT NIGHT  3  . clonazePAM (KLONOPIN) 0.5 MG tablet Take 1 tablet (0.5 mg total) by mouth 2 (two) times daily as needed for anxiety. 60 tablet 2  . desloratadine (CLARINEX) 5 MG tablet Take 5 mg by mouth daily.    . fluticasone (FLONASE) 50 MCG/ACT nasal spray Place 2 sprays into both nostrils daily. 16 g 4  . gabapentin (NEURONTIN) 300 MG capsule Take 1 capsule (300 mg total) by mouth 3 (three) times daily. 270 capsule 2  . levothyroxine (SYNTHROID, LEVOTHROID) 25 MCG tablet Take 1 tablet (25 mcg total) by mouth daily before breakfast. 90 tablet 3  . montelukast  (SINGULAIR) 10 MG tablet Take 10 mg by mouth at bedtime.    . naproxen (NAPROSYN) 500 MG tablet Take 1 tablet (500 mg total) by mouth 2 (two) times daily with a meal. As needed for menstrual cramping. 30 tablet 0  . norgestimate-ethinyl estradiol (ORTHO-CYCLEN, 28,) 0.25-35 MG-MCG tablet Take 1 tablet by mouth daily. 1 Package 12  . Olopatadine HCl (PATANASE NA) Place into the nose daily. Reported on 05/26/2015    . Olopatadine HCl 0.6 % SOLN U 2 SPRAYS IEN BID  1  . ondansetron (ZOFRAN)  4 MG tablet Take 1 tablet (4 mg total) by mouth every 8 (eight) hours as needed for nausea or vomiting. 30 tablet 1  . PARoxetine (PAXIL) 20 MG tablet Take 1 tablet (20 mg total) by mouth daily. 90 tablet 2  . spironolactone (ALDACTONE) 25 MG tablet TK 1 T PO BID  2  . traZODone (DESYREL) 100 MG tablet Take 1 tablet (100 mg total) by mouth at bedtime. 90 tablet 2   No current facility-administered medications for this visit.      Musculoskeletal: Strength & Muscle Tone: within normal limits Gait & Station: normal Patient leans: no  Psychiatric Specialty Exam: Review of Systems  All other systems reviewed and are negative.   Blood pressure 125/82, pulse (!) 113, height 5\' 1"  (1.549 m), weight 142 lb (64.4 kg), SpO2 100 %.Body mass index is 26.83 kg/m.  General Appearance: Casual and Fairly Groomed  Eye Contact:  Good  Speech:  Clear and Coherent  Volume:  Normal  Mood:  Euthymic  Affect:  Congruent  Thought Process:  Goal Directed  Orientation:  Full (Time, Place, and Person)  Thought Content: WDL   Suicidal Thoughts:  No  Homicidal Thoughts:  No  Memory:  Immediate;   Good Recent;   Good Remote;   Fair  Judgement:  Fair  Insight:  Fair  Psychomotor Activity:  Normal  Concentration:  Concentration: Good and Attention Span: Good  Recall:  Good  Fund of Knowledge: Good  Language: Good  Akathisia:  No  Handed:  Right  AIMS (if indicated): not done  Assets:  Communication Skills Desire for  Improvement Physical Health Resilience Social Support Talents/Skills  ADL's:  Intact  Cognition: WNL  Sleep:  Good   Screenings: PHQ2-9     Office Visit from 02/17/2018 in [facility] Office Visit from 08/22/2017 in [facility] Office Visit from 08/27/2016 in [facility] Clinical Support from 11/01/2015 in [facility] Office Visit from 05/28/2013 in [facility]  PHQ-2 Total Score  1  2  0  0  0  PHQ-9 Total Score  -  9  -  -  -       Assessment and Plan: This patient is a 22 year old female with a history of depression anxiety and ADD.  She is doing better on her current regimen.  She will continue trazodone 100 mg at bedtime for sleep, Paxil 20 mg daily along with Wellbutrin XL 300 mg daily for depression, gabapentin 300 mg 3 times daily for anxiety, clonazepam 0.5 mg 2 times daily as needed for breakthrough anxiety, Adderall XR 20 mg every morning for focus and Adderall 10 mg later in the day as needed for focus.  She will return to see me in 2 months   Diannia Ruder, MD 04/01/2018, 10:10 AM

## 2018-05-20 ENCOUNTER — Telehealth: Payer: Self-pay | Admitting: Family Medicine

## 2018-05-20 ENCOUNTER — Other Ambulatory Visit: Payer: Self-pay | Admitting: Family Medicine

## 2018-05-20 MED ORDER — FLUCONAZOLE 150 MG PO TABS: 150.0000 mg | ORAL_TABLET | Freq: Once | ORAL | 0 refills | Status: AC

## 2018-05-20 NOTE — Telephone Encounter (Signed)
Patient aware, script is ready. 

## 2018-05-20 NOTE — Telephone Encounter (Signed)
Sure.  I will send in Diflucan.  If symptoms persist, have her pop in and we can do a swab.

## 2018-05-22 ENCOUNTER — Other Ambulatory Visit: Payer: Self-pay | Admitting: Family Medicine

## 2018-05-22 MED ORDER — NAPROXEN 500 MG PO TABS: 500.0000 mg | ORAL_TABLET | Freq: Two times a day (BID) | ORAL | 0 refills | Status: DC

## 2018-05-22 NOTE — Telephone Encounter (Signed)
Pharmacy: [facility] on [address]  PT is needing naproxen (NAPROSYN) 500 MG tablet sent to pharmacy

## 2018-06-01 ENCOUNTER — Ambulatory Visit (INDEPENDENT_AMBULATORY_CARE_PROVIDER_SITE_OTHER): Payer: Medicaid Other | Admitting: Psychiatry

## 2018-06-01 ENCOUNTER — Encounter (HOSPITAL_COMMUNITY): Payer: Self-pay | Admitting: Psychiatry

## 2018-06-01 VITALS — BP 110/73 | HR 78 | Ht 61.0 in | Wt 141.0 lb

## 2018-06-01 DIAGNOSIS — F331 Major depressive disorder, recurrent, moderate: Secondary | ICD-10-CM

## 2018-06-01 DIAGNOSIS — F988 Other specified behavioral and emotional disorders with onset usually occurring in childhood and adolescence: Secondary | ICD-10-CM

## 2018-06-01 MED ORDER — GABAPENTIN 300 MG PO CAPS: 300.0000 mg | ORAL_CAPSULE | Freq: Three times a day (TID) | ORAL | 2 refills | Status: DC

## 2018-06-01 MED ORDER — AMPHETAMINE-DEXTROAMPHET ER 20 MG PO CP24: 20.0000 mg | ORAL_CAPSULE | Freq: Every day | ORAL | 0 refills | Status: DC

## 2018-06-01 MED ORDER — CLONAZEPAM 0.5 MG PO TABS: 0.5000 mg | ORAL_TABLET | Freq: Two times a day (BID) | ORAL | 2 refills | Status: DC | PRN

## 2018-06-01 MED ORDER — TRAZODONE HCL 100 MG PO TABS: 100.0000 mg | ORAL_TABLET | Freq: Every day | ORAL | 2 refills | Status: DC

## 2018-06-01 MED ORDER — AMPHETAMINE-DEXTROAMPHETAMINE 10 MG PO TABS: 10.0000 mg | ORAL_TABLET | Freq: Every day | ORAL | 0 refills | Status: DC

## 2018-06-01 MED ORDER — PAROXETINE HCL 20 MG PO TABS: 30.0000 mg | ORAL_TABLET | Freq: Every day | ORAL | 2 refills | Status: DC

## 2018-06-01 MED ORDER — BUPROPION HCL ER (XL) 300 MG PO TB24: 300.0000 mg | ORAL_TABLET | ORAL | 2 refills | Status: DC

## 2018-06-01 NOTE — Progress Notes (Signed)
[place] MD/PA/NP OP Progress Note  06/01/2018 9:33 AM Samantha Tucker  MRN:  [id number]  Chief Complaint:  Chief Complaint    Depression; Anxiety; ADD     HPI: This patient is a 23 year old single white female who lives with her family in [place].  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  The patient returns with her mother after 2 months regarding depression anxiety and ADHD.  She seems to be a little bit more anxious.  She states that she wakes up in the morning with anxiety and does not feel ready to face the day.  When she gets going she feels somewhat better.  I suggested that we increase her Paxil at nighttime and maybe she will wake up in a better mood.  She has few outside activities other than work.  She does not want to be around people in real life but has a lot of online friends.  Most of her online friends live in other countries and in fact she had a serious boyfriend from [place] for a while but they never met in person and eventually she broke this off.  She seems reluctant to get involved with people face-to-face.  Her mother is concerned about this but I reminded her she cannot push it and the patient will have to decide this for herself.  She denies suicidal ideation and she is sleeping well most of the time.  Her birth control pills seem to be managing her heavy cycles.  Her energy is slightly better.  She has not yet repeated her thyroid testing Visit Diagnosis:    ICD-10-CM   1. 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 Major depressive disorder, recurrent episode, moderate (HCC) F33.1 buPROPion (WELLBUTRIN XL) 300 MG 24 hr tablet    Past Psychiatric History: Long-term outpatient treatment  Past Medical History:  Past Medical History:  Diagnosis Date  . ADHD (attention deficit hyperactivity disorder)   . Allergy   . Anxiety   . Constipation   . Environmental allergies   . Nausea   . Postural hypotension      Past Surgical History:  Procedure Laterality Date  . TYMPANOSTOMY TUBE PLACEMENT Bilateral [id number]    Family Psychiatric History: See below  Family History:  Family History  Problem Relation Age of Onset  . Bipolar disorder Father   . Bipolar disorder Sister   . Bipolar disorder Brother   . Thyroid disease Brother   . Bipolar disorder Paternal Uncle   . Alcohol abuse Paternal Uncle   . Bipolar disorder Maternal Grandmother   . Bipolar disorder Paternal Grandfather   . Alcohol abuse Paternal Grandfather   . Bipolar disorder Paternal Grandmother   . Bipolar disorder Brother   . Bipolar disorder Brother     Social History:  Social History   Socioeconomic History  . Marital status: Single    Spouse name: Not on file  . Number of children: Not on file  . Years of education: Not on file  . Highest education level: Not on file  Occupational History  . Not on file  Social Needs  . Financial resource strain: Not on file  . Food insecurity:    Worry: Not on file    Inability: Not on file  . Transportation needs:    Medical: Not on file    Non-medical: Not on file  Tobacco Use  . Smoking status: Never Smoker  . Smokeless tobacco: Never Used  Substance and Sexual Activity  . Alcohol use: No    Comment: 12-27-15 per pt no   . Drug use: No    Comment: 12-27-15 per pt no   . Sexual activity: Never  Lifestyle  . Physical activity:    Days per week: Not on file    Minutes per session: Not on file  . Stress: Not on file  Relationships  . Social connections:    Talks on phone: Not on file    Gets together: Not on file    Attends religious service: Not on file    Active member of club or organization: Not on file    Attends meetings of clubs or organizations: Not on file    Relationship status: Not on file  Other Topics Concern  . Not on file  Social History Narrative  . Not on file    Allergies:  Allergies  Allergen Reactions  . Other Hives and Shortness Of  Breath    Develops hives with exposure to cold air.   . Claritin [Loratadine]     Fainting spells  . Sulfa Antibiotics   . Latex Hives    Metabolic Disorder Labs: No results found for: HGBA1C, MPG No results found for: PROLACTIN No results found for: CHOL, TRIG, HDL, CHOLHDL, VLDL, LDLCALC Lab Results  Component Value Date   TSH 4.790 (H) 02/17/2018   TSH 3.08 01/14/2018    Therapeutic Level Labs: No results found for: LITHIUM No results found for: VALPROATE No components found for:  CBMZ  Current Medications: Current Outpatient Medications  Medication Sig Dispense Refill  . albuterol (PROVENTIL HFA;VENTOLIN HFA) 108 (90 Base) MCG/ACT inhaler Inhale 2 puffs into the lungs every 6 (six) hours as needed for wheezing or shortness of breath. 1 Inhaler 0  . amphetamine-dextroamphetamine (ADDERALL XR) 20 MG 24 hr capsule Take 1 capsule (20 mg total) by mouth daily. 30 capsule 0  . amphetamine-dextroamphetamine (ADDERALL XR) 20 MG 24 hr capsule Take 1 capsule (20 mg total) by mouth daily. 30 capsule 0  . amphetamine-dextroamphetamine (ADDERALL XR) 20 MG 24 hr capsule Take 1 capsule (20 mg total) by mouth daily. 30 capsule 0  . amphetamine-dextroamphetamine (ADDERALL) 10 MG tablet Take 1 tablet (10 mg total) by mouth daily. 30 tablet 0  . amphetamine-dextroamphetamine (ADDERALL) 10 MG tablet Take 1 tablet (10 mg total) by mouth daily. 30 tablet 0  . amphetamine-dextroamphetamine (ADDERALL) 10 MG tablet Take 1 tablet (10 mg total) by mouth daily. 30 tablet 0  . ampicillin (PRINCIPEN) 500 MG capsule TK 1 C PO D  2  . buPROPion (WELLBUTRIN XL) 300 MG 24 hr tablet Take 1 tablet (300 mg total) by mouth every morning. 90 tablet 2  . clindamycin-benzoyl peroxide (BENZACLIN) gel APPLY ON THE SKIN DAILY TO FACE AT NIGHT  3  . clonazePAM (KLONOPIN) 0.5 MG tablet Take 1 tablet (0.5 mg total) by mouth 2 (two) times daily as needed for anxiety. 60 tablet 2  . desloratadine (CLARINEX) 5 MG tablet Take  5 mg by mouth daily.    . fluticasone (FLONASE) 50 MCG/ACT nasal spray Place 2 sprays into both nostrils daily. 16 g 4  . gabapentin (NEURONTIN) 300 MG capsule Take 1 capsule (300 mg total) by mouth 3 (three) times daily. 270 capsule 2  . levothyroxine (SYNTHROID, LEVOTHROID) 25 MCG tablet Take 1 tablet (25 mcg total) by mouth daily before breakfast. 90 tablet 3  . montelukast (SINGULAIR) 10 MG tablet Take 10 mg by mouth at  bedtime.    . naproxen (NAPROSYN) 500 MG tablet Take 1 tablet (500 mg total) by mouth 2 (two) times daily with a meal. As needed for menstrual cramping. 30 tablet 0  . norgestimate-ethinyl estradiol (ORTHO-CYCLEN, 28,) 0.25-35 MG-MCG tablet Take 1 tablet by mouth daily. 1 Package 12  . Olopatadine HCl (PATANASE NA) Place into the nose daily. Reported on 05/26/2015    . Olopatadine HCl 0.6 % SOLN U 2 SPRAYS IEN BID  1  . ondansetron (ZOFRAN) 4 MG tablet Take 1 tablet (4 mg total) by mouth every 8 (eight) hours as needed for nausea or vomiting. 30 tablet 1  . PARoxetine (PAXIL) 20 MG tablet Take 1.5 tablets (30 mg total) by mouth daily. 135 tablet 2  . spironolactone (ALDACTONE) 25 MG tablet TK 1 T PO BID  2  . traZODone (DESYREL) 100 MG tablet Take 1 tablet (100 mg total) by mouth at bedtime. 90 tablet 2   No current facility-administered medications for this visit.      Musculoskeletal: Strength & Muscle Tone: within normal limits Gait & Station: normal Patient leans: N/A  Psychiatric Specialty Exam: Review of Systems  Psychiatric/Behavioral: The patient is nervous/anxious.   All other systems reviewed and are negative.   Blood pressure 110/73, pulse 78, height '5\' 1"'$  (1.549 m), weight 141 lb (64 kg), SpO2 99 %.Body mass index is 26.64 kg/m.  General Appearance: Casual and Fairly Groomed  Eye Contact:  Fair  Speech:  Clear and Coherent  Volume:  Normal  Mood:  Anxious  Affect:  Congruent  Thought Process:  Goal Directed  Orientation:  Full (Time, Place, and  Person)  Thought Content: Rumination   Suicidal Thoughts:  No  Homicidal Thoughts:  No  Memory:  Immediate;   Good Recent;   Good Remote;   Fair  Judgement:  Fair  Insight:  Fair  Psychomotor Activity:  Normal  Concentration:  Concentration: Good and Attention Span: Good  Recall:  Good  Fund of Knowledge: Good  Language: Good  Akathisia:  No  Handed:  Right  AIMS (if indicated): not done  Assets:  Communication Skills Desire for Improvement Physical Health Resilience Social Support Talents/Skills  ADL's:  Intact  Cognition: WNL  Sleep:  Good   Screenings: PHQ2-9     Office Visit from 02/17/2018 in [facility] Office Visit from 08/22/2017 in [facility] Office Visit from 08/27/2016 in [facility] from 11/01/2015 in [facility] Office Visit from 05/28/2013 in [facility]  PHQ-2 Total Score  1  2  0  0  0  PHQ-9 Total Score  -  9  -  -  -       Assessment and Plan: Patient is a 23 year old female with a history of depression anxiety and ADD.  She is a bit more anxious recently so we will increase Paxil to 30 mg at bedtime.  She will continue trazodone 100 mg at bedtime for sleep and Wellbutrin XL 300 mg in the morning for depression as well.  She will continue gabapentin 300 mg 3 times daily for anxiety as well as Klonopin 0.5 mg twice daily as needed for breakthrough anxiety.  She will continue Adderall XR 20 mg every morning as well as Adderall 10 mg later in the day for ADD.  She will return to see me in 3 months   Levonne Spiller, MD 06/01/2018, 9:33 AM

## 2018-06-06 ENCOUNTER — Other Ambulatory Visit: Payer: Self-pay | Admitting: Family Medicine

## 2018-06-08 DIAGNOSIS — H5203 Hypermetropia, bilateral: Secondary | ICD-10-CM | POA: Diagnosis not present

## 2018-06-08 DIAGNOSIS — H52223 Regular astigmatism, bilateral: Secondary | ICD-10-CM | POA: Diagnosis not present

## 2018-06-22 DIAGNOSIS — H5213 Myopia, bilateral: Secondary | ICD-10-CM | POA: Diagnosis not present

## 2018-06-22 DIAGNOSIS — H04123 Dry eye syndrome of bilateral lacrimal glands: Secondary | ICD-10-CM | POA: Diagnosis not present

## 2018-06-29 ENCOUNTER — Telehealth: Payer: Self-pay | Admitting: Family Medicine

## 2018-06-29 ENCOUNTER — Other Ambulatory Visit: Payer: Self-pay | Admitting: Family Medicine

## 2018-06-29 ENCOUNTER — Telehealth (HOSPITAL_COMMUNITY): Payer: Self-pay | Admitting: *Deleted

## 2018-06-29 DIAGNOSIS — N926 Irregular menstruation, unspecified: Secondary | ICD-10-CM

## 2018-06-29 NOTE — Telephone Encounter (Signed)
Dr Tenny Craw Patient's Mom called stating that the patient is having some "serious mood swings".  Behaviors are either  Very erratic, super high manic, racing around, can't sit still to Really low depression, can't get of bed depression , no motivation. Patient is background giving her symptoms. Last appointment  06/01/2018   Next appointment 08/31/2018. Asked if sooner appointment is needed or can something be suggested?

## 2018-06-29 NOTE — Telephone Encounter (Signed)
Patient aware referral has been placed.

## 2018-06-29 NOTE — Telephone Encounter (Signed)
We discussed if symptoms were to become uncontrolled, we would refer.  I am going to send to OB/GYN for further evaluation.

## 2018-06-29 NOTE — Telephone Encounter (Signed)
Appointment scheduled fro 07/01/2018/@ 10:40 AM

## 2018-06-29 NOTE — Telephone Encounter (Signed)
Try to get her in this week if possible, I'm out next week

## 2018-06-30 ENCOUNTER — Other Ambulatory Visit: Payer: Self-pay | Admitting: Family Medicine

## 2018-07-01 ENCOUNTER — Ambulatory Visit (INDEPENDENT_AMBULATORY_CARE_PROVIDER_SITE_OTHER): Payer: Medicaid Other | Admitting: Psychiatry

## 2018-07-01 ENCOUNTER — Telehealth (HOSPITAL_COMMUNITY): Payer: Self-pay | Admitting: *Deleted

## 2018-07-01 ENCOUNTER — Encounter (HOSPITAL_COMMUNITY): Payer: Self-pay | Admitting: Psychiatry

## 2018-07-01 DIAGNOSIS — F331 Major depressive disorder, recurrent, moderate: Secondary | ICD-10-CM | POA: Diagnosis not present

## 2018-07-01 MED ORDER — DIVALPROEX SODIUM ER 250 MG PO TB24: ORAL_TABLET | ORAL | 2 refills | Status: DC

## 2018-07-01 MED ORDER — AMPHETAMINE-DEXTROAMPHET ER 20 MG PO CP24: 20.0000 mg | ORAL_CAPSULE | Freq: Every day | ORAL | 0 refills | Status: DC

## 2018-07-01 MED ORDER — CLONAZEPAM 0.5 MG PO TABS: 0.5000 mg | ORAL_TABLET | Freq: Two times a day (BID) | ORAL | 2 refills | Status: DC | PRN

## 2018-07-01 MED ORDER — BUPROPION HCL ER (XL) 300 MG PO TB24: 300.0000 mg | ORAL_TABLET | ORAL | 2 refills | Status: DC

## 2018-07-01 MED ORDER — AMPHETAMINE-DEXTROAMPHETAMINE 10 MG PO TABS: 10.0000 mg | ORAL_TABLET | Freq: Every day | ORAL | 0 refills | Status: DC

## 2018-07-01 MED ORDER — GABAPENTIN 300 MG PO CAPS: 300.0000 mg | ORAL_CAPSULE | Freq: Three times a day (TID) | ORAL | 2 refills | Status: DC

## 2018-07-01 NOTE — Telephone Encounter (Signed)
[facility] TRACK BRAND NAME PREFERRED. NO P.A. REQUIRED  ADDERALL Xr

## 2018-07-01 NOTE — Progress Notes (Signed)
BH MD/PA/NP OP Progress Note  07/01/2018 11:08 AM Samantha Tucker  MRN:  [id number]  Chief Complaint:  Chief Complaint    Depression; Anxiety; ADD; Manic Behavior     HPI: This patient is a 23 year old single white female who lives with her family in [place].  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  She is seen as a work in today.  Last time we did increase Paxil because she claimed that she was waking up with anxiety and feeling very agitated.  The mother states however that the pharmacy never got the increased dosage and she still just taking the 20 mg at bedtime.  They are here today because they note that she has severe mood swings.  She goes from being very agitated hyperactive with racing thoughts and irritability to periods of being very down and unmotivated unable to get out of bed and feeling "dread" about leaving the house.  She denies any self-harm or thoughts of suicide.  These mood swings seem to have gotten worse in the last several weeks.  She is already on Wellbutrin and Adderall gabapentin and Paxil as well as clonazepam as needed and trazodone as needed.  I suggested taking away the Paxil since it does not seem to have helped and adding Depakote at bedtime to help with mood stabilization.  She and mom are agreeable.  She is also being followed by endocrinology regarding thyroidism and has another appointment next month. Visit Diagnosis:    ICD-10-CM   1. Major depressive disorder, recurrent episode, moderate (HCC) F33.1 buPROPion (WELLBUTRIN XL) 300 MG 24 hr tablet    Past Psychiatric History: Long-term outpatient treatment  Past Medical History:  Past Medical History:  Diagnosis Date  . ADHD (attention deficit hyperactivity disorder)   . Allergy   . Anxiety   . Constipation   . Environmental allergies   . Nausea   . Postural hypotension     Past Surgical History:   Procedure Laterality Date  . TYMPANOSTOMY TUBE PLACEMENT Bilateral [id number]    Family Psychiatric History: See below  Family History:  Family History  Problem Relation Age of Onset  . Bipolar disorder Father   . Bipolar disorder Sister   . Bipolar disorder Brother   . Thyroid disease Brother   . Bipolar disorder Paternal Uncle   . Alcohol abuse Paternal Uncle   . Bipolar disorder Maternal Grandmother   . Bipolar disorder Paternal Grandfather   . Alcohol abuse Paternal Grandfather   . Bipolar disorder Paternal Grandmother   . Bipolar disorder Brother   . Bipolar disorder Brother     Social History:  Social History   Socioeconomic History  . Marital status: Single    Spouse name: Not on file  . Number of children: Not on file  . Years of education: Not on file  . Highest education level: Not on file  Occupational History  . Not on file  Social Needs  . Financial resource strain: Not on file  . Food insecurity:    Worry: Not on file    Inability: Not on file  . Transportation needs:    Medical: Not on file    Non-medical: Not on file  Tobacco Use  . Smoking status: Never Smoker  . Smokeless tobacco: Never Used  Substance and Sexual Activity  . Alcohol use: No    Comment: 12-27-15 per pt no   .  Drug use: No    Comment: 12-27-15 per pt no   . Sexual activity: Never  Lifestyle  . Physical activity:    Days per week: Not on file    Minutes per session: Not on file  . Stress: Not on file  Relationships  . Social connections:    Talks on phone: Not on file    Gets together: Not on file    Attends religious service: Not on file    Active member of club or organization: Not on file    Attends meetings of clubs or organizations: Not on file    Relationship status: Not on file  Other Topics Concern  . Not on file  Social History Narrative  . Not on file    Allergies:  Allergies  Allergen Reactions  . Other Hives and Shortness Of Breath    Develops hives  with exposure to cold air.   . Claritin [Loratadine]     Fainting spells  . Sulfa Antibiotics   . Latex Hives    Metabolic Disorder Labs: No results found for: HGBA1C, MPG No results found for: PROLACTIN No results found for: CHOL, TRIG, HDL, CHOLHDL, VLDL, LDLCALC Lab Results  Component Value Date   TSH 4.790 (H) 02/17/2018   TSH 3.08 01/14/2018    Therapeutic Level Labs: No results found for: LITHIUM No results found for: VALPROATE No components found for:  CBMZ  Current Medications: Current Outpatient Medications  Medication Sig Dispense Refill  . albuterol (PROVENTIL HFA;VENTOLIN HFA) 108 (90 Base) MCG/ACT inhaler Inhale 2 puffs into the lungs every 6 (six) hours as needed for wheezing or shortness of breath. 1 Inhaler 0  . amphetamine-dextroamphetamine (ADDERALL XR) 20 MG 24 hr capsule Take 1 capsule (20 mg total) by mouth daily. 30 capsule 0  . amphetamine-dextroamphetamine (ADDERALL XR) 20 MG 24 hr capsule Take 1 capsule (20 mg total) by mouth daily. 30 capsule 0  . amphetamine-dextroamphetamine (ADDERALL XR) 20 MG 24 hr capsule Take 1 capsule (20 mg total) by mouth daily. 30 capsule 0  . amphetamine-dextroamphetamine (ADDERALL) 10 MG tablet Take 1 tablet (10 mg total) by mouth daily. 30 tablet 0  . amphetamine-dextroamphetamine (ADDERALL) 10 MG tablet Take 1 tablet (10 mg total) by mouth daily. 30 tablet 0  . amphetamine-dextroamphetamine (ADDERALL) 10 MG tablet Take 1 tablet (10 mg total) by mouth daily. 30 tablet 0  . ampicillin (PRINCIPEN) 500 MG capsule TK 1 C PO D  2  . buPROPion (WELLBUTRIN XL) 300 MG 24 hr tablet Take 1 tablet (300 mg total) by mouth every morning. 90 tablet 2  . clindamycin-benzoyl peroxide (BENZACLIN) gel APPLY ON THE SKIN DAILY TO FACE AT NIGHT  3  . clonazePAM (KLONOPIN) 0.5 MG tablet Take 1 tablet (0.5 mg total) by mouth 2 (two) times daily as needed for anxiety. 60 tablet 2  . desloratadine (CLARINEX) 5 MG tablet Take 5 mg by mouth daily.     . fluticasone (FLONASE) 50 MCG/ACT nasal spray Place 2 sprays into both nostrils daily. 16 g 4  . gabapentin (NEURONTIN) 300 MG capsule Take 1 capsule (300 mg total) by mouth 3 (three) times daily. 270 capsule 2  . levothyroxine (SYNTHROID, LEVOTHROID) 25 MCG tablet Take 1 tablet (25 mcg total) by mouth daily before breakfast. 90 tablet 3  . montelukast (SINGULAIR) 10 MG tablet Take 10 mg by mouth at bedtime.    . naproxen (NAPROSYN) 500 MG tablet TAKE 1 TAB BY MOUTH 2 (TWO) TIMES DAILY WITH  A MEAL. AS NEEDED FOR MENSTRUAL CRAMPING. 30 tablet 0  . norgestimate-ethinyl estradiol (ORTHO-CYCLEN, 28,) 0.25-35 MG-MCG tablet Take 1 tablet by mouth daily. 1 Package 12  . Olopatadine HCl (PATANASE NA) Place into the nose daily. Reported on 05/26/2015    . Olopatadine HCl 0.6 % SOLN U 2 SPRAYS IEN BID  1  . ondansetron (ZOFRAN) 4 MG tablet Take 1 tablet (4 mg total) by mouth every 8 (eight) hours as needed for nausea or vomiting. 30 tablet 1  . spironolactone (ALDACTONE) 25 MG tablet TK 1 T PO BID  2  . traZODone (DESYREL) 100 MG tablet Take 1 tablet (100 mg total) by mouth at bedtime. 90 tablet 2  . divalproex (DEPAKOTE ER) 250 MG 24 hr tablet Take one tablet at bedtime for one week, then increase to two tablets at bedtime 60 tablet 2   No current facility-administered medications for this visit.      Musculoskeletal: Strength & Muscle Tone: within normal limits Gait & Station: normal Patient leans: N/A  Psychiatric Specialty Exam: Review of Systems  Neurological: Positive for dizziness.  Psychiatric/Behavioral: Positive for depression. The patient is nervous/anxious.   All other systems reviewed and are negative.   Blood pressure 127/83, pulse 94, height 5\' 1"  (1.549 m), weight 138 lb (62.6 kg), SpO2 98 %.Body mass index is 26.07 kg/m.  General Appearance: Casual and Fairly Groomed  Eye Contact:  Good  Speech:  Clear and Coherent  Volume:  Normal  Mood:  Anxious and Dysphoric  Affect:   Flat  Thought Process:  Goal Directed  Orientation:  Full (Time, Place, and Person)  Thought Content: Rumination   Suicidal Thoughts:  No  Homicidal Thoughts:  No  Memory:  Immediate;   Good Recent;   Good Remote;   Fair  Judgement:  Fair  Insight:  Fair  Psychomotor Activity:  Decreased  Concentration:  Concentration: Fair and Attention Span: Fair  Recall:  Good  Fund of Knowledge: Good  Language: Good  Akathisia:  No  Handed:  Right  AIMS (if indicated): not done  Assets:  Communication Skills Desire for Improvement Physical Health Resilience Social Support Talents/Skills  ADL's:  Intact  Cognition: WNL  Sleep:  Good   Screenings: PHQ2-9     Office Visit from 02/17/2018 in [facility] Office Visit from 08/22/2017 in [facility] Office Visit from 08/27/2016 in [facility] Clinical Support from 11/01/2015 in [facility] Office Visit from 05/28/2013 in [facility]  PHQ-2 Total Score  1  2  0  0  0  PHQ-9 Total Score  -  9  -  -  -       Assessment and Plan:  This patient is a 23 year old female with a complicated family history with numerous people have severe bipolar disorder.  She herself has pots and hypothyroidism already as well as significant anxiety and depression as well as what she describes as these new manic symptoms.  She will discontinue Paxil but continue Wellbutrin XL 300 mg daily for depression.  She will start Depakote ER 250 mg at bedtime for 1 week and then advance to 500 mg at bedtime for mood stabilization.  She will continue Adderall XR 20 mg every morning for focus and Adderall 10 mg later in the day also for focus.  She will continue clonazepam 0.5 mg 2 times daily as needed for anxiety and trazodone 50 mg at bedtime as needed  for sleep.  She will also continue gabapentin 300 mg 3 times daily for anxiety.  She will return to see me in 4  weeks Diannia Rudereborah , MD 07/01/2018, 11:08 AM

## 2018-07-02 ENCOUNTER — Ambulatory Visit (HOSPITAL_COMMUNITY): Payer: Medicaid Other | Admitting: Psychiatry

## 2018-07-06 DIAGNOSIS — H52223 Regular astigmatism, bilateral: Secondary | ICD-10-CM | POA: Diagnosis not present

## 2018-07-06 DIAGNOSIS — H04123 Dry eye syndrome of bilateral lacrimal glands: Secondary | ICD-10-CM | POA: Diagnosis not present

## 2018-07-06 DIAGNOSIS — H5203 Hypermetropia, bilateral: Secondary | ICD-10-CM | POA: Diagnosis not present

## 2018-07-20 ENCOUNTER — Encounter: Payer: Self-pay | Admitting: Endocrinology

## 2018-07-20 ENCOUNTER — Other Ambulatory Visit: Payer: Self-pay

## 2018-07-20 ENCOUNTER — Ambulatory Visit (INDEPENDENT_AMBULATORY_CARE_PROVIDER_SITE_OTHER): Payer: Medicaid Other | Admitting: Endocrinology

## 2018-07-20 VITALS — BP 112/78 | HR 94 | Ht 61.0 in | Wt 142.4 lb

## 2018-07-20 DIAGNOSIS — E039 Hypothyroidism, unspecified: Secondary | ICD-10-CM | POA: Diagnosis not present

## 2018-07-20 LAB — T4, FREE: Free T4: 0.73 ng/dL (ref 0.60–1.60)

## 2018-07-20 LAB — TSH: TSH: 5 u[IU]/mL (ref 0.35–5.50)

## 2018-07-20 NOTE — Patient Instructions (Signed)
In view of your medical condition, you should avoid pregnancy until we have decided it is safe Blood tests are requested for you today.  We'll let you know about the results.  Please come back for a follow-up appointment in 6 months.

## 2018-07-20 NOTE — Progress Notes (Signed)
Subjective:    Patient ID: Samantha Tucker, female    DOB: May 01, 1996, 23 y.o.   MRN: [id number]  HPI Pt returns for f/u of hypothyroidism (dx'ed early 2019; she was rx'ed low-dosage synthroid; she has never had thyroid imaging; she is not at risk for pregnancy).  pt reports fatigue, dry skin, and constipation.   Past Medical History:  Diagnosis Date  . ADHD (attention deficit hyperactivity disorder)   . Allergy   . Anxiety   . Constipation   . Environmental allergies   . Nausea   . Postural hypotension     Past Surgical History:  Procedure Laterality Date  . TYMPANOSTOMY TUBE PLACEMENT Bilateral [id number]    Social History   Socioeconomic History  . Marital status: Single    Spouse name: Not on file  . Number of children: Not on file  . Years of education: Not on file  . Highest education level: Not on file  Occupational History  . Not on file  Social Needs  . Financial resource strain: Not on file  . Food insecurity:    Worry: Not on file    Inability: Not on file  . Transportation needs:    Medical: Not on file    Non-medical: Not on file  Tobacco Use  . Smoking status: Never Smoker  . Smokeless tobacco: Never Used  Substance and Sexual Activity  . Alcohol use: No    Comment: 12-27-15 per pt no   . Drug use: No    Comment: 12-27-15 per pt no   . Sexual activity: Never  Lifestyle  . Physical activity:    Days per week: Not on file    Minutes per session: Not on file  . Stress: Not on file  Relationships  . Social connections:    Talks on phone: Not on file    Gets together: Not on file    Attends religious service: Not on file    Active member of club or organization: Not on file    Attends meetings of clubs or organizations: Not on file    Relationship status: Not on file  . Intimate partner violence:    Fear of current or ex partner: Not on file    Emotionally abused: Not on file    Physically abused: Not on file    Forced sexual activity: Not on file    Other Topics Concern  . Not on file  Social History Narrative  . Not on file    Current Outpatient Medications on File Prior to Visit  Medication Sig Dispense Refill  . albuterol (PROVENTIL HFA;VENTOLIN HFA) 108 (90 Base) MCG/ACT inhaler Inhale 2 puffs into the lungs every 6 (six) hours as needed for wheezing or shortness of breath. 1 Inhaler 0  . amphetamine-dextroamphetamine (ADDERALL) 10 MG tablet Take 1 tablet (10 mg total) by mouth daily. 30 tablet 0  . buPROPion (WELLBUTRIN XL) 300 MG 24 hr tablet Take 1 tablet (300 mg total) by mouth every morning. 90 tablet 2  . clindamycin-benzoyl peroxide (BENZACLIN) gel APPLY ON THE SKIN DAILY TO FACE AT NIGHT  3  . clonazePAM (KLONOPIN) 0.5 MG tablet Take 1 tablet (0.5 mg total) by mouth 2 (two) times daily as needed for anxiety. 60 tablet 2  . desloratadine (CLARINEX) 5 MG tablet Take 5 mg by mouth daily.    . divalproex (DEPAKOTE ER) 250 MG 24 hr tablet Take one tablet at bedtime for one week, then increase to two tablets at  bedtime 60 tablet 2  . fluticasone (FLONASE) 50 MCG/ACT nasal spray Place 2 sprays into both nostrils daily. 16 g 4  . gabapentin (NEURONTIN) 300 MG capsule Take 1 capsule (300 mg total) by mouth 3 (three) times daily. 270 capsule 2  . levothyroxine (SYNTHROID, LEVOTHROID) 25 MCG tablet Take 1 tablet (25 mcg total) by mouth daily before breakfast. 90 tablet 3  . montelukast (SINGULAIR) 10 MG tablet Take 10 mg by mouth at bedtime.    . naproxen (NAPROSYN) 500 MG tablet TAKE 1 TAB BY MOUTH 2 (TWO) TIMES DAILY WITH A MEAL. AS NEEDED FOR MENSTRUAL CRAMPING. 30 tablet 0  . norgestimate-ethinyl estradiol (ORTHO-CYCLEN, 28,) 0.25-35 MG-MCG tablet Take 1 tablet by mouth daily. 1 Package 12  . Olopatadine HCl (PATANASE NA) Place into the nose daily. Reported on 05/26/2015    . Olopatadine HCl 0.6 % SOLN U 2 SPRAYS IEN BID  1  . ondansetron (ZOFRAN) 4 MG tablet Take 1 tablet (4 mg total) by mouth every 8 (eight) hours as needed for  nausea or vomiting. 30 tablet 1  . spironolactone (ALDACTONE) 25 MG tablet TK 1 T PO BID  2  . traZODone (DESYREL) 100 MG tablet Take 1 tablet (100 mg total) by mouth at bedtime. 90 tablet 2   No current facility-administered medications on file prior to visit.     Allergies  Allergen Reactions  . Other Hives and Shortness Of Breath    Develops hives with exposure to cold air.   . Claritin [Loratadine]     Fainting spells  . Sulfa Antibiotics   . Latex Hives    Family History  Problem Relation Age of Onset  . Bipolar disorder Father   . Bipolar disorder Sister   . Bipolar disorder Brother   . Thyroid disease Brother   . Bipolar disorder Paternal Uncle   . Alcohol abuse Paternal Uncle   . Bipolar disorder Maternal Grandmother   . Bipolar disorder Paternal Grandfather   . Alcohol abuse Paternal Grandfather   . Bipolar disorder Paternal Grandmother   . Bipolar disorder Brother   . Bipolar disorder Brother     BP 112/78 (BP Location: Right Arm, Patient Position: Sitting, Cuff Size: Normal)   Pulse 94   Ht 5\' 1"  (1.549 m)   Wt 142 lb 6.4 oz (64.6 kg)   SpO2 95%   BMI 26.91 kg/m   Review of Systems Denies neck swelling.      Objective:   Physical Exam VITAL SIGNS:  See vs page GENERAL: no distress NECK: There is no palpable thyroid enlargement.  No thyroid nodule is palpable.  No palpable lymphadenopathy at the anterior neck.   Lab Results  Component Value Date   TSH 5.00 07/20/2018   T3TOTAL 157 08/22/2017   T4TOTAL 6.6 05/28/2013      Assessment & Plan:  Constipation, and other sxs: not thyroid-related.   Patient Instructions  In view of your medical condition, you should avoid pregnancy until we have decided it is safe Blood tests are requested for you today.  We'll let you know about the results.  Please come back for a follow-up appointment in 6 months.

## 2018-07-22 ENCOUNTER — Encounter: Payer: Self-pay | Admitting: Family Medicine

## 2018-07-28 ENCOUNTER — Other Ambulatory Visit: Payer: Self-pay | Admitting: Family Medicine

## 2018-07-31 ENCOUNTER — Ambulatory Visit (HOSPITAL_COMMUNITY): Payer: Medicaid Other | Admitting: Psychiatry

## 2018-08-12 ENCOUNTER — Other Ambulatory Visit: Payer: Self-pay | Admitting: Family Medicine

## 2018-08-12 NOTE — Telephone Encounter (Signed)
Last seen 02/17/18.

## 2018-08-31 ENCOUNTER — Ambulatory Visit (HOSPITAL_COMMUNITY): Payer: Medicaid Other | Admitting: Psychiatry

## 2018-08-31 ENCOUNTER — Telehealth: Payer: Self-pay | Admitting: Family Medicine

## 2018-08-31 NOTE — Telephone Encounter (Signed)
The patient's mother called regarding the upcoming visit. Informed the patient of the virtual visit and how to download the [employer]. No questions at this time.

## 2018-09-02 ENCOUNTER — Encounter: Payer: Self-pay | Admitting: Obstetrics & Gynecology

## 2018-09-02 ENCOUNTER — Other Ambulatory Visit: Payer: Self-pay

## 2018-09-02 ENCOUNTER — Encounter: Payer: Self-pay | Admitting: Obstetrics and Gynecology

## 2018-09-02 ENCOUNTER — Telehealth: Payer: Self-pay | Admitting: Family Medicine

## 2018-09-02 ENCOUNTER — Ambulatory Visit (INDEPENDENT_AMBULATORY_CARE_PROVIDER_SITE_OTHER): Payer: Medicaid Other | Admitting: Obstetrics & Gynecology

## 2018-09-02 DIAGNOSIS — E039 Hypothyroidism, unspecified: Secondary | ICD-10-CM | POA: Diagnosis not present

## 2018-09-02 DIAGNOSIS — N938 Other specified abnormal uterine and vaginal bleeding: Secondary | ICD-10-CM

## 2018-09-02 MED ORDER — NORGESTREL-ETHINYL ESTRADIOL 0.3-30 MG-MCG PO TABS: 1.0000 | ORAL_TABLET | Freq: Every day | ORAL | 11 refills | Status: DC

## 2018-09-02 NOTE — Telephone Encounter (Signed)
Aware. Ortho and Arletha Grippe Cyclen are other birth control pills prescribed in past.

## 2018-09-02 NOTE — Progress Notes (Signed)
   TELEHEALTH VIRTUAL GYNECOLOGY VISIT ENCOUNTER NOTE  I connected with [name] on 09/02/18 at 10:35 AM EDT by WebEx at home and verified that I am speaking with the correct person using two identifiers.   I discussed the limitations, risks, security and privacy concerns of performing an evaluation and management service by telephone and the availability of in person appointments. I also discussed with the patient that there may be a patient responsible charge related to this service. The patient expressed understanding and agreed to proceed.   History:  Samantha Tucker is a 23 y.o. single [id number] female being evaluated today for irregular periods. Menarche at 89. Her periods were monthly until about 2017. Since then they are irregular, sometimes 2 times per month, painful.   She was started on OCPs in 2018, tried various brands. Her periods are still more than once per month on OCPs. She has not had a gyn u/s, no recent blood work. She was seen by an endocrinologist Dr. Rennis Harding in [place]. She was diagnosed with an abnormal thyroid and was started on meds about 9 months ago. She was seen about 3 months ago and had her level checked and was told that it was normal.      Past Medical History:  Diagnosis Date  . ADHD (attention deficit hyperactivity disorder)   . Allergy   . Anxiety   . Constipation   . Environmental allergies   . Nausea   . Postural hypotension    Past Surgical History:  Procedure Laterality Date  . TYMPANOSTOMY TUBE PLACEMENT Bilateral [id number]   The following portions of the patient's history were reviewed and updated as appropriate: allergies, current medications, past family history, past medical history, past social history, past surgical history and problem list.      Review of Systems:  Pertinent items noted in HPI and remainder of comprehensive ROS otherwise negative. She is virginal.  Physical Exam:   General:  Alert, oriented and cooperative.   Mental  Status: Normal mood and affect perceived. Normal judgment and thought content.  Physical exam deferred due to nature of the encounter  Labs and Imaging No results found for this or any previous visit (from the past 336 hour(s)). No results found.    Assessment and Plan:     AUB- gyn u/s ordered Change triphasic OCPs to lo ovral Tele visit in 4 weeks      I discussed the assessment and treatment plan with the patient. The patient was provided an opportunity to ask questions and all were answered. The patient agreed with the plan and demonstrated an understanding of the instructions.   The patient was advised to call back or seek an in-person evaluation/go to the ED if the symptoms worsen or if the condition fails to improve as anticipated.  I provided 10 minutes of non-face-to-face time during this encounter.   Allie Bossier, MD [facility] [employer], [facility]

## 2018-09-03 ENCOUNTER — Other Ambulatory Visit: Payer: Self-pay | Admitting: Family Medicine

## 2018-09-04 NOTE — Telephone Encounter (Signed)
Last seen 02/17/18.

## 2018-09-18 ENCOUNTER — Other Ambulatory Visit: Payer: Self-pay

## 2018-09-18 ENCOUNTER — Encounter (HOSPITAL_COMMUNITY): Payer: Self-pay | Admitting: Psychiatry

## 2018-09-18 ENCOUNTER — Ambulatory Visit (INDEPENDENT_AMBULATORY_CARE_PROVIDER_SITE_OTHER): Payer: Medicaid Other | Admitting: Psychiatry

## 2018-09-18 DIAGNOSIS — F331 Major depressive disorder, recurrent, moderate: Secondary | ICD-10-CM

## 2018-09-18 DIAGNOSIS — F988 Other specified behavioral and emotional disorders with onset usually occurring in childhood and adolescence: Secondary | ICD-10-CM | POA: Diagnosis not present

## 2018-09-18 MED ORDER — AMPHETAMINE-DEXTROAMPHET ER 20 MG PO CP24: 20.0000 mg | ORAL_CAPSULE | ORAL | 0 refills | Status: DC

## 2018-09-18 MED ORDER — CLONAZEPAM 0.5 MG PO TABS: 0.5000 mg | ORAL_TABLET | Freq: Two times a day (BID) | ORAL | 2 refills | Status: DC | PRN

## 2018-09-18 MED ORDER — AMPHETAMINE-DEXTROAMPHETAMINE 10 MG PO TABS: 10.0000 mg | ORAL_TABLET | Freq: Every day | ORAL | 0 refills | Status: DC

## 2018-09-18 MED ORDER — GABAPENTIN 300 MG PO CAPS: 300.0000 mg | ORAL_CAPSULE | Freq: Three times a day (TID) | ORAL | 2 refills | Status: DC

## 2018-09-18 MED ORDER — TRAZODONE HCL 100 MG PO TABS: 100.0000 mg | ORAL_TABLET | Freq: Every day | ORAL | 2 refills | Status: DC

## 2018-09-18 MED ORDER — DIVALPROEX SODIUM ER 250 MG PO TB24: ORAL_TABLET | ORAL | 2 refills | Status: DC

## 2018-09-18 MED ORDER — BUPROPION HCL ER (XL) 300 MG PO TB24: 300.0000 mg | ORAL_TABLET | ORAL | 2 refills | Status: DC

## 2018-09-18 NOTE — Progress Notes (Signed)
Virtual Visit via Video Note  I connected with Samantha Tucker on 09/18/18 at  9:00 AM EDT by a video enabled telemedicine application and verified that I am speaking with the correct person using two identifiers.   I discussed the limitations of evaluation and management by telemedicine and the availability of in person appointments. The patient expressed understanding and agreed to proceed.      I discussed the assessment and treatment plan with the patient. The patient was provided an opportunity to ask questions and all were answered. The patient agreed with the plan and demonstrated an understanding of the instructions.   The patient was advised to call back or seek an in-person evaluation if the symptoms worsen or if the condition fails to improve as anticipated.  I provided 15 minutes of non-face-to-face time during this encounter.   Samantha Ruder, MD  [facility] MD/PA/NP OP Progress Note  09/18/2018 9:34 AM Samantha Tucker  MRN:  [id number]  Chief Complaint:  Chief Complaint    Depression; Anxiety; ADD; Follow-up     HPI: This patient is a 23 year old single white female who lives with her family in [place].  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  The patient is seen after about 2-1/2 months.  Last time she stated she was having significant mood swings so we added Depakote ER and she is up to 500 mg at bedtime.  She feels better and is having less mood swings and irritability.  She states that some of this does come back during her premenstrual and menstrual time.  I urged her to take the clonazepam twice daily during that time.  She denies being seriously depressed or suicidal.  She is working up to 7 hours a day at home right now because of the coronavirus pandemic.  She is not taking many breaks.  She is having a hard time staying focused.  I told her that she would be more productive if she took a lunch break and went outside and got fresh  air and also stood up every hour and took a little walk and stretch break.  In the past she had been on Adderall XR 20 mg and we can reinstate this.  Right now she is only on 10 mg and it is not going well in terms of focus.  Overall however her mood is generally good and she is not overly anxious.  She is still enjoying playing video games at night with friends online. Visit Diagnosis:    ICD-10-CM   1. 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 Major depressive disorder, recurrent episode, moderate (HCC) F33.1 buPROPion (WELLBUTRIN XL) 300 MG 24 hr tablet    Past Psychiatric History: Long-term outpatient treatment  Past Medical History:  Past Medical History:  Diagnosis Date  . ADHD (attention deficit hyperactivity disorder)   . Allergy   . Anxiety   . Constipation   . Environmental allergies   . Nausea   . Postural hypotension     Past Surgical History:  Procedure Laterality Date  . TYMPANOSTOMY TUBE PLACEMENT Bilateral [id number]    Family Psychiatric History: see below  Family History:  Family History  Problem Relation Age of Onset  . Bipolar disorder Father   . Bipolar disorder Sister   . Bipolar disorder Brother   . Thyroid disease Brother   . Bipolar disorder Paternal Uncle   . Alcohol abuse Paternal Uncle   . Bipolar  disorder Maternal Grandmother   . Bipolar disorder Paternal Grandfather   . Alcohol abuse Paternal Grandfather   . Bipolar disorder Paternal Grandmother   . Bipolar disorder Brother   . Bipolar disorder Brother     Social History:  Social History   Socioeconomic History  . Marital status: Single    Spouse name: Not on file  . Number of children: Not on file  . Years of education: Not on file  . Highest education level: Not on file  Occupational History  . Not on file  Social Needs  . Financial resource strain: Not on file  . Food insecurity:    Worry: Not on file    Inability: Not on file  . Transportation needs:     Medical: Not on file    Non-medical: Not on file  Tobacco Use  . Smoking status: Never Smoker  . Smokeless tobacco: Never Used  Substance and Sexual Activity  . Alcohol use: No    Comment: 12-27-15 per pt no   . Drug use: No    Comment: 12-27-15 per pt no   . Sexual activity: Never  Lifestyle  . Physical activity:    Days per week: Not on file    Minutes per session: Not on file  . Stress: Not on file  Relationships  . Social connections:    Talks on phone: Not on file    Gets together: Not on file    Attends religious service: Not on file    Active member of club or organization: Not on file    Attends meetings of clubs or organizations: Not on file    Relationship status: Not on file  Other Topics Concern  . Not on file  Social History Narrative  . Not on file    Allergies:  Allergies  Allergen Reactions  . Other Hives and Shortness Of Breath    Develops hives with exposure to cold air.   . Claritin [Loratadine]     Fainting spells  . Sulfa Antibiotics   . Latex Hives    Metabolic Disorder Labs: No results found for: HGBA1C, MPG No results found for: PROLACTIN No results found for: CHOL, TRIG, HDL, CHOLHDL, VLDL, LDLCALC Lab Results  Component Value Date   TSH 5.00 07/20/2018   TSH 4.790 (H) 02/17/2018    Therapeutic Level Labs: No results found for: LITHIUM No results found for: VALPROATE No components found for:  CBMZ  Current Medications: Current Outpatient Medications  Medication Sig Dispense Refill  . albuterol (PROVENTIL HFA;VENTOLIN HFA) 108 (90 Base) MCG/ACT inhaler Inhale 2 puffs into the lungs every 6 (six) hours as needed for wheezing or shortness of breath. 1 Inhaler 0  . amphetamine-dextroamphetamine (ADDERALL XR) 20 MG 24 hr capsule Take 1 capsule (20 mg total) by mouth every morning. 30 capsule 0  . amphetamine-dextroamphetamine (ADDERALL XR) 20 MG 24 hr capsule Take 1 capsule (20 mg total) by mouth every morning. 30 capsule 0  .  amphetamine-dextroamphetamine (ADDERALL XR) 20 MG 24 hr capsule Take 1 capsule (20 mg total) by mouth every morning. 30 capsule 0  . amphetamine-dextroamphetamine (ADDERALL) 10 MG tablet Take 1 tablet (10 mg total) by mouth daily. 30 tablet 0  . amphetamine-dextroamphetamine (ADDERALL) 10 MG tablet Take 1 tablet (10 mg total) by mouth daily. 30 tablet 0  . amphetamine-dextroamphetamine (ADDERALL) 10 MG tablet Take 1 tablet (10 mg total) by mouth daily. 90 tablet 0  . amphetamine-dextroamphetamine (ADDERALL) 10 MG tablet Take 1 tablet (  10 mg total) by mouth daily. 90 tablet 0  . buPROPion (WELLBUTRIN XL) 300 MG 24 hr tablet Take 1 tablet (300 mg total) by mouth every morning. 90 tablet 2  . clindamycin-benzoyl peroxide (BENZACLIN) gel APPLY ON THE SKIN DAILY TO FACE AT NIGHT  3  . clonazePAM (KLONOPIN) 0.5 MG tablet Take 1 tablet (0.5 mg total) by mouth 2 (two) times daily as needed for anxiety. 60 tablet 2  . desloratadine (CLARINEX) 5 MG tablet Take 5 mg by mouth daily.    . divalproex (DEPAKOTE ER) 250 MG 24 hr tablet Take one tablet at bedtime for one week, then increase to two tablets at bedtime 60 tablet 2  . fluticasone (FLONASE) 50 MCG/ACT nasal spray Place 2 sprays into both nostrils daily. 16 g 4  . gabapentin (NEURONTIN) 300 MG capsule Take 1 capsule (300 mg total) by mouth 3 (three) times daily. 270 capsule 2  . levothyroxine (SYNTHROID, LEVOTHROID) 25 MCG tablet Take 1 tablet (25 mcg total) by mouth daily before breakfast. 90 tablet 3  . montelukast (SINGULAIR) 10 MG tablet Take 10 mg by mouth at bedtime.    . naproxen (NAPROSYN) 500 MG tablet TAKE 1 TAB BY MOUTH 2 (TWO) TIMES DAILY WITH A MEAL. AS NEEDED FOR MENSTRUAL CRAMPING. 30 tablet 0  . norgestrel-ethinyl estradiol (LO/OVRAL) 0.3-30 MG-MCG tablet Take 1 tablet by mouth daily. 1 Package 11  . Olopatadine HCl (PATANASE NA) Place into the nose daily. Reported on 05/26/2015    . Olopatadine HCl 0.6 % SOLN U 2 SPRAYS IEN BID  1  .  ondansetron (ZOFRAN) 4 MG tablet Take 1 tablet (4 mg total) by mouth every 8 (eight) hours as needed for nausea or vomiting. 30 tablet 1  . spironolactone (ALDACTONE) 25 MG tablet TK 1 T PO BID  2  . traZODone (DESYREL) 100 MG tablet Take 1 tablet (100 mg total) by mouth at bedtime. 90 tablet 2   No current facility-administered medications for this visit.      Musculoskeletal: Strength & Muscle Tone: within normal limits Gait & Station: normal Patient leans: N/A  Psychiatric Specialty Exam: Review of Systems  Psychiatric/Behavioral: The patient is nervous/anxious.   All other systems reviewed and are negative.   Last menstrual period 08/23/2018.There is no height or weight on file to calculate BMI.  General Appearance: Casual and Fairly Groomed  Eye Contact:  Good  Speech:  Clear and Coherent  Volume:  Normal  Mood:  Anxious  Affect:  Appropriate and Congruent  Thought Process:  Goal Directed  Orientation:  Full (Time, Place, and Person)  Thought Content: WDL   Suicidal Thoughts:  No  Homicidal Thoughts:  No  Memory:  Immediate;   Good Recent;   Good Remote;   Fair  Judgement:  Good  Insight:  Fair  Psychomotor Activity:  Normal  Concentration:  Concentration: Poor and Attention Span: Poor  Recall:  Good  Fund of Knowledge: Good  Language: Good  Akathisia:  No  Handed:  Right  AIMS (if indicated): not done  Assets:  Communication Skills Desire for Improvement Physical Health Resilience Social Support Talents/Skills  ADL's:  Intact  Cognition: WNL  Sleep:  Good   Screenings: PHQ2-9     [facility] Visit from [facility]/17/2018 in [facility] Visit from 08/27/2016 in [facility] Clinical Support from 11/[facility]/2015 in [facility] Visit from [facility]/28/2013 [facility]  PHQ-2 Total Score  1  2  0  0  0  PHQ-9 Total  Score  -  9  -  -  -       Assessment and Plan: This patient is a 23 year old female with a history of depression anxiety irritability, possibly bipolar disorder as well as ADD.  She is doing well in terms of mood but is having trouble focusing.  We will therefore reinstate Adderall XR 20 mg every morning and she can continue Adderall 10 mg later in the day as needed for focus.  She will continue gabapentin 300 mg 3 times daily for anxiety, Wellbutrin XL 300 mg daily for depression, trazodone 100 mg at bedtime for sleep, Depakote ER 500 mg at bedtime for mood stabilization and clonazepam 0.5 mg twice daily as needed for anxiety which she does need to take during her premenstrual and menstrual time.  She will return to see me in 3 months or call sooner if needed   Samantha Ruder, MD 09/18/2018, 9:34 AM

## 2018-09-21 ENCOUNTER — Other Ambulatory Visit: Payer: Self-pay

## 2018-09-21 ENCOUNTER — Telehealth (HOSPITAL_COMMUNITY): Payer: Self-pay | Admitting: *Deleted

## 2018-09-21 ENCOUNTER — Other Ambulatory Visit (HOSPITAL_COMMUNITY): Payer: Self-pay | Admitting: Psychiatry

## 2018-09-21 MED ORDER — AMPHETAMINE-DEXTROAMPHETAMINE 10 MG PO TABS: 10.0000 mg | ORAL_TABLET | Freq: Every day | ORAL | 0 refills | Status: DC

## 2018-09-21 NOTE — Telephone Encounter (Signed)
sent 

## 2018-09-21 NOTE — Telephone Encounter (Signed)
Dr Tenny Craw [facility] Samantha Tucker has 2 10 mg Adderall scripts for 90 day supply & they need to be resent 2 scripts for   30 day supply

## 2018-09-30 ENCOUNTER — Ambulatory Visit: Payer: Self-pay | Admitting: Obstetrics & Gynecology

## 2018-10-08 ENCOUNTER — Other Ambulatory Visit: Payer: Self-pay

## 2018-10-08 ENCOUNTER — Ambulatory Visit (HOSPITAL_COMMUNITY)
Admission: RE | Admit: 2018-10-08 | Discharge: 2018-10-08 | Disposition: A | Discharge status: Home / Self Care | Payer: Medicaid Other | Source: Ambulatory Visit | Attending: Obstetrics & Gynecology | Admitting: Obstetrics & Gynecology

## 2018-10-08 DIAGNOSIS — N938 Other specified abnormal uterine and vaginal bleeding: Secondary | ICD-10-CM | POA: Insufficient documentation

## 2018-10-09 ENCOUNTER — Ambulatory Visit (INDEPENDENT_AMBULATORY_CARE_PROVIDER_SITE_OTHER): Payer: Medicaid Other | Admitting: Obstetrics & Gynecology

## 2018-10-09 ENCOUNTER — Encounter: Payer: Self-pay | Admitting: Obstetrics & Gynecology

## 2018-10-09 VITALS — BP 114/63 | HR 105 | Temp 98.2°F | Wt 134.2 lb

## 2018-10-09 DIAGNOSIS — N938 Other specified abnormal uterine and vaginal bleeding: Secondary | ICD-10-CM

## 2018-10-09 NOTE — Progress Notes (Signed)
   Subjective:    Patient ID: PA COLMENERO, female    DOB: Sep 10, 1995, 23 y.o.   MRN: [id number]  HPI 23 yo single P0 here to discuss her u/s results. This was done for evaluation of DUB. I started her on Lo ovral last month. This has helped the pain and the bleeding. She is not having any other problems.    Review of Systems She got Gardasil when she was younger. She is virginal. She works in Consulting civil engineer.     Objective:   Physical Exam Breathing, conversing, and ambulating normally Well nourished, well hydrated White female, no apparent distress Abd- benign     Assessment & Plan:  Preventative care- discussed rec for pap  She prefers to delay the pap smear until COVID is past and her mom can be here. Continue Lo ovral Come back 6 months

## 2018-12-17 ENCOUNTER — Ambulatory Visit (INDEPENDENT_AMBULATORY_CARE_PROVIDER_SITE_OTHER): Payer: Medicaid Other | Admitting: Psychiatry

## 2018-12-17 ENCOUNTER — Other Ambulatory Visit (HOSPITAL_COMMUNITY): Payer: Self-pay | Admitting: Psychiatry

## 2018-12-17 ENCOUNTER — Other Ambulatory Visit: Payer: Self-pay

## 2018-12-17 ENCOUNTER — Encounter (HOSPITAL_COMMUNITY): Payer: Self-pay | Admitting: Psychiatry

## 2018-12-17 DIAGNOSIS — F331 Major depressive disorder, recurrent, moderate: Secondary | ICD-10-CM

## 2018-12-17 DIAGNOSIS — F988 Other specified behavioral and emotional disorders with onset usually occurring in childhood and adolescence: Secondary | ICD-10-CM

## 2018-12-17 MED ORDER — TRAZODONE HCL 100 MG PO TABS: 100.0000 mg | ORAL_TABLET | Freq: Every day | ORAL | 2 refills | Status: DC

## 2018-12-17 MED ORDER — AMPHETAMINE-DEXTROAMPHET ER 30 MG PO CP24: 30.0000 mg | ORAL_CAPSULE | Freq: Every day | ORAL | 0 refills | Status: DC

## 2018-12-17 MED ORDER — GABAPENTIN 300 MG PO CAPS: 300.0000 mg | ORAL_CAPSULE | Freq: Three times a day (TID) | ORAL | 2 refills | Status: DC

## 2018-12-17 MED ORDER — CLONAZEPAM 0.5 MG PO TABS: 0.5000 mg | ORAL_TABLET | Freq: Two times a day (BID) | ORAL | 2 refills | Status: DC | PRN

## 2018-12-17 MED ORDER — AMPHETAMINE-DEXTROAMPHETAMINE 10 MG PO TABS: 10.0000 mg | ORAL_TABLET | Freq: Every day | ORAL | 0 refills | Status: DC

## 2018-12-17 MED ORDER — BUPROPION HCL ER (XL) 300 MG PO TB24: 300.0000 mg | ORAL_TABLET | ORAL | 2 refills | Status: DC

## 2018-12-17 MED ORDER — DIVALPROEX SODIUM ER 250 MG PO TB24: ORAL_TABLET | ORAL | 2 refills | Status: DC

## 2018-12-17 NOTE — Progress Notes (Signed)
Virtual Visit via Video Note  I connected with Samantha Tucker on 12/17/18 at 10:20 AM EDT by a video enabled telemedicine application and verified that I am speaking with the correct person using two identifiers.   I discussed the limitations of evaluation and management by telemedicine and the availability of in person appointments. The patient expressed understanding and agreed to proceed.     I discussed the assessment and treatment plan with the patient. The patient was provided an opportunity to ask questions and all were answered. The patient agreed with the plan and demonstrated an understanding of the instructions.   The patient was advised to call back or seek an in-person evaluation if the symptoms worsen or if the condition fails to improve as anticipated.  I provided 15 minutes of non-face-to-face time during this encounter.   Diannia Rudereborah Linetta Regner, MD  [facility] MD/PA/NP OP Progress Note  12/17/2018 10:38 AM Samantha Tucker  MRN:  [id number]  Chief Complaint:  Chief Complaint    ADD; Depression; Anxiety; Follow-up     HPI: This patient is a 23 year old single white female who lives [place] family in [place].  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  The patient returns after 3 months.  She states in general she is doing well with her mood swings.  The combination of Wellbutrin Depakote and gabapentin seem to be helping.  The clonazepam is used when needed for anxiety.  She is not seriously depressed or suicidal by her report.  Currently she is working from home because she is under quarantine.  1 of her sisters coworkers had coronavirus and so her sister and the rest the family have to be under quarantine for 14 days.  The patient states that she is still not focusing as well as she would like.  She is on Adderall XR 20 mg and I suggested we move it up to 30 mg and she is in agreement.  She is sleeping well with the trazodone Visit Diagnosis:   ICD-10-CM   1. 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 Major depressive disorder, recurrent episode, moderate (HCC)  F33.1 buPROPion (WELLBUTRIN XL) 300 MG 24 hr tablet    Past Psychiatric History: Long-term outpatient treatment  Past Medical History:  Past Medical History:  Diagnosis Date  . ADHD (attention deficit hyperactivity disorder)   . Allergy   . Anxiety   . Constipation   . Environmental allergies   . Nausea   . Postural hypotension     Past Surgical History:  Procedure Laterality Date  . TYMPANOSTOMY TUBE PLACEMENT Bilateral [id number]    Family Psychiatric History: See below  Family History:  Family History  Problem Relation Age of Onset  . Bipolar disorder Father   . Bipolar disorder Sister   . Bipolar disorder Brother   . Thyroid disease Brother   . Bipolar disorder Paternal Uncle   . Alcohol abuse Paternal Uncle   . Bipolar disorder Maternal Grandmother   . Bipolar disorder Paternal Grandfather   . Alcohol abuse Paternal Grandfather   . Bipolar disorder Paternal Grandmother   . Bipolar disorder Brother   . Bipolar disorder Brother     Social History:  Social History   Socioeconomic History  . Marital status: Single    Spouse name: Not on file  . Number of children: Not on file  . Years of education: Not on file  . Highest education level: Not  on file  Occupational History  . Not on file  Social Needs  . Financial resource strain: Not on file  . Food insecurity    Worry: Not on file    Inability: Not on file  . Transportation needs    Medical: Not on file    Non-medical: Not on file  Tobacco Use  . Smoking status: Never Smoker  . Smokeless tobacco: Never Used  Substance and Sexual Activity  . Alcohol use: No    Comment: 12-27-15 per pt no   . Drug use: No    Comment: 12-27-15 per pt no   . Sexual activity: Never  Lifestyle  . Physical activity    Days per week: Not on file    Minutes per session: Not on file   . Stress: Not on file  Relationships  . Social Herbalist on phone: Not on file    Gets together: Not on file    Attends religious service: Not on file    Active member of club or organization: Not on file    Attends meetings of clubs or organizations: Not on file    Relationship status: Not on file  Other Topics Concern  . Not on file  Social History Narrative  . Not on file    Allergies:  Allergies  Allergen Reactions  . Other Hives and Shortness Of Breath    Develops hives with exposure to cold air.   . Claritin [Loratadine]     Fainting spells  . Sulfa Antibiotics   . Latex Hives    Metabolic Disorder Labs: No results found for: HGBA1C, MPG No results found for: PROLACTIN No results found for: CHOL, TRIG, HDL, CHOLHDL, VLDL, LDLCALC Lab Results  Component Value Date   TSH 5.00 07/20/2018   TSH 4.790 (H) 02/17/2018    Therapeutic Level Labs: No results found for: LITHIUM No results found for: VALPROATE No components found for:  CBMZ  Current Medications: Current Outpatient Medications  Medication Sig Dispense Refill  . albuterol (PROVENTIL HFA;VENTOLIN HFA) 108 (90 Base) MCG/ACT inhaler Inhale 2 puffs into the lungs every 6 (six) hours as needed for wheezing or shortness of breath. 1 Inhaler 0  . amphetamine-dextroamphetamine (ADDERALL XR) 30 MG 24 hr capsule Take 1 capsule (30 mg total) by mouth daily. 30 capsule 0  . amphetamine-dextroamphetamine (ADDERALL XR) 30 MG 24 hr capsule Take 1 capsule (30 mg total) by mouth daily. 30 capsule 0  . amphetamine-dextroamphetamine (ADDERALL XR) 30 MG 24 hr capsule Take 1 capsule (30 mg total) by mouth daily. 30 capsule 0  . amphetamine-dextroamphetamine (ADDERALL) 10 MG tablet Take 1 tablet (10 mg total) by mouth daily. 30 tablet 0  . amphetamine-dextroamphetamine (ADDERALL) 10 MG tablet Take 1 tablet (10 mg total) by mouth daily. 30 tablet 0  . amphetamine-dextroamphetamine (ADDERALL) 10 MG tablet Take 1 tablet  (10 mg total) by mouth daily. 30 tablet 0  . buPROPion (WELLBUTRIN XL) 300 MG 24 hr tablet Take 1 tablet (300 mg total) by mouth every morning. 90 tablet 2  . clindamycin-benzoyl peroxide (BENZACLIN) gel APPLY ON THE SKIN DAILY TO FACE AT NIGHT  3  . clonazePAM (KLONOPIN) 0.5 MG tablet Take 1 tablet (0.5 mg total) by mouth 2 (two) times daily as needed for anxiety. 60 tablet 2  . desloratadine (CLARINEX) 5 MG tablet Take 5 mg by mouth daily.    . divalproex (DEPAKOTE ER) 250 MG 24 hr tablet Take one tablet at bedtime for  one week, then increase to two tablets at bedtime 60 tablet 2  . fluticasone (FLONASE) 50 MCG/ACT nasal spray Place 2 sprays into both nostrils daily. 16 g 4  . gabapentin (NEURONTIN) 300 MG capsule Take 1 capsule (300 mg total) by mouth 3 (three) times daily. 270 capsule 2  . levothyroxine (SYNTHROID, LEVOTHROID) 25 MCG tablet Take 1 tablet (25 mcg total) by mouth daily before breakfast. 90 tablet 3  . montelukast (SINGULAIR) 10 MG tablet Take 10 mg by mouth at bedtime.    . naproxen (NAPROSYN) 500 MG tablet TAKE 1 TAB BY MOUTH 2 (TWO) TIMES DAILY WITH A MEAL. AS NEEDED FOR MENSTRUAL CRAMPING. 30 tablet 0  . norgestrel-ethinyl estradiol (LO/OVRAL) 0.3-30 MG-MCG tablet Take 1 tablet by mouth daily. 1 Package 11  . Olopatadine HCl (PATANASE NA) Place into the nose daily. Reported on 05/26/2015    . Olopatadine HCl 0.6 % SOLN U 2 SPRAYS IEN BID  1  . ondansetron (ZOFRAN) 4 MG tablet Take 1 tablet (4 mg total) by mouth every 8 (eight) hours as needed for nausea or vomiting. 30 tablet 1  . spironolactone (ALDACTONE) 25 MG tablet TK 1 T PO BID  2  . traZODone (DESYREL) 100 MG tablet Take 1 tablet (100 mg total) by mouth at bedtime. 90 tablet 2   No current facility-administered medications for this visit.      Musculoskeletal: Strength & Muscle Tone: within normal limits Gait & Station: normal Patient leans: N/A  Psychiatric Specialty Exam: Review of Systems  All other systems  reviewed and are negative.   There were no vitals taken for this visit.There is no height or weight on file to calculate BMI.  General Appearance: Casual and Fairly Groomed  Eye Contact:  Good  Speech:  Clear and Coherent  Volume:  Normal  Mood:  Euthymic  Affect:  Appropriate and Congruent  Thought Process:  Goal Directed  Orientation:  Full (Time, Place, and Person)  Thought Content: WDL   Suicidal Thoughts:  No  Homicidal Thoughts:  No  Memory:  Immediate;   Good Recent;   Good Remote;   Good  Judgement:  Good  Insight:  Fair  Psychomotor Activity:  Normal  Concentration:  Concentration: Fair and Attention Span: Fair  Recall:  Good  Fund of Knowledge: Good  Language: Good  Akathisia:  No  Handed:  Right  AIMS (if indicated): not done  Assets:  Communication Skills Desire for Improvement Physical Health Resilience Social Support Talents/Skills  ADL's:  Intact  Cognition: WNL  Sleep:  Good   Screenings: PHQ2-9     Office Visit from 02/17/2018 in [facility] Office Visit from [facility]22/2017 in [facility] Visit from 08/27/2016 in [facility] Clinical Support from 11/01/2015 in [facility] Visit from 05/28/2013 in [facility]  PHQ-2 Total Score  1  2  0  0  0  PHQ-9 Total Score  -  9  -  -  -       Assessment and Plan: This patient is a 23 year old female with a history of depression, anxiety, irritability possible bipolar disorder and ADD.  She is doing well in terms of mood but continues to have trouble with focus.  We will therefore increase Adderall XR to 30 mg each morning and she can continue Adderall 10 mg later in the day as needed for focus.  She will continue gabapentin 300 mg 3 times  daily for anxiety, Wellbutrin XL 300 mg daily for depression, trazodone 100 mg at bedtime for sleep, Depakote ER 500 mg at bedtime for mood stabilization and  clonazepam 0.5 mg twice daily as needed for anxiety.  She will return to see me in 3 months or call sooner if needed   Diannia Rudereborah Shermika Balthaser, MD 12/17/2018, 10:38 AM

## 2018-12-21 ENCOUNTER — Telehealth (HOSPITAL_COMMUNITY): Payer: Self-pay | Admitting: *Deleted

## 2018-12-21 NOTE — Telephone Encounter (Signed)
P.A. NOT REQUIRED FOR AMPHETAMINE- DEXTROAMPHETAMINE ER 30 MG ER CAPSULES  RX MUST JUST USE THE BRAND NAME ONLY ADDERALL

## 2018-12-22 DIAGNOSIS — J301 Allergic rhinitis due to pollen: Secondary | ICD-10-CM | POA: Diagnosis not present

## 2018-12-22 DIAGNOSIS — L503 Dermatographic urticaria: Secondary | ICD-10-CM | POA: Diagnosis not present

## 2018-12-22 DIAGNOSIS — J3081 Allergic rhinitis due to animal (cat) (dog) hair and dander: Secondary | ICD-10-CM | POA: Diagnosis not present

## 2018-12-22 DIAGNOSIS — R05 Cough: Secondary | ICD-10-CM | POA: Diagnosis not present

## 2019-01-13 ENCOUNTER — Encounter (HOSPITAL_COMMUNITY): Payer: Self-pay | Admitting: Psychiatry

## 2019-01-13 ENCOUNTER — Ambulatory Visit (INDEPENDENT_AMBULATORY_CARE_PROVIDER_SITE_OTHER): Payer: Medicaid Other | Admitting: Psychiatry

## 2019-01-13 ENCOUNTER — Other Ambulatory Visit: Payer: Self-pay

## 2019-01-13 DIAGNOSIS — F988 Other specified behavioral and emotional disorders with onset usually occurring in childhood and adolescence: Secondary | ICD-10-CM

## 2019-01-13 DIAGNOSIS — F331 Major depressive disorder, recurrent, moderate: Secondary | ICD-10-CM | POA: Diagnosis not present

## 2019-01-13 MED ORDER — DIVALPROEX SODIUM ER 250 MG PO TB24: ORAL_TABLET | ORAL | 2 refills | Status: DC

## 2019-01-13 NOTE — Progress Notes (Signed)
Virtual Visit via Video Note  I connected with Samantha Tucker on 01/13/19 at  9:40 AM EDT by a video enabled telemedicine application and verified that I am speaking with the correct person using two identifiers.   I discussed the limitations of evaluation and management by telemedicine and the availability of in person appointments. The patient expressed understanding and agreed to proceed.     I discussed the assessment and treatment plan with the patient. The patient was provided an opportunity to ask questions and all were answered. The patient agreed with the plan and demonstrated an understanding of the instructions.   The patient was advised to call back or seek an in-person evaluation if the symptoms worsen or if the condition fails to improve as anticipated.  I provided 15 minutes of non-face-to-face time during this encounter.   Diannia Ruder, MD  [facility] MD/PA/NP OP Progress Note  01/13/2019 10:14 AM Samantha Tucker  MRN:  [id number]  Chief Complaint:  Chief Complaint    Depression; Anxiety; Follow-up     HPI: This patient is a 23 year old single white female who lives with her family in [place].  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  She states that recently she has not been doing well.  She describes rapidly shifting moods going from sad to tearful irritable and  Agitated.  These last for 30 to 40 minutes and then go away.  We had started Depakote and she is up to 500 mg daily but this may not be enough.  She has had a few new stressors she has started school although she claims she really likes it it is a new change for her.  Also her older sister had to move back into the home and they are sharing a bedroom.  She and her sister get along well but still again and this is another change.  She states that she is enjoying her school she does  not have any thoughts of suicide or self-harm and she is sleeping well most of the time.  She is able to stay focused with her medication.  I suggested that we increase her Depakote to 750 mg daily and check a blood level after about 1 week and make adjustments from there. Visit Diagnosis:    ICD-10-CM   1. Major depressive disorder, recurrent episode, moderate (HCC)  F33.1   2. 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 ADHD (attention deficit hyperactivity disorder)   . Allergy   . Anxiety   . Constipation   . Environmental allergies   . Nausea   . Postural hypotension     Past Surgical History:  Procedure Laterality Date  . TYMPANOSTOMY TUBE PLACEMENT Bilateral [id number]    Family Psychiatric History: see below  Family History:  Family History  Problem Relation Age of Onset  . Bipolar disorder Father   . Bipolar disorder Sister   . Bipolar disorder Brother   . Thyroid disease Brother   . Bipolar disorder Paternal Uncle   . Alcohol abuse Paternal Uncle   . Bipolar disorder Maternal Grandmother   . Bipolar disorder Paternal Grandfather   . Alcohol abuse Paternal Grandfather   . Bipolar disorder Paternal Grandmother   .  Bipolar disorder Brother   . Bipolar disorder Brother     Social History:  Social History   Socioeconomic History  . Marital status: Single    Spouse name: Not on file  . Number of children: Not on file  . Years of education: Not on file  . Highest education level: Not on file  Occupational History  . Not on file  Social Needs  . Financial resource strain: Not on file  . Food insecurity    Worry: Not on file    Inability: Not on file  . Transportation needs    Medical: Not on file    Non-medical: Not on file  Tobacco Use  . Smoking status: Never Smoker  . Smokeless tobacco: Never Used   Substance and Sexual Activity  . Alcohol use: No    Comment: 12-27-15 per pt no   . Drug use: No    Comment: 12-27-15 per pt no   . Sexual activity: Never  Lifestyle  . Physical activity    Days per week: Not on file    Minutes per session: Not on file  . Stress: Not on file  Relationships  . Social Musicianconnections    Talks on phone: Not on file    Gets together: Not on file    Attends religious service: Not on file    Active member of club or organization: Not on file    Attends meetings of clubs or organizations: Not on file    Relationship status: Not on file  Other Topics Concern  . Not on file  Social History Narrative  . Not on file    Allergies:  Allergies  Allergen Reactions  . Other Hives and Shortness Of Breath    Develops hives with exposure to cold air.   . Claritin [Loratadine]     Fainting spells  . Sulfa Antibiotics   . Latex Hives    Metabolic Disorder Labs: No results found for: HGBA1C, MPG No results found for: PROLACTIN No results found for: CHOL, TRIG, HDL, CHOLHDL, VLDL, LDLCALC Lab Results  Component Value Date   TSH 5.00 07/20/2018   TSH 4.790 (H) 02/17/2018    Therapeutic Level Labs: No results found for: LITHIUM No results found for: VALPROATE No components found for:  CBMZ  Current Medications: Current Outpatient Medications  Medication Sig Dispense Refill  . albuterol (PROVENTIL HFA;VENTOLIN HFA) 108 (90 Base) MCG/ACT inhaler Inhale 2 puffs into the lungs every 6 (six) hours as needed for wheezing or shortness of breath. 1 Inhaler 0  . amphetamine-dextroamphetamine (ADDERALL XR) 30 MG 24 hr capsule Take 1 capsule (30 mg total) by mouth daily. 30 capsule 0  . amphetamine-dextroamphetamine (ADDERALL XR) 30 MG 24 hr capsule Take 1 capsule (30 mg total) by mouth daily. 30 capsule 0  . amphetamine-dextroamphetamine (ADDERALL XR) 30 MG 24 hr capsule Take 1 capsule (30 mg total) by mouth daily. 30 capsule 0  . amphetamine-dextroamphetamine  (ADDERALL) 10 MG tablet Take 1 tablet (10 mg total) by mouth daily. 30 tablet 0  . amphetamine-dextroamphetamine (ADDERALL) 10 MG tablet Take 1 tablet (10 mg total) by mouth daily. 30 tablet 0  . amphetamine-dextroamphetamine (ADDERALL) 10 MG tablet Take 1 tablet (10 mg total) by mouth daily. 30 tablet 0  . buPROPion (WELLBUTRIN XL) 300 MG 24 hr tablet Take 1 tablet (300 mg total) by mouth every morning. 90 tablet 2  . clindamycin-benzoyl peroxide (BENZACLIN) gel APPLY ON THE SKIN DAILY TO FACE AT NIGHT  3  .  clonazePAM (KLONOPIN) 0.5 MG tablet Take 1 tablet (0.5 mg total) by mouth 2 (two) times daily as needed for anxiety. 60 tablet 2  . desloratadine (CLARINEX) 5 MG tablet Take 5 mg by mouth daily.    . divalproex (DEPAKOTE ER) 250 MG 24 hr tablet Take three tablets at bedtime 90 tablet 2  . fluticasone (FLONASE) 50 MCG/ACT nasal spray Place 2 sprays into both nostrils daily. 16 g 4  . gabapentin (NEURONTIN) 300 MG capsule Take 1 capsule (300 mg total) by mouth 3 (three) times daily. 270 capsule 2  . levothyroxine (SYNTHROID, LEVOTHROID) 25 MCG tablet Take 1 tablet (25 mcg total) by mouth daily before breakfast. 90 tablet 3  . montelukast (SINGULAIR) 10 MG tablet Take 10 mg by mouth at bedtime.    . naproxen (NAPROSYN) 500 MG tablet TAKE 1 TAB BY MOUTH 2 (TWO) TIMES DAILY WITH A MEAL. AS NEEDED FOR MENSTRUAL CRAMPING. 30 tablet 0  . norgestrel-ethinyl estradiol (LO/OVRAL) 0.3-30 MG-MCG tablet Take 1 tablet by mouth daily. 1 Package 11  . Olopatadine HCl (PATANASE NA) Place into the nose daily. Reported on 05/26/2015    . Olopatadine HCl 0.6 % SOLN U 2 SPRAYS IEN BID  1  . ondansetron (ZOFRAN) 4 MG tablet Take 1 tablet (4 mg total) by mouth every 8 (eight) hours as needed for nausea or vomiting. 30 tablet 1  . spironolactone (ALDACTONE) 25 MG tablet TK 1 T PO BID  2  . traZODone (DESYREL) 100 MG tablet Take 1 tablet (100 mg total) by mouth at bedtime. 90 tablet 2   No current  facility-administered medications for this visit.      Musculoskeletal: Strength & Muscle Tone: within normal limits Gait & Station: normal Patient leans: N/A  Psychiatric Specialty Exam: Review of Systems  Psychiatric/Behavioral: Positive for depression. The patient is nervous/anxious.   All other systems reviewed and are negative.   There were no vitals taken for this visit.There is no height or weight on file to calculate BMI.  General Appearance: Casual and Fairly Groomed  Eye Contact:  Good  Speech:  Clear and Coherent  Volume:  Normal  Mood:  Anxious and Dysphoric  Affect:  Depressed and Tearful  Thought Process:  Goal Directed  Orientation:  Full (Time, Place, and Person)  Thought Content: Rumination   Suicidal Thoughts:  No  Homicidal Thoughts:  No  Memory:  Immediate;   Good Recent;   Good Remote;   Good  Judgement:  Good  Insight:  Fair  Psychomotor Activity:  Normal  Concentration:  Concentration: Good and Attention Span: Good  Recall:  Good  Fund of Knowledge: Good  Language: Good  Akathisia:  No  Handed:  Right  AIMS (if indicated): not done  Assets:  Communication Skills Desire for Improvement Physical Health Resilience Social Support Talents/Skills  ADL's:  Intact  Cognition: WNL  Sleep:  Good   Screenings: PHQ2-9     Office Visit from 02/17/2018 in [place] Office Visit from 08/22/2017 in [facility] Visit from 08/27/2016 in [place] from 11/01/2015 in [place] Visit from 05/28/2013 in [place]  PHQ-2 Total Score  1  2  0  0  0  PHQ-9 Total Score  -  9  -  -  -       Assessment and Plan: This patient is a 22 year old female with a history of depression anxiety irritability  and possible bipolar disorder as well as ADD.  Right now she is having more difficulty with mood stabilization and rapidly  shifting moods.  We will therefore to increase Depakote ER to 750 mg at bedtime.  She will continue Adderall XR 30 mg in the morning and regular Adderall 10 mg later in the day for focus, gabapentin 300 mg 3 times daily for anxiety, Wellbutrin XL 300 mg daily for depression, trazodone 100 mg at bedtime for sleep and clonazepam 0.5 mg twice daily as needed for anxiety.  She will return to see me in 4 weeks.  She has been instructed to come to our office to get a lab slip and check her Depakote level in approximately 10 days   Diannia Rudereborah Huyen Perazzo, MD 01/13/2019, 10:14 AM

## 2019-01-15 ENCOUNTER — Other Ambulatory Visit: Payer: Self-pay

## 2019-01-20 ENCOUNTER — Ambulatory Visit (INDEPENDENT_AMBULATORY_CARE_PROVIDER_SITE_OTHER): Payer: Medicaid Other | Admitting: Endocrinology

## 2019-01-20 ENCOUNTER — Other Ambulatory Visit (HOSPITAL_COMMUNITY): Payer: Self-pay | Admitting: Psychiatry

## 2019-01-20 ENCOUNTER — Other Ambulatory Visit: Payer: Self-pay

## 2019-01-20 ENCOUNTER — Ambulatory Visit: Payer: Self-pay | Admitting: Endocrinology

## 2019-01-20 DIAGNOSIS — E039 Hypothyroidism, unspecified: Secondary | ICD-10-CM | POA: Diagnosis not present

## 2019-01-20 DIAGNOSIS — F331 Major depressive disorder, recurrent, moderate: Secondary | ICD-10-CM

## 2019-01-20 NOTE — Progress Notes (Signed)
Subjective:    Patient ID: ADRIE Tucker, female    DOB: Apr 06, 1996, 23 y.o.   MRN: [id number]  HPI Pt returns for f/u of hypothyroidism (dx'ed early 2019; she was rx'ed low-dosage synthroid; she has never had thyroid imaging; she is not at risk for pregnancy).  pt states she feels well in general.  She denies neck swelling Pt says POTS is well-controlled.   Past Medical History:  Diagnosis Date  . ADHD (attention deficit hyperactivity disorder)   . Allergy   . Anxiety   . Constipation   . Environmental allergies   . Nausea   . Postural hypotension     Past Surgical History:  Procedure Laterality Date  . TYMPANOSTOMY TUBE PLACEMENT Bilateral [id number]    Social History   Socioeconomic History  . Marital status: Single    Spouse name: Not on file  . Number of children: Not on file  . Years of education: Not on file  . Highest education level: Not on file  Occupational History  . Not on file  Social Needs  . Financial resource strain: Not on file  . Food insecurity    Worry: Not on file    Inability: Not on file  . Transportation needs    Medical: Not on file    Non-medical: Not on file  Tobacco Use  . Smoking status: Never Smoker  . Smokeless tobacco: Never Used  Substance and Sexual Activity  . Alcohol use: No    Comment: 12-27-15 per pt no   . Drug use: No    Comment: 12-27-15 per pt no   . Sexual activity: Never  Lifestyle  . Physical activity    Days per week: Not on file    Minutes per session: Not on file  . Stress: Not on file  Relationships  . Social Herbalist on phone: Not on file    Gets together: Not on file    Attends religious service: Not on file    Active member of club or organization: Not on file    Attends meetings of clubs or organizations: Not on file    Relationship status: Not on file  . Intimate partner violence    Fear of current or ex partner: Not on file    Emotionally abused: Not on file    Physically abused: Not on  file    Forced sexual activity: Not on file  Other Topics Concern  . Not on file  Social History Narrative  . Not on file    Current Outpatient Medications on File Prior to Visit  Medication Sig Dispense Refill  . albuterol (PROVENTIL HFA;VENTOLIN HFA) 108 (90 Base) MCG/ACT inhaler Inhale 2 puffs into the lungs every 6 (six) hours as needed for wheezing or shortness of breath. 1 Inhaler 0  . amphetamine-dextroamphetamine (ADDERALL XR) 30 MG 24 hr capsule Take 1 capsule (30 mg total) by mouth daily. 30 capsule 0  . amphetamine-dextroamphetamine (ADDERALL XR) 30 MG 24 hr capsule Take 1 capsule (30 mg total) by mouth daily. 30 capsule 0  . amphetamine-dextroamphetamine (ADDERALL XR) 30 MG 24 hr capsule Take 1 capsule (30 mg total) by mouth daily. 30 capsule 0  . amphetamine-dextroamphetamine (ADDERALL) 10 MG tablet Take 1 tablet (10 mg total) by mouth daily. 30 tablet 0  . amphetamine-dextroamphetamine (ADDERALL) 10 MG tablet Take 1 tablet (10 mg total) by mouth daily. 30 tablet 0  . amphetamine-dextroamphetamine (ADDERALL) 10 MG tablet Take 1 tablet (10  mg total) by mouth daily. 30 tablet 0  . buPROPion (WELLBUTRIN XL) 300 MG 24 hr tablet Take 1 tablet (300 mg total) by mouth every morning. 90 tablet 2  . clindamycin-benzoyl peroxide (BENZACLIN) gel APPLY ON THE SKIN DAILY TO FACE AT NIGHT  3  . clonazePAM (KLONOPIN) 0.5 MG tablet Take 1 tablet (0.5 mg total) by mouth 2 (two) times daily as needed for anxiety. 60 tablet 2  . desloratadine (CLARINEX) 5 MG tablet Take 5 mg by mouth daily.    . divalproex (DEPAKOTE ER) 250 MG 24 hr tablet Take three tablets at bedtime 90 tablet 2  . fluticasone (FLONASE) 50 MCG/ACT nasal spray Place 2 sprays into both nostrils daily. 16 g 4  . gabapentin (NEURONTIN) 300 MG capsule Take 1 capsule (300 mg total) by mouth 3 (three) times daily. 270 capsule 2  . montelukast (SINGULAIR) 10 MG tablet Take 10 mg by mouth at bedtime.    . naproxen (NAPROSYN) 500 MG  tablet TAKE 1 TAB BY MOUTH 2 (TWO) TIMES DAILY WITH A MEAL. AS NEEDED FOR MENSTRUAL CRAMPING. 30 tablet 0  . norgestrel-ethinyl estradiol (LO/OVRAL) 0.3-30 MG-MCG tablet Take 1 tablet by mouth daily. 1 Package 11  . Olopatadine HCl (PATANASE NA) Place into the nose daily. Reported on 05/26/2015    . Olopatadine HCl 0.6 % SOLN U 2 SPRAYS IEN BID  1  . ondansetron (ZOFRAN) 4 MG tablet Take 1 tablet (4 mg total) by mouth every 8 (eight) hours as needed for nausea or vomiting. 30 tablet 1  . spironolactone (ALDACTONE) 25 MG tablet TK 1 T PO BID  2  . traZODone (DESYREL) 100 MG tablet Take 1 tablet (100 mg total) by mouth at bedtime. 90 tablet 2   No current facility-administered medications on file prior to visit.     Allergies  Allergen Reactions  . Other Hives and Shortness Of Breath    Develops hives with exposure to cold air.   . Claritin [Loratadine]     Fainting spells  . Sulfa Antibiotics   . Latex Hives    Family History  Problem Relation Age of Onset  . Bipolar disorder Father   . Bipolar disorder Sister   . Bipolar disorder Brother   . Thyroid disease Brother   . Bipolar disorder Paternal Uncle   . Alcohol abuse Paternal Uncle   . Bipolar disorder Maternal Grandmother   . Bipolar disorder Paternal Grandfather   . Alcohol abuse Paternal Grandfather   . Bipolar disorder Paternal Grandmother   . Bipolar disorder Brother   . Bipolar disorder Brother     There were no vitals taken for this visit.  Review of Systems Denies dry skin.      Objective:   Physical Exam   Lab Results  Component Value Date   TSH 6.39 (H) 01/22/2019   T3TOTAL 157 08/22/2017   T4TOTAL 6.6 05/28/2013      Assessment & Plan:  Hypothyroidism: she needs increased rx POTS: she needs ACTH stim test at next in-person OV  Blood tests are requested for you today.  We'll let you know about the results.  Please come back for a follow-up appointment in 6 months.  We should check ACTH stim test  then.

## 2019-01-21 ENCOUNTER — Other Ambulatory Visit: Payer: Self-pay

## 2019-01-21 ENCOUNTER — Telehealth: Payer: Self-pay | Admitting: Endocrinology

## 2019-01-21 MED ORDER — LEVOTHYROXINE SODIUM 25 MCG PO TABS: 25.0000 ug | ORAL_TABLET | Freq: Every day | ORAL | 3 refills | Status: DC

## 2019-01-21 NOTE — Telephone Encounter (Signed)
Rx sent 

## 2019-01-21 NOTE — Telephone Encounter (Signed)
MEDICATION: levothyroxine (SYNTHROID, LEVOTHROID) 25 MCG tablet  PHARMACY:  [facility] 90 DAY SUPPLY :   IS PATIENT OUT OF MEDICATION: Yes   IF NOT; HOW MUCH IS LEFT:   LAST APPOINTMENT DATE: @9 /01/2019  NEXT APPOINTMENT DATE:@Visit  date not found  DO WE HAVE YOUR PERMISSION TO LEAVE A DETAILED MESSAGE:  OTHER COMMENTS:    **Let patient know to contact pharmacy at the end of the day to make sure medication is ready. **  ** Please notify patient to allow 48-72 hours to process**  **Encourage patient to contact the pharmacy for refills or they can request refills through [facility]**

## 2019-01-22 ENCOUNTER — Other Ambulatory Visit (INDEPENDENT_AMBULATORY_CARE_PROVIDER_SITE_OTHER): Payer: Medicaid Other

## 2019-01-22 ENCOUNTER — Other Ambulatory Visit: Payer: Self-pay

## 2019-01-22 ENCOUNTER — Ambulatory Visit: Payer: Self-pay

## 2019-01-22 DIAGNOSIS — E039 Hypothyroidism, unspecified: Secondary | ICD-10-CM

## 2019-01-22 LAB — T4, FREE: Free T4: 0.88 ng/dL (ref 0.60–1.60)

## 2019-01-22 LAB — TSH: TSH: 6.39 u[IU]/mL — ABNORMAL HIGH (ref 0.35–4.50)

## 2019-01-22 MED ORDER — LEVOTHYROXINE SODIUM 50 MCG PO TABS: 50.0000 ug | ORAL_TABLET | Freq: Every day | ORAL | 3 refills | Status: DC

## 2019-01-25 ENCOUNTER — Telehealth (HOSPITAL_COMMUNITY): Payer: Self-pay | Admitting: *Deleted

## 2019-01-25 NOTE — Telephone Encounter (Signed)
I have never done this, can you try calling them

## 2019-01-25 NOTE — Telephone Encounter (Signed)
ok 

## 2019-01-25 NOTE — Telephone Encounter (Signed)
UNABLE TO MAKE CONTACT WITH [facility]. CONTINUOUS RING  & THEN DISCONNECT. INFORMED MOM OF SITUATION & ASKED IF SHE COULD COME PICK THE LAB & USE [facility]. WILL PICK TOMORROW

## 2019-01-25 NOTE — Telephone Encounter (Signed)
MOM CALLED STATED THAT [facility]VIOU[facility] WOULD DRAW LAB FOR DEPAKOTE WHEN DRAWING LABS FOR TSH. EVEN THOUGH THEY COULD SEE LAB THEY WOULDN'T THIS TIME NO EXPLANATION GIVEN. SO MOM REQUESTED IF LAB ORDER COULD BE SENT IN TO [facility] [address] [place]?

## 2019-01-26 DIAGNOSIS — F331 Major depressive disorder, recurrent, moderate: Secondary | ICD-10-CM | POA: Diagnosis not present

## 2019-01-27 LAB — VALPROIC ACID LEVEL: Valproic Acid Lvl: 74.5 mg/L (ref 50.0–100.0)

## 2019-02-01 ENCOUNTER — Other Ambulatory Visit (HOSPITAL_COMMUNITY): Payer: Self-pay | Admitting: Psychiatry

## 2019-02-01 ENCOUNTER — Telehealth (HOSPITAL_COMMUNITY): Payer: Self-pay | Admitting: *Deleted

## 2019-02-01 MED ORDER — AMPHETAMINE-DEXTROAMPHET ER 30 MG PO CP24: 30.0000 mg | ORAL_CAPSULE | Freq: Every day | ORAL | 0 refills | Status: DC

## 2019-02-01 NOTE — Telephone Encounter (Signed)
3 scripts were sent 8/6, I RESENT one today

## 2019-02-01 NOTE — Telephone Encounter (Signed)
MOM CALLED STATING THERE  IS 0 REFILL ON THE ADDERALL. I CALLED & SPOKE WITH Rx THEY HAVE ADDERALL 10 MG BUT NOT THE ADDERALL 30 MG. PLEASE SEND SCRIPT FOR THE ADDERALL 30

## 2019-02-01 NOTE — Telephone Encounter (Signed)
PER THE Rx THE 3 scripts were sent 8/6 THEY ONLY HAVE THE ADDERALL 10 MG NOT THE ADDERALL 30 MG I KEPT SAYING I HAVE A RECEIPT OF CONFIRMATION WITH EARLIEST FILL DAY BY THE PROVIDER. THEY JUST INSISTED THEY ONLY HAVE THE 10 MG ADDERALL & NOT THE 30 MG  ADDERALL.  MOM HAS BEEN NOTIFIED SCRIPT SENT

## 2019-02-10 ENCOUNTER — Ambulatory Visit (HOSPITAL_COMMUNITY): Payer: Medicaid Other | Admitting: Psychiatry

## 2019-02-10 ENCOUNTER — Other Ambulatory Visit: Payer: Self-pay

## 2019-02-11 ENCOUNTER — Ambulatory Visit (INDEPENDENT_AMBULATORY_CARE_PROVIDER_SITE_OTHER): Payer: Medicaid Other | Admitting: Psychiatry

## 2019-02-11 ENCOUNTER — Other Ambulatory Visit: Payer: Self-pay

## 2019-02-11 ENCOUNTER — Encounter (HOSPITAL_COMMUNITY): Payer: Self-pay | Admitting: Psychiatry

## 2019-02-11 DIAGNOSIS — F331 Major depressive disorder, recurrent, moderate: Secondary | ICD-10-CM

## 2019-02-11 DIAGNOSIS — F988 Other specified behavioral and emotional disorders with onset usually occurring in childhood and adolescence: Secondary | ICD-10-CM

## 2019-02-11 MED ORDER — AMPHETAMINE-DEXTROAMPHET ER 30 MG PO CP24: 30.0000 mg | ORAL_CAPSULE | Freq: Every day | ORAL | 0 refills | Status: DC

## 2019-02-11 MED ORDER — DIVALPROEX SODIUM ER 250 MG PO TB24: ORAL_TABLET | ORAL | 2 refills | Status: DC

## 2019-02-11 MED ORDER — GABAPENTIN 300 MG PO CAPS: 300.0000 mg | ORAL_CAPSULE | Freq: Three times a day (TID) | ORAL | 2 refills | Status: DC

## 2019-02-11 MED ORDER — AMPHETAMINE-DEXTROAMPHETAMINE 10 MG PO TABS: 10.0000 mg | ORAL_TABLET | Freq: Every day | ORAL | 0 refills | Status: DC

## 2019-02-11 MED ORDER — CLONAZEPAM 0.5 MG PO TABS: 0.5000 mg | ORAL_TABLET | Freq: Two times a day (BID) | ORAL | 2 refills | Status: DC | PRN

## 2019-02-11 MED ORDER — BUPROPION HCL ER (XL) 300 MG PO TB24: 300.0000 mg | ORAL_TABLET | ORAL | 2 refills | Status: DC

## 2019-02-11 MED ORDER — TRAZODONE HCL 100 MG PO TABS: 100.0000 mg | ORAL_TABLET | Freq: Every day | ORAL | 2 refills | Status: DC

## 2019-02-11 NOTE — Progress Notes (Signed)
Virtual Visit via Video Note  I connected with Samantha Tucker on 02/11/19 at 10:00 AM EDT by a video enabled telemedicine application and verified that I am speaking with the correct person using two identifiers.   I discussed the limitations of evaluation and management by telemedicine and the availability of in person appointments. The patient expressed understanding and agreed to proceed.    I discussed the assessment and treatment plan with the patient. The patient was provided an opportunity to ask questions and all were answered. The patient agreed with the plan and demonstrated an understanding of the instructions.   The patient was advised to call back or seek an in-person evaluation if the symptoms worsen or if the condition fails to improve as anticipated.  I provided 15 minutes of non-face-to-face time during this encounter.   Diannia Ruder, MD  [facility] MD/PA/NP OP Progress Note  02/11/2019 10:16 AM Samantha Tucker  MRN:  [id number]  Chief Complaint:  Chief Complaint    Depression; Anxiety; Follow-up     HPI: This patient is a 23 year old single white female who lives with her family in [place].  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  The patient returns after 4 weeks.  Last time she was describing rapidly shifting moods.  We had gone up on her Depakote to 750 mg daily and recently checked a blood level which was now 75.  This is in a good range.  She states that she feels much better and her mood has stabilized.  She is doing well with both school and work.  She is gotten used to her sister being home.  She denies depression anxiety panic attacks thoughts of self-harm or suicide.  She is sleeping well.  She feels like her current regimen is working well for her. Visit Diagnosis:    ICD-10-CM   1. 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 Major depressive disorder, recurrent episode, moderate (HCC)  F33.1  buPROPion (WELLBUTRIN XL) 300 MG 24 hr tablet    Past Psychiatric History: Long-term outpatient treatment  Past Medical History:  Past Medical History:  Diagnosis Date  . ADHD (attention deficit hyperactivity disorder)   . Allergy   . Anxiety   . Constipation   . Environmental allergies   . Nausea   . Postural hypotension     Past Surgical History:  Procedure Laterality Date  . TYMPANOSTOMY TUBE PLACEMENT Bilateral [id number]    Family Psychiatric History: See below  Family History:  Family History  Problem Relation Age of Onset  . Bipolar disorder Father   . Bipolar disorder Sister   . Bipolar disorder Brother   . Thyroid disease Brother   . Bipolar disorder Paternal Uncle   . Alcohol abuse Paternal Uncle   . Bipolar disorder Maternal Grandmother   . Bipolar disorder Paternal Grandfather   . Alcohol abuse Paternal Grandfather   . Bipolar disorder Paternal Grandmother   . Bipolar disorder Brother   . Bipolar disorder Brother     Social History:  Social History   Socioeconomic History  . Marital status: Single    Spouse name: Not on file  . Number of children: Not on file  . Years of education: Not on file  . Highest education level: Not on file  Occupational History  . Not on file  Social Needs  . Financial resource strain: Not on file  .  Food insecurity    Worry: Not on file    Inability: Not on file  . Transportation needs    Medical: Not on file    Non-medical: Not on file  Tobacco Use  . Smoking status: Never Smoker  . Smokeless tobacco: Never Used  Substance and Sexual Activity  . Alcohol use: No    Comment: 12-27-15 per pt no   . Drug use: No    Comment: 12-27-15 per pt no   . Sexual activity: Never  Lifestyle  . Physical activity    Days per week: Not on file    Minutes per session: Not on file  . Stress: Not on file  Relationships  . Social Musician on phone: Not on file    Gets together: Not on file    Attends religious  service: Not on file    Active member of club or organization: Not on file    Attends meetings of clubs or organizations: Not on file    Relationship status: Not on file  Other Topics Concern  . Not on file  Social History Narrative  . Not on file    Allergies:  Allergies  Allergen Reactions  . Other Hives and Shortness Of Breath    Develops hives with exposure to cold air.   . Claritin [Loratadine]     Fainting spells  . Sulfa Antibiotics   . Latex Hives    Metabolic Disorder Labs: No results found for: HGBA1C, MPG No results found for: PROLACTIN No results found for: CHOL, TRIG, HDL, CHOLHDL, VLDL, LDLCALC Lab Results  Component Value Date   TSH 6.39 (H) 01/22/2019   TSH 5.00 07/20/2018    Therapeutic Level Labs: No results found for: LITHIUM Lab Results  Component Value Date   VALPROATE 74.5 01/26/2019   No components found for:  CBMZ  Current Medications: Current Outpatient Medications  Medication Sig Dispense Refill  . albuterol (PROVENTIL HFA;VENTOLIN HFA) 108 (90 Base) MCG/ACT inhaler Inhale 2 puffs into the lungs every 6 (six) hours as needed for wheezing or shortness of breath. 1 Inhaler 0  . amphetamine-dextroamphetamine (ADDERALL XR) 30 MG 24 hr capsule Take 1 capsule (30 mg total) by mouth daily. 30 capsule 0  . amphetamine-dextroamphetamine (ADDERALL XR) 30 MG 24 hr capsule Take 1 capsule (30 mg total) by mouth daily. 30 capsule 0  . amphetamine-dextroamphetamine (ADDERALL XR) 30 MG 24 hr capsule Take 1 capsule (30 mg total) by mouth daily. 30 capsule 0  . amphetamine-dextroamphetamine (ADDERALL) 10 MG tablet Take 1 tablet (10 mg total) by mouth daily. 30 tablet 0  . amphetamine-dextroamphetamine (ADDERALL) 10 MG tablet Take 1 tablet (10 mg total) by mouth daily. 30 tablet 0  . amphetamine-dextroamphetamine (ADDERALL) 10 MG tablet Take 1 tablet (10 mg total) by mouth daily. 30 tablet 0  . buPROPion (WELLBUTRIN XL) 300 MG 24 hr tablet Take 1 tablet (300 mg  total) by mouth every morning. 90 tablet 2  . clindamycin-benzoyl peroxide (BENZACLIN) gel APPLY ON THE SKIN DAILY TO FACE AT NIGHT  3  . clonazePAM (KLONOPIN) 0.5 MG tablet Take 1 tablet (0.5 mg total) by mouth 2 (two) times daily as needed for anxiety. 60 tablet 2  . desloratadine (CLARINEX) 5 MG tablet Take 5 mg by mouth daily.    . divalproex (DEPAKOTE ER) 250 MG 24 hr tablet Take three tablets at bedtime 90 tablet 2  . fluticasone (FLONASE) 50 MCG/ACT nasal spray Place 2 sprays into both  nostrils daily. 16 g 4  . gabapentin (NEURONTIN) 300 MG capsule Take 1 capsule (300 mg total) by mouth 3 (three) times daily. 270 capsule 2  . levothyroxine (SYNTHROID) 50 MCG tablet Take 1 tablet (50 mcg total) by mouth daily before breakfast. 90 tablet 3  . montelukast (SINGULAIR) 10 MG tablet Take 10 mg by mouth at bedtime.    . naproxen (NAPROSYN) 500 MG tablet TAKE 1 TAB BY MOUTH 2 (TWO) TIMES DAILY WITH A MEAL. AS NEEDED FOR MENSTRUAL CRAMPING. 30 tablet 0  . norgestrel-ethinyl estradiol (LO/OVRAL) 0.3-30 MG-MCG tablet Take 1 tablet by mouth daily. 1 Package 11  . Olopatadine HCl (PATANASE NA) Place into the nose daily. Reported on 05/26/2015    . Olopatadine HCl 0.6 % SOLN U 2 SPRAYS IEN BID  1  . ondansetron (ZOFRAN) 4 MG tablet Take 1 tablet (4 mg total) by mouth every 8 (eight) hours as needed for nausea or vomiting. 30 tablet 1  . spironolactone (ALDACTONE) 25 MG tablet TK 1 T PO BID  2  . traZODone (DESYREL) 100 MG tablet Take 1 tablet (100 mg total) by mouth at bedtime. 90 tablet 2   No current facility-administered medications for this visit.      Musculoskeletal: Strength & Muscle Tone: within normal limits Gait & Station: normal Patient leans: N/A  Psychiatric Specialty Exam: Review of Systems  All other systems reviewed and are negative.   There were no vitals taken for this visit.There is no height or weight on file to calculate BMI.  General Appearance: Casual and Fairly Groomed   Eye Contact:  Good  Speech:  Clear and Coherent  Volume:  Normal  Mood:  Euthymic  Affect:  Appropriate and Congruent  Thought Process:  Goal Directed  Orientation:  Full (Time, Place, and Person)  Thought Content: WDL   Suicidal Thoughts:  No  Homicidal Thoughts:  No  Memory:  Immediate;   Good Recent;   Good Remote;   Good  Judgement:  Good  Insight:  Fair  Psychomotor Activity:  Normal  Concentration:  Concentration: Good and Attention Span: Good  Recall:  Good  Fund of Knowledge: Good  Language: Good  Akathisia:  No  Handed:  Right  AIMS (if indicated): not done  Assets:  Communication Skills Desire for Improvement Physical Health Resilience Social Support Talents/Skills  ADL's:  Intact  Cognition: WNL  Sleep:  Good   Screenings: PHQ2-9     [facility]17/2018 in [facility]/2017 in [facility] Visit from 08/27/2016 in [facility] Clinical Support from 11[facility]/2015 in [facility] Visit from 05/28/2013 in [facility]  PHQ-2 Total Score  1  2  0  0  0  PHQ-9 Total Score  -  9  -  -  -       Assessment and Plan: This patient is a 23 year old female with a history of depression anxiety irritable mood and possible bipolar disorder as well as ADD.  She is doing much better since we increased Depakote.  She will continue Depakote ER 750 mg nightly for mood stabilization.  She will continue Adderall XR 30 mg in the a.m. and regular Adderall 10 mg later in the day for focus, gabapentin 300 mg 3 times daily for anxiety, Wellbutrin XL 300 mg daily for depression, trazodone 100 mg at bedtime for sleep and clonazepam 0.5 mg twice daily as needed  for anxiety.  She will return to see me in 3 months   Levonne Spiller, MD 02/11/2019, 10:16 AM

## 2019-03-18 ENCOUNTER — Ambulatory Visit (HOSPITAL_COMMUNITY): Payer: Medicaid Other | Admitting: Psychiatry

## 2019-04-05 ENCOUNTER — Telehealth: Payer: Self-pay | Admitting: Lactation Services

## 2019-04-05 NOTE — Telephone Encounter (Signed)
Called pt and pt's mother asked if pt could a refill on Diflucan.  I advised the mother that the pt will need to schedule a nurse visit appt or she can come in on Mondays during walk in clinic hours from 4-730.  Pt's mother stated that she would inform her daughter.

## 2019-04-05 NOTE — Telephone Encounter (Signed)
Pt called and LM on nurse voicemail. Pt reports she has a yeast infection and wants Diflucan called in to the [facility] on [address].

## 2019-04-06 ENCOUNTER — Other Ambulatory Visit: Payer: Self-pay

## 2019-04-06 DIAGNOSIS — Z20822 Contact with and (suspected) exposure to covid-19: Secondary | ICD-10-CM

## 2019-04-08 LAB — NOVEL CORONAVIRUS, NAA: SARS-CoV-2, NAA: NOT DETECTED

## 2019-04-21 ENCOUNTER — Other Ambulatory Visit (HOSPITAL_COMMUNITY)
Admission: RE | Admit: 2019-04-21 | Discharge: 2019-04-21 | Disposition: A | Discharge status: Home / Self Care | Payer: Medicaid Other | Source: Ambulatory Visit | Attending: Obstetrics & Gynecology | Admitting: Obstetrics & Gynecology

## 2019-04-21 ENCOUNTER — Other Ambulatory Visit: Payer: Self-pay

## 2019-04-21 ENCOUNTER — Ambulatory Visit (INDEPENDENT_AMBULATORY_CARE_PROVIDER_SITE_OTHER): Payer: Medicaid Other | Admitting: Obstetrics & Gynecology

## 2019-04-21 VITALS — BP 121/67 | HR 89 | Wt 131.7 lb

## 2019-04-21 DIAGNOSIS — Z0000 Encounter for general adult medical examination without abnormal findings: Secondary | ICD-10-CM | POA: Diagnosis not present

## 2019-04-21 DIAGNOSIS — Z01419 Encounter for gynecological examination (general) (routine) without abnormal findings: Secondary | ICD-10-CM | POA: Diagnosis not present

## 2019-04-21 DIAGNOSIS — N921 Excessive and frequent menstruation with irregular cycle: Secondary | ICD-10-CM

## 2019-04-21 DIAGNOSIS — Z23 Encounter for immunization: Secondary | ICD-10-CM | POA: Diagnosis not present

## 2019-04-21 NOTE — Progress Notes (Signed)
Subjective:    Samantha Tucker is a 23 y.o. single G0  who presents for an annual exam. She says that she gets frequent "yeast infections". She doesn't use any treatments and is diagnosed by her mom.  The patient has never been sexually active. GYN screening history: no prior history of gyn screening tests. The patient wears seatbelts: yes. The patient participates in regular exercise: yes. Has the patient ever been transfused or tattooed?: no. The patient reports that there is not domestic violence in her life.   Menstrual History: OB History    Gravida  0   Para  0   Term  0   Preterm  0   AB  0   Living  0     SAB  0   TAB  0   Ectopic  0   Multiple  0   Live Births  0           Menarche age: 64 No LMP recorded. (Menstrual status: Irregular Periods).    The following portions of the patient's history were reviewed and updated as appropriate: allergies, current medications, past family history, past medical history, past social history, past surgical history and problem list.  Review of Systems Pertinent items are noted in HPI.   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 Her periods last about 7 days, uses tampons Periods are monthly, some BTB.   Objective:    BP 121/67   Pulse 89   Wt 131 lb 11.2 oz (59.7 kg)   BMI 24.88 kg/m   General Appearance:    Alert, cooperative, no distress, appears stated age  Head:    Normocephalic, without obvious abnormality, atraumatic  Eyes:    PERRL, conjunctiva/corneas clear, EOM's intact, fundi    benign, both eyes  Ears:    Normal TM's and external ear canals, both ears  Nose:   Nares normal, septum midline, mucosa normal, no drainage    or sinus tenderness  Throat:   Lips, mucosa, and tongue normal; teeth and gums normal  Neck:   Supple, symmetrical, trachea midline, no adenopathy;    thyroid:  no  enlargement/tenderness/nodules; no carotid   bruit or JVD  Back:     Symmetric, no curvature, ROM normal, no CVA tenderness  Lungs:     Clear to auscultation bilaterally, respirations unlabored  Chest Wall:    No tenderness or deformity   Heart:    Regular rate and rhythm, S1 and S2 normal, no murmur, rub   or gallop  Breast Exam:    No tenderness, masses, or nipple abnormality  Abdomen:     Soft, non-tender, bowel sounds active all four quadrants,    no masses, no organomegaly  Genitalia:    Normal female without lesion, discharge or tenderness, normal size and shape, anteverted, mobile, non-tender, normal adnexal exam      Extremities:   Extremities normal, atraumatic, no cyanosis or edema  Pulses:   2+ and symmetric all extremities  Skin:   Skin color, texture, turgor normal, no rashes or lesions  Lymph nodes:   Cervical, supraclavicular, and axillary nodes normal  Neurologic:   CNII-XII intact, normal strength, sensation and reflexes    throughout  .    Assessment:    Healthy female exam.   Break through bleeding   Plan:     Thin prep Pap smear.  Check TSH 

## 2019-04-22 LAB — TSH: TSH: 4.15 u[IU]/mL (ref 0.450–4.500)

## 2019-04-23 LAB — CYTOLOGY - PAP: Diagnosis: NEGATIVE

## 2019-05-17 ENCOUNTER — Ambulatory Visit (INDEPENDENT_AMBULATORY_CARE_PROVIDER_SITE_OTHER): Payer: Medicaid Other | Admitting: Psychiatry

## 2019-05-17 ENCOUNTER — Encounter (HOSPITAL_COMMUNITY): Payer: Self-pay | Admitting: Psychiatry

## 2019-05-17 ENCOUNTER — Other Ambulatory Visit: Payer: Self-pay

## 2019-05-17 DIAGNOSIS — F988 Other specified behavioral and emotional disorders with onset usually occurring in childhood and adolescence: Secondary | ICD-10-CM | POA: Diagnosis not present

## 2019-05-17 DIAGNOSIS — F331 Major depressive disorder, recurrent, moderate: Secondary | ICD-10-CM

## 2019-05-17 MED ORDER — AMPHETAMINE-DEXTROAMPHET ER 30 MG PO CP24: 30.0000 mg | ORAL_CAPSULE | Freq: Every day | ORAL | 0 refills | Status: DC

## 2019-05-17 MED ORDER — AMPHETAMINE-DEXTROAMPHETAMINE 10 MG PO TABS: 10.0000 mg | ORAL_TABLET | Freq: Every day | ORAL | 0 refills | Status: DC

## 2019-05-17 MED ORDER — CLONAZEPAM 0.5 MG PO TABS: 0.5000 mg | ORAL_TABLET | Freq: Two times a day (BID) | ORAL | 2 refills | Status: DC | PRN

## 2019-05-17 MED ORDER — GABAPENTIN 300 MG PO CAPS: 300.0000 mg | ORAL_CAPSULE | Freq: Three times a day (TID) | ORAL | 2 refills | Status: DC

## 2019-05-17 MED ORDER — BUPROPION HCL ER (XL) 300 MG PO TB24: 300.0000 mg | ORAL_TABLET | ORAL | 2 refills | Status: DC

## 2019-05-17 MED ORDER — TRAZODONE HCL 100 MG PO TABS: 100.0000 mg | ORAL_TABLET | Freq: Every day | ORAL | 2 refills | Status: DC

## 2019-05-17 MED ORDER — DIVALPROEX SODIUM ER 250 MG PO TB24: ORAL_TABLET | ORAL | 2 refills | Status: DC

## 2019-05-17 NOTE — Progress Notes (Signed)
Virtual Visit via Telephone Note  I connected with Samantha Tucker on 05/17/19 at  2:00 PM EST by telephone and verified that I am speaking with the correct person using two identifiers.   I discussed the limitations, risks, security and privacy concerns of performing an evaluation and management service by telephone and the availability of in person appointments. I also discussed with the patient that there may be a patient responsible charge related to this service. The patient expressed understanding and agreed to proceed.     I discussed the assessment and treatment plan with the patient. The patient was provided an opportunity to ask questions and all were answered. The patient agreed with the plan and demonstrated an understanding of the instructions.   The patient was advised to call back or seek an in-person evaluation if the symptoms worsen or if the condition fails to improve as anticipated.  I provided 15 minutes of non-face-to-face time during this encounter.   Levonne Spiller, MD  [facility] MD/PA/NP OP Progress Note  05/17/2019 2:17 PM Samantha Tucker  MRN:  [id number]  Chief Complaint:  Chief Complaint    Depression; Anxiety; Follow-up     HPI: This patient is a 24 year old single white female who lives with her family in [place].  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  The patient returns after 3 months for follow-up regarding her depression mood swings anxiety and ADD.  She sounds as if she is doing well.  She states she enjoyed her first semester at [employer] and is planning to go back.  Her work is going well.  She has a new boyfriend whom she first met online and they have met in person.  He lives in [place] and she just returned from a trip to see him.  She is excited about the relationship.  Her mood is good and she denies severe anxiety or mood swings.  She denies any thoughts of self-harm or suicide she is focusing well and sleeping well.  She  feels like her medication regimen is working well for her Visit Diagnosis:    ICD-10-CM   1. Major depressive disorder, recurrent episode, moderate (HCC)  F33.1 buPROPion (WELLBUTRIN XL) 300 MG 24 hr tablet  2. 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 ADHD (attention deficit hyperactivity disorder)   . Allergy   . Anxiety   . Constipation   . Environmental allergies   . Nausea   . Postural hypotension     Past Surgical History:  Procedure Laterality Date  . TYMPANOSTOMY TUBE PLACEMENT Bilateral [id number]    Family Psychiatric History: see below  Family History:  Family History  Problem Relation Age of Onset  . Bipolar disorder Father   . Bipolar disorder Sister   . Bipolar disorder Brother   . Thyroid disease Brother   . Bipolar disorder Paternal Uncle   . Alcohol abuse Paternal Uncle   . Bipolar disorder Maternal Grandmother   . Bipolar disorder Paternal Grandfather   . Alcohol abuse Paternal Grandfather   . Bipolar disorder Paternal Grandmother   . Bipolar disorder Brother   . Bipolar disorder Brother     Social History:  Social History   Socioeconomic History  . Marital status: Single    Spouse name: Not on file  .  Number of children: Not on file  . Years of education: Not on file  . Highest education level: Not on file  Occupational History  . Not on file  Tobacco Use  . Smoking status: Never Smoker  . Smokeless tobacco: Never Used  Substance and Sexual Activity  . Alcohol use: No    Comment: 12-27-15 per pt no   . Drug use: No    Comment: 12-27-15 per pt no   . Sexual activity: Never  Other Topics Concern  . Not on file  Social History Narrative  . Not on file   Social Determinants of Health   Financial Resource Strain:   . Difficulty of Paying Living Expenses: Not on file  Food Insecurity:   .  Worried About Charity fundraiser in the Last Year: Not on file  . Ran Out of Food in the Last Year: Not on file  Transportation Needs:   . Lack of Transportation (Medical): Not on file  . Lack of Transportation (Non-Medical): Not on file  Physical Activity:   . Days of Exercise per Week: Not on file  . Minutes of Exercise per Session: Not on file  Stress:   . Feeling of Stress : Not on file  Social Connections:   . Frequency of Communication with Friends and Family: Not on file  . Frequency of Social Gatherings with Friends and Family: Not on file  . Attends Religious Services: Not on file  . Active Member of Clubs or Organizations: Not on file  . Attends Archivist Meetings: Not on file  . Marital Status: Not on file    Allergies:  Allergies  Allergen Reactions  . Other Hives and Shortness Of Breath    Develops hives with exposure to cold air.   . Claritin [Loratadine]     Fainting spells  . Sulfa Antibiotics   . Latex Hives    Metabolic Disorder Labs: No results found for: HGBA1C, MPG No results found for: PROLACTIN No results found for: CHOL, TRIG, HDL, CHOLHDL, VLDL, LDLCALC Lab Results  Component Value Date   TSH 4.150 04/21/2019   TSH 6.39 (H) 01/22/2019    Therapeutic Level Labs: No results found for: LITHIUM Lab Results  Component Value Date   VALPROATE 74.5 01/26/2019   No components found for:  CBMZ  Current Medications: Current Outpatient Medications  Medication Sig Dispense Refill  . albuterol (PROVENTIL HFA;VENTOLIN HFA) 108 (90 Base) MCG/ACT inhaler Inhale 2 puffs into the lungs every 6 (six) hours as needed for wheezing or shortness of breath. 1 Inhaler 0  . amphetamine-dextroamphetamine (ADDERALL XR) 30 MG 24 hr capsule Take 1 capsule (30 mg total) by mouth daily. 30 capsule 0  . amphetamine-dextroamphetamine (ADDERALL XR) 30 MG 24 hr capsule Take 1 capsule (30 mg total) by mouth daily. 30 capsule 0  . amphetamine-dextroamphetamine  (ADDERALL XR) 30 MG 24 hr capsule Take 1 capsule (30 mg total) by mouth daily. 30 capsule 0  . amphetamine-dextroamphetamine (ADDERALL) 10 MG tablet Take 1 tablet (10 mg total) by mouth daily. 30 tablet 0  . amphetamine-dextroamphetamine (ADDERALL) 10 MG tablet Take 1 tablet (10 mg total) by mouth daily. 30 tablet 0  . amphetamine-dextroamphetamine (ADDERALL) 10 MG tablet Take 1 tablet (10 mg total) by mouth daily. 90 tablet 0  . buPROPion (WELLBUTRIN XL) 300 MG 24 hr tablet Take 1 tablet (300 mg total) by mouth every morning. 90 tablet 2  . clindamycin-benzoyl peroxide (BENZACLIN) gel APPLY ON THE SKIN  DAILY TO FACE AT NIGHT  3  . clonazePAM (KLONOPIN) 0.5 MG tablet Take 1 tablet (0.5 mg total) by mouth 2 (two) times daily as needed for anxiety. 60 tablet 2  . desloratadine (CLARINEX) 5 MG tablet Take 5 mg by mouth daily.    . divalproex (DEPAKOTE ER) 250 MG 24 hr tablet Take three tablets at bedtime 90 tablet 2  . fluticasone (FLONASE) 50 MCG/ACT nasal spray Place 2 sprays into both nostrils daily. 16 g 4  . gabapentin (NEURONTIN) 300 MG capsule Take 1 capsule (300 mg total) by mouth 3 (three) times daily. 270 capsule 2  . levothyroxine (SYNTHROID) 50 MCG tablet Take 1 tablet (50 mcg total) by mouth daily before breakfast. 90 tablet 3  . montelukast (SINGULAIR) 10 MG tablet Take 10 mg by mouth at bedtime.    . naproxen (NAPROSYN) 500 MG tablet TAKE 1 TAB BY MOUTH 2 (TWO) TIMES DAILY WITH A MEAL. AS NEEDED FOR MENSTRUAL CRAMPING. 30 tablet 0  . norgestrel-ethinyl estradiol (LO/OVRAL) 0.3-30 MG-MCG tablet Take 1 tablet by mouth daily. 1 Package 11  . Olopatadine HCl 0.6 % SOLN U 2 SPRAYS IEN BID  1  . ondansetron (ZOFRAN) 4 MG tablet Take 1 tablet (4 mg total) by mouth every 8 (eight) hours as needed for nausea or vomiting. 30 tablet 1  . spironolactone (ALDACTONE) 25 MG tablet TK 1 T PO BID  2  . traZODone (DESYREL) 100 MG tablet Take 1 tablet (100 mg total) by mouth at bedtime. 90 tablet 2   No  current facility-administered medications for this visit.     Musculoskeletal: Strength & Muscle Tone: within normal limits Gait & Station: normal Patient leans: N/A  Psychiatric Specialty Exam: Review of Systems  All other systems reviewed and are negative.   There were no vitals taken for this visit.There is no height or weight on file to calculate BMI.  General Appearance: NA  Eye Contact:  NA  Speech:  Clear and Coherent  Volume:  Normal  Mood:  Euthymic  Affect:  NA  Thought Process:  Goal Directed  Orientation:  Full (Time, Place, and Person)  Thought Content: WDL   Suicidal Thoughts:  No  Homicidal Thoughts:  No  Memory:  Immediate;   Good Recent;   Good Remote;   Good  Judgement:  Good  Insight:  Fair  Psychomotor Activity:  Normal  Concentration:  Concentration: Good and Attention Span: Good  Recall:  Good  Fund of Knowledge: Good  Language: Good  Akathisia:  No  Handed:  Right  AIMS (if indicated): not done  Assets:  Communication Skills Desire for Improvement Physical Health Resilience Social Support Talents/Skills Vocational/Educational  ADL's:  Intact  Cognition: WNL  Sleep:  Good   Screenings: GAD-7     [facility]  Total GAD-7 Score  2    PHQ2-9     Office Visit from 04/21/2019 in [facility] Office Visit from 02/17/2018 in [place] [facility] Office Visit from 08/22/2017 in [facility] Office Visit from 08/27/2016 in [facility] from 11/01/2015 in [facility]  PHQ-2 Total Score  0  1  2  0  0  PHQ-9 Total Score  6  -  9  -  -       Assessment and Plan: This patient is a 24 year old female with a history of depression, anxiety irritable  mood and possibly bipolar disorder as well as ADD.  She is doing well on her current regimen.  She will continue Depakote ER  750 mg nightly for mood stabilization, gabapentin 300 mg 3 times daily for anxiety, Wellbutrin XL 300 mg daily for depression, trazodone 100 mg at bedtime for sleep, clonazepam 0.5 mg twice daily as needed for anxiety and Adderall XR 30 mg in the morning and Adderall 10 mg later in the day for focus.  She will return to see me in 3 months   Levonne Spiller, MD 05/17/2019, 2:17 PM

## 2019-05-18 ENCOUNTER — Ambulatory Visit: Payer: Medicaid Other | Attending: Internal Medicine

## 2019-05-18 DIAGNOSIS — Z20822 Contact with and (suspected) exposure to covid-19: Secondary | ICD-10-CM | POA: Diagnosis not present

## 2019-05-20 LAB — NOVEL CORONAVIRUS, NAA: SARS-CoV-2, NAA: NOT DETECTED

## 2019-06-29 ENCOUNTER — Ambulatory Visit: Payer: Medicaid Other

## 2019-08-16 ENCOUNTER — Encounter (HOSPITAL_COMMUNITY): Payer: Self-pay | Admitting: Psychiatry

## 2019-08-16 ENCOUNTER — Other Ambulatory Visit: Payer: Self-pay

## 2019-08-16 ENCOUNTER — Other Ambulatory Visit: Payer: Self-pay | Admitting: Obstetrics & Gynecology

## 2019-08-16 ENCOUNTER — Ambulatory Visit (INDEPENDENT_AMBULATORY_CARE_PROVIDER_SITE_OTHER): Payer: Medicaid Other | Admitting: Psychiatry

## 2019-08-16 DIAGNOSIS — F331 Major depressive disorder, recurrent, moderate: Secondary | ICD-10-CM | POA: Diagnosis not present

## 2019-08-16 DIAGNOSIS — F988 Other specified behavioral and emotional disorders with onset usually occurring in childhood and adolescence: Secondary | ICD-10-CM | POA: Diagnosis not present

## 2019-08-16 MED ORDER — GABAPENTIN 300 MG PO CAPS: 300.0000 mg | ORAL_CAPSULE | Freq: Three times a day (TID) | ORAL | 2 refills | Status: DC

## 2019-08-16 MED ORDER — BUPROPION HCL ER (XL) 300 MG PO TB24: 300.0000 mg | ORAL_TABLET | ORAL | 2 refills | Status: DC

## 2019-08-16 MED ORDER — AMPHETAMINE-DEXTROAMPHETAMINE 10 MG PO TABS: 10.0000 mg | ORAL_TABLET | Freq: Every day | ORAL | 0 refills | Status: DC

## 2019-08-16 MED ORDER — DIVALPROEX SODIUM ER 250 MG PO TB24: ORAL_TABLET | ORAL | 2 refills | Status: DC

## 2019-08-16 MED ORDER — AMPHETAMINE-DEXTROAMPHET ER 30 MG PO CP24: 30.0000 mg | ORAL_CAPSULE | Freq: Every day | ORAL | 0 refills | Status: DC

## 2019-08-16 MED ORDER — TRAZODONE HCL 100 MG PO TABS: 100.0000 mg | ORAL_TABLET | Freq: Every day | ORAL | 2 refills | Status: DC

## 2019-08-16 MED ORDER — CLONAZEPAM 0.5 MG PO TABS: 0.5000 mg | ORAL_TABLET | Freq: Two times a day (BID) | ORAL | 2 refills | Status: DC | PRN

## 2019-08-16 NOTE — Progress Notes (Signed)
Virtual Visit via Telephone Note  I connected with Samantha Tucker on 08/16/19 at  3:00 PM EDT by telephone and verified that I am speaking with the correct person using two identifiers.   I discussed the limitations, risks, security and privacy concerns of performing an evaluation and management service by telephone and the availability of in person appointments. I also discussed with the patient that there may be a patient responsible charge related to this service. The patient expressed understanding and agreed to proceed    I discussed the assessment and treatment plan with the patient. The patient was provided an opportunity to ask questions and all were answered. The patient agreed with the plan and demonstrated an understanding of the instructions.   The patient was advised to call back or seek an in-person evaluation if the symptoms worsen or if the condition fails to improve as anticipated.  I provided 15 minutes of non-face-to-face time during this encounter.   Diannia Ruder, MD  [facility] MD/PA/NP OP Progress Note  08/16/2019 3:21 PM Samantha Tucker  MRN:  [id number]  Chief Complaint:  Chief Complaint    Depression; Anxiety; ADD; Follow-up     HPI: This patient is a 24 year old single white female who lives with her family in [place].  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  The patient returns after 3 months regarding her depression mood swings anxiety and ADD.  She claims she is doing well except she has a symptom of having ear pain.  She states that the ear pain occurs whenever she hears loud voices such as people talking to loud or loud voices outdoors.  She thinks it is more than just anxiety because her ears actually hurt.  She has an appointment with her primary doctor and perhaps she will have a referral to ENT.  Overall however she denies being depressed or anxious.  She is still dating a boy in [place] and is going out to see him again.  She  states that her mood is good and she denies having severe panic attacks anxiety mood swings or difficulty sleeping.  She denies any thoughts of self-harm or suicide.  She is focusing well with the Adderall XR. Visit Diagnosis:    ICD-10-CM   1. 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 Major depressive disorder, recurrent episode, moderate (HCC)  F33.1 buPROPion (WELLBUTRIN XL) 300 MG 24 hr tablet    Past Psychiatric History: Long-term outpatient treatment  Past Medical History:  Past Medical History:  Diagnosis Date  . ADHD (attention deficit hyperactivity disorder)   . Allergy   . Anxiety   . Constipation   . Environmental allergies   . Nausea   . Postural hypotension     Past Surgical History:  Procedure Laterality Date  . TYMPANOSTOMY TUBE PLACEMENT Bilateral [id number]    Family Psychiatric History: see below  Family History:  Family History  Problem Relation Age of Onset  . Bipolar disorder Father   . Bipolar disorder Sister   . Bipolar disorder Brother   . Thyroid disease Brother   . Bipolar disorder Paternal Uncle   . Alcohol abuse Paternal Uncle   . Bipolar disorder Maternal Grandmother   . Bipolar disorder Paternal Grandfather   . Alcohol abuse Paternal Grandfather   . Bipolar disorder Paternal Grandmother   . Bipolar disorder Brother   . Bipolar disorder Brother     Social  History:  Social History   Socioeconomic History  . Marital status: Single    Spouse name: Not on file  . Number of children: Not on file  . Years of education: Not on file  . Highest education level: Not on file  Occupational History  . Not on file  Tobacco Use  . Smoking status: Never Smoker  . Smokeless tobacco: Never Used  Substance and Sexual Activity  . Alcohol use: No    Comment: 12-27-15 per pt no   . Drug use: No    Comment: 12-27-15 per pt no   . Sexual activity: Never  Other Topics Concern  . Not on file  Social History Narrative  . Not on  file   Social Determinants of Health   Financial Resource Strain:   . Difficulty of Paying Living Expenses:   Food Insecurity:   . Worried About Programme researcher, broadcasting/film/video in the Last Year:   . Barista in the Last Year:   Transportation Needs:   . Freight forwarder (Medical):   Marland Kitchen Lack of Transportation (Non-Medical):   Physical Activity:   . Days of Exercise per Week:   . Minutes of Exercise per Session:   Stress:   . Feeling of Stress :   Social Connections:   . Frequency of Communication with Friends and Family:   . Frequency of Social Gatherings with Friends and Family:   . Attends Religious Services:   . Active Member of Clubs or Organizations:   . Attends Banker Meetings:   Marland Kitchen Marital Status:     Allergies:  Allergies  Allergen Reactions  . Other Hives and Shortness Of Breath    Develops hives with exposure to cold air.   . Claritin [Loratadine]     Fainting spells  . Sulfa Antibiotics   . Latex Hives    Metabolic Disorder Labs: No results found for: HGBA1C, MPG No results found for: PROLACTIN No results found for: CHOL, TRIG, HDL, CHOLHDL, VLDL, LDLCALC Lab Results  Component Value Date   TSH 4.150 04/21/2019   TSH 6.39 (H) 01/22/2019    Therapeutic Level Labs: No results found for: LITHIUM Lab Results  Component Value Date   VALPROATE 74.5 01/26/2019   No components found for:  CBMZ  Current Medications: Current Outpatient Medications  Medication Sig Dispense Refill  . albuterol (PROVENTIL HFA;VENTOLIN HFA) 108 (90 Base) MCG/ACT inhaler Inhale 2 puffs into the lungs every 6 (six) hours as needed for wheezing or shortness of breath. 1 Inhaler 0  . amphetamine-dextroamphetamine (ADDERALL XR) 30 MG 24 hr capsule Take 1 capsule (30 mg total) by mouth daily. 30 capsule 0  . amphetamine-dextroamphetamine (ADDERALL XR) 30 MG 24 hr capsule Take 1 capsule (30 mg total) by mouth daily. 30 capsule 0  . amphetamine-dextroamphetamine (ADDERALL  XR) 30 MG 24 hr capsule Take 1 capsule (30 mg total) by mouth daily. 30 capsule 0  . amphetamine-dextroamphetamine (ADDERALL) 10 MG tablet Take 1 tablet (10 mg total) by mouth daily. 30 tablet 0  . amphetamine-dextroamphetamine (ADDERALL) 10 MG tablet Take 1 tablet (10 mg total) by mouth daily. 30 tablet 0  . amphetamine-dextroamphetamine (ADDERALL) 10 MG tablet Take 1 tablet (10 mg total) by mouth daily. 90 tablet 0  . buPROPion (WELLBUTRIN XL) 300 MG 24 hr tablet Take 1 tablet (300 mg total) by mouth every morning. 90 tablet 2  . clindamycin-benzoyl peroxide (BENZACLIN) gel APPLY ON THE SKIN DAILY TO FACE AT NIGHT  3  . clonazePAM (KLONOPIN) 0.5 MG tablet Take 1 tablet (0.5 mg total) by mouth 2 (two) times daily as needed for anxiety. 60 tablet 2  . desloratadine (CLARINEX) 5 MG tablet Take 5 mg by mouth daily.    . divalproex (DEPAKOTE ER) 250 MG 24 hr tablet Take three tablets at bedtime 90 tablet 2  . fluticasone (FLONASE) 50 MCG/ACT nasal spray Place 2 sprays into both nostrils daily. 16 g 4  . gabapentin (NEURONTIN) 300 MG capsule Take 1 capsule (300 mg total) by mouth 3 (three) times daily. 270 capsule 2  . levothyroxine (SYNTHROID) 50 MCG tablet Take 1 tablet (50 mcg total) by mouth daily before breakfast. 90 tablet 3  . montelukast (SINGULAIR) 10 MG tablet Take 10 mg by mouth at bedtime.    . naproxen (NAPROSYN) 500 MG tablet TAKE 1 TAB BY MOUTH 2 (TWO) TIMES DAILY WITH A MEAL. AS NEEDED FOR MENSTRUAL CRAMPING. 30 tablet 0  . norgestrel-ethinyl estradiol (LO/OVRAL) 0.3-30 MG-MCG tablet Take 1 tablet by mouth daily. 1 Package 11  . Olopatadine HCl 0.6 % SOLN U 2 SPRAYS IEN BID  1  . ondansetron (ZOFRAN) 4 MG tablet Take 1 tablet (4 mg total) by mouth every 8 (eight) hours as needed for nausea or vomiting. 30 tablet 1  . spironolactone (ALDACTONE) 25 MG tablet TK 1 T PO BID  2  . traZODone (DESYREL) 100 MG tablet Take 1 tablet (100 mg total) by mouth at bedtime. 90 tablet 2   No current  facility-administered medications for this visit.     Musculoskeletal: Strength & Muscle Tone: within normal limits Gait & Station: normal Patient leans: N/A  Psychiatric Specialty Exam: Review of Systems  HENT: Positive for ear pain.   All other systems reviewed and are negative.   There were no vitals taken for this visit.There is no height or weight on file to calculate BMI.  General Appearance: NA  Eye Contact:  NA  Speech:  Clear and Coherent  Volume:  Normal  Mood:  Euthymic  Affect:  Appropriate and Congruent  Thought Process:  Goal Directed  Orientation:  Full (Time, Place, and Person)  Thought Content: WDL   Suicidal Thoughts:  No  Homicidal Thoughts:  No  Memory:  Immediate;   Good Recent;   Good Remote;   Good  Judgement:  Fair  Insight:  Fair  Psychomotor Activity:  Normal  Concentration:  Concentration: Good and Attention Span: Good  Recall:  Good  Fund of Knowledge: Good  Language: Good  Akathisia:  No  Handed:  Right  AIMS (if indicated): not done  Assets:  Communication Skills Desire for Improvement Physical Health Resilience Social Support Talents/Skills  ADL's:  Intact  Cognition: WNL  Sleep:  Good   Screenings: GAD-7     Office Visit from 04/21/2019 in [facility]  Total GAD-7 Score  2    PHQ2-9     Office Visit from 04/21/2019 in [facility] Office Visit from 02/17/2018 in [facility] Office Visit from 08/22/2017 in [facility] Office Visit from 08/27/2016 in [facility] Clinical Support from 11/01/2015 in [facility]  PHQ-2 Total Score  0  1  2  0  0  PHQ-9 Total Score  6  --  9  --  --       Assessment and Plan: This patient is a 24 year old female with a history of depression  anxiety , irritable mood and possible bipolar disorder as well as ADD.  She will continue Depakote ER 750 mg nightly  for mood stabilization, gabapentin 300 mg 3 times daily for anxiety, Wellbutrin XL 300 mg daily for depression, trazodone 100 mg at bedtime for sleep, clonazepam 0.5 mg twice daily as needed for anxiety and Adderall XR 30 mg in the morning and Adderall 10 mg later in the day for focus.  She will return to see me in 3 months   Levonne Spiller, MD 08/16/2019, 3:21 PM

## 2019-08-27 ENCOUNTER — Other Ambulatory Visit: Payer: Self-pay

## 2019-08-27 ENCOUNTER — Ambulatory Visit (INDEPENDENT_AMBULATORY_CARE_PROVIDER_SITE_OTHER): Payer: Medicaid Other | Admitting: Family Medicine

## 2019-08-27 DIAGNOSIS — H93233 Hyperacusis, bilateral: Secondary | ICD-10-CM | POA: Diagnosis not present

## 2019-08-27 DIAGNOSIS — H9203 Otalgia, bilateral: Secondary | ICD-10-CM | POA: Diagnosis not present

## 2019-08-27 NOTE — Progress Notes (Signed)
Telephone visit  Subjective: CC: ear pain PCP: Raliegh Ip, DO Samantha Tucker is a 24 y.o. female calls for telephone consult today. Patient provides verbal consent for consult held via phone.  Due to COVID-19 pandemic this visit was conducted virtually. This visit type was conducted due to national recommendations for restrictions regarding the COVID-19 Pandemic (e.g. social distancing, sheltering in place) in an effort to limit this patient's exposure and mitigate transmission in our community. All issues noted in this document were discussed and addressed.  A physical exam was not performed with this format.   Location of patient: home Location of provider: [facility] Others present for call: Mom  1. Ear pain Patient reports that loud noises cause sharp bilateral ear pain that has been getting progressively worse over the last year.  She does become overwhelmed by this, exacerbates anxiety.  Denies dizziness, balance problems, nausea.  No hearing loss.  She does reports some tinnitus.  She does have pain sometimes with phone conversations.  She wears ear plugs to try and help with pain ("calm" ear plugs).  ROS: Per HPI  Allergies  Allergen Reactions  . Other Hives and Shortness Of Breath    Develops hives with exposure to cold air.   . Claritin [Loratadine]     Fainting spells  . Sulfa Antibiotics   . Latex Hives   Past Medical History:  Diagnosis Date  . ADHD (attention deficit hyperactivity disorder)   . Allergy   . Anxiety   . Constipation   . Environmental allergies   . Nausea   . Postural hypotension     Current Outpatient Medications:  .  albuterol (PROVENTIL HFA;VENTOLIN HFA) 108 (90 Base) MCG/ACT inhaler, Inhale 2 puffs into the lungs every 6 (six) hours as needed for wheezing or shortness of breath., Disp: 1 Inhaler, Rfl: 0 .  amphetamine-dextroamphetamine (ADDERALL XR) 30 MG 24 hr capsule, Take 1 capsule (30 mg total) by mouth daily., Disp: 30 capsule, Rfl: 0 .   amphetamine-dextroamphetamine (ADDERALL XR) 30 MG 24 hr capsule, Take 1 capsule (30 mg total) by mouth daily., Disp: 30 capsule, Rfl: 0 .  amphetamine-dextroamphetamine (ADDERALL XR) 30 MG 24 hr capsule, Take 1 capsule (30 mg total) by mouth daily., Disp: 30 capsule, Rfl: 0 .  amphetamine-dextroamphetamine (ADDERALL) 10 MG tablet, Take 1 tablet (10 mg total) by mouth daily., Disp: 30 tablet, Rfl: 0 .  amphetamine-dextroamphetamine (ADDERALL) 10 MG tablet, Take 1 tablet (10 mg total) by mouth daily., Disp: 30 tablet, Rfl: 0 .  amphetamine-dextroamphetamine (ADDERALL) 10 MG tablet, Take 1 tablet (10 mg total) by mouth daily., Disp: 90 tablet, Rfl: 0 .  buPROPion (WELLBUTRIN XL) 300 MG 24 hr tablet, Take 1 tablet (300 mg total) by mouth every morning., Disp: 90 tablet, Rfl: 2 .  clindamycin-benzoyl peroxide (BENZACLIN) gel, APPLY ON THE SKIN DAILY TO FACE AT NIGHT, Disp: , Rfl: 3 .  clonazePAM (KLONOPIN) 0.5 MG tablet, Take 1 tablet (0.5 mg total) by mouth 2 (two) times daily as needed for anxiety., Disp: 60 tablet, Rfl: 2 .  desloratadine (CLARINEX) 5 MG tablet, Take 5 mg by mouth daily., Disp: , Rfl:  .  divalproex (DEPAKOTE ER) 250 MG 24 hr tablet, Take three tablets at bedtime, Disp: 90 tablet, Rfl: 2 .  fluticasone (FLONASE) 50 MCG/ACT nasal spray, Place 2 sprays into both nostrils daily., Disp: 16 g, Rfl: 4 .  gabapentin (NEURONTIN) 300 MG capsule, Take 1 capsule (300 mg total) by mouth 3 (three) times daily.,  Disp: 270 capsule, Rfl: 2 .  levothyroxine (SYNTHROID) 50 MCG tablet, Take 1 tablet (50 mcg total) by mouth daily before breakfast., Disp: 90 tablet, Rfl: 3 .  montelukast (SINGULAIR) 10 MG tablet, Take 10 mg by mouth at bedtime., Disp: , Rfl:  .  naproxen (NAPROSYN) 500 MG tablet, TAKE 1 TAB BY MOUTH 2 (TWO) TIMES DAILY WITH A MEAL. AS NEEDED FOR MENSTRUAL CRAMPING., Disp: 30 tablet, Rfl: 0 .  norgestrel-ethinyl estradiol (LO/OVRAL) 0.3-30 MG-MCG tablet, Take 1 tablet by mouth daily., Disp:  1 Package, Rfl: 11 .  Olopatadine HCl 0.6 % SOLN, U 2 SPRAYS IEN BID, Disp: , Rfl: 1 .  ondansetron (ZOFRAN) 4 MG tablet, Take 1 tablet (4 mg total) by mouth every 8 (eight) hours as needed for nausea or vomiting., Disp: 30 tablet, Rfl: 1 .  spironolactone (ALDACTONE) 25 MG tablet, TK 1 T PO BID, Disp: , Rfl: 2 .  traZODone (DESYREL) 100 MG tablet, Take 1 tablet (100 mg total) by mouth at bedtime., Disp: 90 tablet, Rfl: 2  Assessment/ Plan: 24 y.o. female   1. Ear pain, bilateral I have recommended more protective hearing covers.  We discussed that she can find a good pair of hearing protection in the outdoorsman section of [employer].  This will still allow her to drown out loud noises but be able to hear people close by.  I am going to place referral to ENT per their request.  May benefit from audiology eval as well.  2. Hyperacusis of both ears  Orders Placed This Encounter  Procedures  . Ambulatory referral to ENT    Referral Priority:   Routine    Referral Type:   Consultation    Referral Reason:   Specialty Services Required    Requested Specialty:   Otolaryngology    Number of Visits Requested:   1    Start time: 1:47pm End time: 1:53pm  Total time spent on patient care (including telephone call/ virtual visit): 12 minutes  [place], [place] [phone]

## 2019-09-02 DIAGNOSIS — H93233 Hyperacusis, bilateral: Secondary | ICD-10-CM | POA: Diagnosis not present

## 2019-09-02 DIAGNOSIS — H93293 Other abnormal auditory perceptions, bilateral: Secondary | ICD-10-CM | POA: Diagnosis not present

## 2019-10-26 ENCOUNTER — Telehealth: Payer: Self-pay

## 2019-10-26 MED ORDER — NORGESTREL-ETHINYL ESTRADIOL 0.3-30 MG-MCG PO TABS: 1.0000 | ORAL_TABLET | Freq: Every day | ORAL | 2 refills | Status: DC

## 2019-10-26 NOTE — Telephone Encounter (Signed)
Received a call from patient's mother stating that the patient needs a refill on BCP.  I stated to pt's mother that we have sent a refill on the medication requested however the patient does need to call the office to schedule an annual visit for insurance to cover medication.  I advised pt to please have pt contact the office or she can send a MyChart message to schedule and appt as we do not have permission to provide private information.   Pt's mother verbalized understanding.    Addison Naegeli, RN

## 2019-12-20 ENCOUNTER — Other Ambulatory Visit: Payer: Self-pay

## 2019-12-20 ENCOUNTER — Ambulatory Visit (INDEPENDENT_AMBULATORY_CARE_PROVIDER_SITE_OTHER): Payer: Medicaid Other | Admitting: Obstetrics and Gynecology

## 2019-12-20 ENCOUNTER — Encounter: Payer: Self-pay | Admitting: Obstetrics and Gynecology

## 2019-12-20 VITALS — BP 125/79 | HR 114 | Ht 59.0 in | Wt 128.6 lb

## 2019-12-20 DIAGNOSIS — Z3009 Encounter for other general counseling and advice on contraception: Secondary | ICD-10-CM

## 2019-12-20 DIAGNOSIS — Z01419 Encounter for gynecological examination (general) (routine) without abnormal findings: Secondary | ICD-10-CM

## 2019-12-20 MED ORDER — NORGESTREL-ETHINYL ESTRADIOL 0.3-30 MG-MCG PO TABS: 1.0000 | ORAL_TABLET | Freq: Every day | ORAL | 3 refills | Status: DC

## 2019-12-20 NOTE — Progress Notes (Signed)
   History:  Ms. Samantha Tucker is a 24 y.o. [id number] who presents to clinic today for annual gyn exam. Her mother is present with her; patient does not have any topics that she wishes to speak about alone.   Continues to have irregular periods. Says menstrual period is irregular and flow is also irregular. -just had a 2 week period.  -at heaviest, changing tampon/pad three times a day.  -on lo/ovral. Says when she first started this OCP had improved periods but now finds them bothersome -has tried many other OCP's.  -goal would be to reduce period symptoms or not have a period   Has never been sexually active, not planning on being sexually active in the next year. Interested in men.   The following portions of the patient's history were reviewed and updated as appropriate: allergies, current medications, family history, past medical history, social history, past surgical history and problem list.   Review of Systems:  Review of Systems  Constitutional: Negative for chills and fever.  Cardiovascular: Negative for chest pain.  Gastrointestinal: Negative for abdominal pain, nausea and vomiting.  Genitourinary: Negative for dysuria and urgency.  Neurological: Negative for dizziness and headaches.      Objective:  Physical Exam BP 125/79   Pulse (!) 114   Ht 4\' 11"  (1.499 m)   Wt 128 lb 9.6 oz (58.3 kg)   LMP 12/12/2019 (Exact Date)   BMI 25.97 kg/m  Physical Exam Vitals and nursing note reviewed.  Constitutional:      Appearance: Normal appearance.  HENT:     Head: Normocephalic and atraumatic.  Cardiovascular:     Rate and Rhythm: Normal rate.  Pulmonary:     Effort: Pulmonary effort is normal.  Abdominal:     General: Abdomen is flat.     Palpations: Abdomen is soft.     Tenderness: There is no abdominal tenderness.  Genitourinary:    Comments: deferred Skin:    General: Skin is warm and dry.  Neurological:     Mental Status: She is alert.  Psychiatric:        Mood  and Affect: Mood normal.        Behavior: Behavior normal.       Labs and Imaging No results found for this or any previous visit (from the past 24 hour(s)).  No results found.   Assessment & Plan:  1. Well woman exam with routine gynecological exam - up to date on pap smear and routine screening tests. Will attempt to obtain vaccine report from her pediatrician as she is unsure about Gardasil vaccine.   2. Birth Control counseling -discussed BC options as she is currently unhappy w her menstrual cycles. Has had 02/11/2020 in past that was normal  -patient is considering LARC, provided with information and she will call back prn. In meantime she is interesting in trialing continuous OCP. Discussed use and patient verbalized understanding.   Return prn.   [place], MD 12/20/2019 2:28 PM   02/19/2020, MD OB Family Medicine Fellow, [facility], [facility]

## 2019-12-20 NOTE — Patient Instructions (Addendum)
  Reproductive Health Access Project  https://[email or web address].org/wp-content/[email or web address]/[email or web address]

## 2019-12-27 DIAGNOSIS — L503 Dermatographic urticaria: Secondary | ICD-10-CM | POA: Diagnosis not present

## 2019-12-27 DIAGNOSIS — R05 Cough: Secondary | ICD-10-CM | POA: Diagnosis not present

## 2019-12-27 DIAGNOSIS — J301 Allergic rhinitis due to pollen: Secondary | ICD-10-CM | POA: Diagnosis not present

## 2019-12-27 DIAGNOSIS — J3081 Allergic rhinitis due to animal (cat) (dog) hair and dander: Secondary | ICD-10-CM | POA: Diagnosis not present

## 2020-01-10 ENCOUNTER — Telehealth (HOSPITAL_COMMUNITY): Payer: Self-pay | Admitting: *Deleted

## 2020-01-10 ENCOUNTER — Other Ambulatory Visit (HOSPITAL_COMMUNITY): Payer: Self-pay | Admitting: Psychiatry

## 2020-01-10 MED ORDER — AMPHETAMINE-DEXTROAMPHET ER 30 MG PO CP24: 30.0000 mg | ORAL_CAPSULE | Freq: Every day | ORAL | 0 refills | Status: DC

## 2020-01-10 NOTE — Telephone Encounter (Signed)
Patient mother called requesting refills for patient Adderall.

## 2020-01-10 NOTE — Telephone Encounter (Signed)
Sent, she needs appt 

## 2020-01-12 ENCOUNTER — Telehealth (INDEPENDENT_AMBULATORY_CARE_PROVIDER_SITE_OTHER): Payer: Self-pay | Admitting: Psychiatry

## 2020-01-12 ENCOUNTER — Other Ambulatory Visit: Payer: Self-pay

## 2020-01-12 DIAGNOSIS — Z5329 Procedure and treatment not carried out because of patient's decision for other reasons: Secondary | ICD-10-CM

## 2020-01-12 DIAGNOSIS — Z91199 Patient's noncompliance with other medical treatment and regimen due to unspecified reason: Secondary | ICD-10-CM

## 2020-01-14 ENCOUNTER — Encounter (HOSPITAL_COMMUNITY): Payer: Self-pay | Admitting: Psychiatry

## 2020-01-14 ENCOUNTER — Telehealth (INDEPENDENT_AMBULATORY_CARE_PROVIDER_SITE_OTHER): Payer: Medicaid Other | Admitting: Psychiatry

## 2020-01-14 ENCOUNTER — Other Ambulatory Visit: Payer: Self-pay

## 2020-01-14 DIAGNOSIS — F988 Other specified behavioral and emotional disorders with onset usually occurring in childhood and adolescence: Secondary | ICD-10-CM

## 2020-01-14 DIAGNOSIS — F331 Major depressive disorder, recurrent, moderate: Secondary | ICD-10-CM

## 2020-01-14 MED ORDER — GABAPENTIN 300 MG PO CAPS: 300.0000 mg | ORAL_CAPSULE | Freq: Three times a day (TID) | ORAL | 2 refills | Status: DC

## 2020-01-14 MED ORDER — DIVALPROEX SODIUM ER 250 MG PO TB24
ORAL_TABLET | ORAL | 2 refills | Status: DC
Start: 2020-01-14 — End: 2020-03-29

## 2020-01-14 MED ORDER — AMPHETAMINE-DEXTROAMPHET ER 20 MG PO CP24: 40.0000 mg | ORAL_CAPSULE | ORAL | 0 refills | Status: DC

## 2020-01-14 MED ORDER — AMPHETAMINE-DEXTROAMPHETAMINE 10 MG PO TABS: 10.0000 mg | ORAL_TABLET | Freq: Every day | ORAL | 0 refills | Status: DC

## 2020-01-14 MED ORDER — BUPROPION HCL ER (XL) 300 MG PO TB24: 300.0000 mg | ORAL_TABLET | ORAL | 2 refills | Status: DC

## 2020-01-14 MED ORDER — CLONAZEPAM 0.5 MG PO TABS: 0.5000 mg | ORAL_TABLET | Freq: Two times a day (BID) | ORAL | 2 refills | Status: DC | PRN

## 2020-01-14 MED ORDER — TRAZODONE HCL 100 MG PO TABS
100.0000 mg | ORAL_TABLET | Freq: Every day | ORAL | 2 refills | Status: DC
Start: 2020-01-14 — End: 2020-03-29

## 2020-01-14 NOTE — Progress Notes (Signed)
Virtual Visit via Video Note  I connected with Samantha Tucker on 01/14/20 at 10:00 AM EDT by a video enabled telemedicine application and verified that I am speaking with the correct person using two identifiers.   I discussed the limitations of evaluation and management by telemedicine and the availability of in person appointments. The patient expressed understanding and agreed to proceed.    I discussed the assessment and treatment plan with the patient. The patient was provided an opportunity to ask questions and all were answered. The patient agreed with the plan and demonstrated an understanding of the instructions.   The patient was advised to call back or seek an in-person evaluation if the symptoms worsen or if the condition fails to improve as anticipated.  I provided 15 minutes of non-face-to-face time during this encounter. Location: Provider Home, patient home  Samantha Ruder, MD  [facility] MD/PA/NP OP Progress Note  01/14/2020 10:18 AM Samantha Tucker  MRN:  [id number]  Chief Complaint:  Chief Complaint    Depression; Anxiety; ADD; Follow-up     HPI: This patient is a 24 year old single white female who lives with her family in [place].  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  The patient returns after 5 months regarding her depression mood swings anxiety and ADD.  The patient states that she is doing well.  She is working hard and has been enjoying school.  However she states she is having trouble staying focused to the day on the Adderall XR 30 mg.  She states that her courses are harder this time.  I told her we could try to increase to 40 mg if her insurance will allow it.  She also takes a 10 mg regular Adderall later in the day.  In terms of mood she is doing well and she denies depression anxiety mood swings or suicidal ideation.  She is sleeping well.  Last time she complained of sensitivity to noise.  She has had a totally normal audiology exam  and they suggested cognitive therapy.  We'll try to set this up in our office. Visit Diagnosis:    ICD-10-CM   1. 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 Major depressive disorder, recurrent episode, moderate (HCC)  F33.1 buPROPion (WELLBUTRIN XL) 300 MG 24 hr tablet    Past Psychiatric History: Long-term outpatient treatment  Past Medical History:  Past Medical History:  Diagnosis Date  . ADHD (attention deficit hyperactivity disorder)   . Allergy   . Anxiety   . Constipation   . Environmental allergies   . Nausea   . Postural hypotension     Past Surgical History:  Procedure Laterality Date  . TYMPANOSTOMY TUBE PLACEMENT Bilateral [id number]    Family Psychiatric History: see below  Family History:  Family History  Problem Relation Age of Onset  . Bipolar disorder Father   . Bipolar disorder Sister   . Bipolar disorder Brother   . Thyroid disease Brother   . Bipolar disorder Paternal Uncle   . Alcohol abuse Paternal Uncle   . Bipolar disorder Maternal Grandmother   . Bipolar disorder Paternal Grandfather   . Alcohol abuse Paternal Grandfather   . Bipolar disorder Paternal Grandmother   . Bipolar disorder Brother   . Bipolar disorder Brother     Social History:  Social History   Socioeconomic History  . Marital status: Single    Spouse name: Not on file  .  Number of children: Not on file  . Years of education: Not on file  . Highest education level: Not on file  Occupational History  . Not on file  Tobacco Use  . Smoking status: Never Smoker  . Smokeless tobacco: Never Used  Substance and Sexual Activity  . Alcohol use: No    Comment: 12-27-15 per pt no   . Drug use: No    Comment: 12-27-15 per pt no   . Sexual activity: Never  Other Topics Concern  . Not on file  Social History Narrative  . Not on file   Social Determinants of Health   Financial Resource Strain:   . Difficulty of Paying Living Expenses: Not on file   Food Insecurity: No Food Insecurity  . Worried About Programme researcher, broadcasting/film/video in the Last Year: Never true  . Ran Out of Food in the Last Year: Never true  Transportation Needs: No Transportation Needs  . Lack of Transportation (Medical): No  . Lack of Transportation (Non-Medical): No  Physical Activity:   . Days of Exercise per Week: Not on file  . Minutes of Exercise per Session: Not on file  Stress:   . Feeling of Stress : Not on file  Social Connections:   . Frequency of Communication with Friends and Family: Not on file  . Frequency of Social Gatherings with Friends and Family: Not on file  . Attends Religious Services: Not on file  . Active Member of Clubs or Organizations: Not on file  . Attends Banker Meetings: Not on file  . Marital Status: Not on file    Allergies:  Allergies  Allergen Reactions  . Other Hives and Shortness Of Breath    Develops hives with exposure to cold air.   . Claritin [Loratadine]     Fainting spells  . Sulfa Antibiotics   . Latex Hives    Metabolic Disorder Labs: No results found for: HGBA1C, MPG No results found for: PROLACTIN No results found for: CHOL, TRIG, HDL, CHOLHDL, VLDL, LDLCALC Lab Results  Component Value Date   TSH 4.150 04/21/2019   TSH 6.39 (H) 01/22/2019    Therapeutic Level Labs: No results found for: LITHIUM Lab Results  Component Value Date   VALPROATE 74.5 01/26/2019   No components found for:  CBMZ  Current Medications: Current Outpatient Medications  Medication Sig Dispense Refill  . albuterol (PROVENTIL HFA;VENTOLIN HFA) 108 (90 Base) MCG/ACT inhaler Inhale 2 puffs into the lungs every 6 (six) hours as needed for wheezing or shortness of breath. 1 Inhaler 0  . amphetamine-dextroamphetamine (ADDERALL XR) 20 MG 24 hr capsule Take 2 capsules (40 mg total) by mouth every morning. 60 capsule 0  . amphetamine-dextroamphetamine (ADDERALL XR) 20 MG 24 hr capsule Take 2 capsules (40 mg total) by mouth  every morning. 60 capsule 0  . amphetamine-dextroamphetamine (ADDERALL XR) 20 MG 24 hr capsule Take 2 capsules (40 mg total) by mouth every morning. 60 capsule 0  . amphetamine-dextroamphetamine (ADDERALL) 10 MG tablet Take 1 tablet (10 mg total) by mouth daily. 30 tablet 0  . amphetamine-dextroamphetamine (ADDERALL) 10 MG tablet Take 1 tablet (10 mg total) by mouth daily. 30 tablet 0  . amphetamine-dextroamphetamine (ADDERALL) 10 MG tablet Take 1 tablet (10 mg total) by mouth daily. 30 tablet 0  . buPROPion (WELLBUTRIN XL) 300 MG 24 hr tablet Take 1 tablet (300 mg total) by mouth every morning. 90 tablet 2  . clindamycin-benzoyl peroxide (BENZACLIN) gel APPLY ON THE  SKIN DAILY TO FACE AT NIGHT  3  . clonazePAM (KLONOPIN) 0.5 MG tablet Take 1 tablet (0.5 mg total) by mouth 2 (two) times daily as needed for anxiety. 60 tablet 2  . desloratadine (CLARINEX) 5 MG tablet Take 5 mg by mouth daily.    . divalproex (DEPAKOTE ER) 250 MG 24 hr tablet Take three tablets at bedtime 90 tablet 2  . fluticasone (FLONASE) 50 MCG/ACT nasal spray Place 2 sprays into both nostrils daily. 16 g 4  . gabapentin (NEURONTIN) 300 MG capsule Take 1 capsule (300 mg total) by mouth 3 (three) times daily. 270 capsule 2  . levothyroxine (SYNTHROID) 50 MCG tablet Take 1 tablet (50 mcg total) by mouth daily before breakfast. 90 tablet 3  . montelukast (SINGULAIR) 10 MG tablet Take 10 mg by mouth at bedtime.    . naproxen (NAPROSYN) 500 MG tablet TAKE 1 TAB BY MOUTH 2 (TWO) TIMES DAILY WITH A MEAL. AS NEEDED FOR MENSTRUAL CRAMPING. 30 tablet 0  . norgestrel-ethinyl estradiol (LO/OVRAL) 0.3-30 MG-MCG tablet Take 1 tablet by mouth daily. Take continuously, skip the sugar pill week 28 tablet 3  . Olopatadine HCl 0.6 % SOLN U 2 SPRAYS IEN BID  1  . ondansetron (ZOFRAN) 4 MG tablet Take 1 tablet (4 mg total) by mouth every 8 (eight) hours as needed for nausea or vomiting. 30 tablet 1  . spironolactone (ALDACTONE) 25 MG tablet TK 1 T PO  BID  2  . traZODone (DESYREL) 100 MG tablet Take 1 tablet (100 mg total) by mouth at bedtime. 90 tablet 2   No current facility-administered medications for this visit.     Musculoskeletal: Strength & Muscle Tone: within normal limits Gait & Station: normal Patient leans: N/A  Psychiatric Specialty Exam: Review of Systems  Psychiatric/Behavioral: Positive for decreased concentration.  All other systems reviewed and are negative.   There were no vitals taken for this visit.There is no height or weight on file to calculate BMI.  General Appearance: Casual, Neat and Well Groomed  Eye Contact:  Good  Speech:  Clear and Coherent  Volume:  Normal  Mood:  Euthymic  Affect:  Appropriate and Congruent  Thought Process:  Goal Directed  Orientation:  Full (Time, Place, and Person)  Thought Content: WDL   Suicidal Thoughts:  No  Homicidal Thoughts:  No  Memory:  Immediate;   Good Recent;   Good Remote;   Good  Judgement:  Good  Insight:  Fair  Psychomotor Activity:  Normal  Concentration : Fair, still has difficulty with concentrating during the day  Recall:  Good  Fund of Knowledge: Good  Language: Good  Akathisia:  No  Handed:  Right  AIMS (if indicated): not done  Assets:  Communication Skills Desire for Improvement Physical Health Resilience Social Support Talents/Skills Vocational/Educational  ADL's:  Intact  Cognition: WNL  Sleep:  Good   Screenings: GAD-7     [facility]  Total GAD-7 Score 3 2    PHQ2-9     [facility] Office Visit from [facility]/21[facility]2019 [facility] Office Visit [facility]2017 in [facility]  [facility]/2016 in [facility]  PHQ-2 Total Score 3  0 1 2 0  PHQ-9 Total Score 5 6 -- 9 --       Assessment and Plan: This patient is a 24 year old female with a history of depression anxiety irritable mood possible bipolar disorder as well as ADD.  She is still having some trouble focusing so we will increase Adderall XR to 40 mg in the morning and she will continue Adderall 10 mg later in the day for focus.  She'll continue Depakote ER 750 mg at bedtime for mood stabilization, gabapentin 300 mg 3 times daily for anxiety, Wellbutrin XL 300 mg daily for depression, trazodone 100 mg at bedtime for sleep and clonazepam 0.5 mg twice daily as needed for anxiety.  She'll return to see me in 3 months   Samantha Ruder, MD 01/14/2020, 10:18 AM

## 2020-01-20 ENCOUNTER — Other Ambulatory Visit: Payer: Self-pay

## 2020-01-20 ENCOUNTER — Ambulatory Visit (INDEPENDENT_AMBULATORY_CARE_PROVIDER_SITE_OTHER): Payer: Medicaid Other | Admitting: Clinical

## 2020-01-20 ENCOUNTER — Telehealth (HOSPITAL_COMMUNITY): Payer: Self-pay | Admitting: *Deleted

## 2020-01-20 DIAGNOSIS — F419 Anxiety disorder, unspecified: Secondary | ICD-10-CM

## 2020-01-20 DIAGNOSIS — F988 Other specified behavioral and emotional disorders with onset usually occurring in childhood and adolescence: Secondary | ICD-10-CM | POA: Diagnosis not present

## 2020-01-20 DIAGNOSIS — F331 Major depressive disorder, recurrent, moderate: Secondary | ICD-10-CM
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
## 2020-01-20 NOTE — Telephone Encounter (Signed)
Patient would like to transfer to a female therapist due to feeling more comfortable with a female therapist. Please review and let staff know what to do. Staff will call patient back with outcome. Pt number is [phone]

## 2020-02-07 ENCOUNTER — Telehealth (HOSPITAL_COMMUNITY): Payer: Self-pay | Admitting: Psychiatry

## 2020-02-10 ENCOUNTER — Other Ambulatory Visit: Payer: Self-pay

## 2020-02-10 ENCOUNTER — Ambulatory Visit (HOSPITAL_COMMUNITY): Payer: Medicaid Other | Admitting: Clinical

## 2020-02-10 NOTE — Telephone Encounter (Signed)
Opened in error

## 2020-02-12 ENCOUNTER — Other Ambulatory Visit: Payer: Self-pay | Admitting: Endocrinology

## 2020-02-12 NOTE — Telephone Encounter (Signed)
1.  Please schedule f/u appt 2.  Then please refill x 2 mos, pending that appt.  

## 2020-02-21 ENCOUNTER — Other Ambulatory Visit (HOSPITAL_COMMUNITY): Payer: Self-pay | Admitting: Psychiatry

## 2020-02-21 ENCOUNTER — Telehealth (HOSPITAL_COMMUNITY): Payer: Self-pay | Admitting: *Deleted

## 2020-02-21 MED ORDER — BUPROPION HCL ER (XL) 150 MG PO TB24: ORAL_TABLET | ORAL | 0 refills | Status: DC

## 2020-02-21 NOTE — Telephone Encounter (Signed)
If she is interested, she can try higher dose of bupropion (450 mg daily). Side effects include headache, worsening in anxiety, insomnia, heart pounding especially with use of Adderall. Advise her to contact the office if she notices any of those and/or feeling any elevated energy after trying this dose. Please make sure she does not have any history of seizure if she is interested in this. I would advise she has sooner appointment with Dr. Tenny Craw regardless of she is interested in this medication change.

## 2020-02-21 NOTE — Telephone Encounter (Signed)
Ordered bupropion 150 mg tab to take along with 300 mg (total of 450 mg). I will copy Dr. Tenny Craw here so that she is aware of this.

## 2020-02-21 NOTE — Telephone Encounter (Signed)
Depression is worse, not doing well in her classes and don't know if meds need to be adjusted or what.  [phone]. Or [phone].

## 2020-02-21 NOTE — Telephone Encounter (Signed)
Spoke with patient and she is interested in going up on her medication that was advised by provider. Pt scheduled sooner appt with Dr. Tenny Craw for next week.

## 2020-02-22 ENCOUNTER — Other Ambulatory Visit: Payer: Self-pay

## 2020-02-22 ENCOUNTER — Ambulatory Visit (INDEPENDENT_AMBULATORY_CARE_PROVIDER_SITE_OTHER): Payer: Medicaid Other | Admitting: Psychiatry

## 2020-02-22 ENCOUNTER — Encounter (HOSPITAL_COMMUNITY): Payer: Self-pay | Admitting: Psychiatry

## 2020-02-22 DIAGNOSIS — F331 Major depressive disorder, recurrent, moderate: Secondary | ICD-10-CM | POA: Diagnosis not present

## 2020-02-22 DIAGNOSIS — F419 Anxiety disorder, unspecified: Secondary | ICD-10-CM | POA: Diagnosis not present

## 2020-02-22 DIAGNOSIS — F988 Other specified behavioral and emotional disorders with onset usually occurring in childhood and adolescence: Secondary | ICD-10-CM | POA: Diagnosis not present

## 2020-02-22 NOTE — Telephone Encounter (Signed)
Informed patient with information and she verbalized understanding.  
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
## 2020-03-01 ENCOUNTER — Encounter (HOSPITAL_COMMUNITY): Payer: Self-pay | Admitting: Psychiatry

## 2020-03-01 ENCOUNTER — Telehealth (INDEPENDENT_AMBULATORY_CARE_PROVIDER_SITE_OTHER): Payer: Medicaid Other | Admitting: Psychiatry

## 2020-03-01 ENCOUNTER — Other Ambulatory Visit: Payer: Self-pay

## 2020-03-01 DIAGNOSIS — F331 Major depressive disorder, recurrent, moderate: Secondary | ICD-10-CM

## 2020-03-01 DIAGNOSIS — F988 Other specified behavioral and emotional disorders with onset usually occurring in childhood and adolescence: Secondary | ICD-10-CM

## 2020-03-01 MED ORDER — BUPROPION HCL ER (XL) 300 MG PO TB24: 300.0000 mg | ORAL_TABLET | ORAL | 2 refills | Status: DC

## 2020-03-01 MED ORDER — BUPROPION HCL ER (XL) 150 MG PO TB24
ORAL_TABLET | ORAL | 2 refills | Status: DC
Start: 2020-03-01 — End: 2020-03-29

## 2020-03-01 NOTE — Progress Notes (Signed)
Virtual Visit via Video Note  I connected with Anjani C Kearse on 03/01/20 at  1:40 PM EDT by a video enabled telemedicine application and verified that I am speaking with the correct person using two identifiers.  Location: Patient: home Provider: home   I discussed the limitations of evaluation and management by telemedicine and the availability of in person appointments. The patient expressed understanding and agreed to proceed.    I discussed the assessment and treatment plan with the patient. The patient was provided an opportunity to ask questions and all were answered. The patient agreed with the plan and demonstrated an understanding of the instructions.   The patient was advised to call back or seek an in-person evaluation if the symptoms worsen or if the condition fails to improve as anticipated.  I provided 15 minutes of non-face-to-face time during this encounter.   Diannia Rudereborah Jayveion Stalling, MD  [facility] MD/PA/NP OP Progress Note  03/01/2020 1:57 PM Brittiny Leonia CoronaC Hammett  MRN:  [id number]  Chief Complaint:  Chief Complaint    Depression; Anxiety; ADHD; Follow-up     HPI: This patient is a 24 year old single white female who lives with her family in [place].  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  The patient returns today for follow-up regarding her depression mood swings anxiety and ADHD.  She is seen as a work in.  She was last seen about 6 weeks ago.  Since I last saw the patient states that her depression had gotten worse.  Some of this was due to the stress of the difficult course that she is taking.  She has dropped some of her courses right now and is working with the school to get more accommodations and help.  While I was gone on vacation last week Dr. Vanetta ShawlHisada added an additional 150 mg of Wellbutrin XL and she thinks that this is starting to help her.  She is no longer feeling as depressed and despondent or hopeless.  She denies current suicidal ideation  she is sleeping well and her energy is fairly good. Visit Diagnosis:    ICD-10-CM   1. Major depressive disorder, recurrent episode, moderate (HCC)  F33.1 buPROPion (WELLBUTRIN XL) 300 MG 24 hr tablet  2. 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 ADHD (attention deficit hyperactivity disorder)   . Allergy   . Anxiety   . Constipation   . Environmental allergies   . Nausea   . Postural hypotension     Past Surgical History:  Procedure Laterality Date  . TYMPANOSTOMY TUBE PLACEMENT Bilateral [id number]    Family Psychiatric History: see below  Family History:  Family History  Problem Relation Age of Onset  . Bipolar disorder Father   . Bipolar disorder Sister   . Bipolar disorder Brother   . Thyroid disease Brother   . Bipolar disorder Paternal Uncle   . Alcohol abuse Paternal Uncle   . Bipolar disorder Maternal Grandmother   . Bipolar disorder Paternal Grandfather   . Alcohol abuse Paternal Grandfather   . Bipolar disorder Paternal Grandmother   . Bipolar disorder Brother   . Bipolar disorder Brother     Social History:  Social History   Socioeconomic History  . Marital status: Single    Spouse name: Not on file  . Number of children: Not  on file  . Years of education: Not on file  . Highest education level: Not on file  Occupational History  . Not on file  Tobacco Use  . Smoking status: Never Smoker  . Smokeless tobacco: Never Used  Substance and Sexual Activity  . Alcohol use: No  . Drug use: No  . Sexual activity: Never  Other Topics Concern  . Not on file  Social History Narrative  . Not on file   Social Determinants of Health   Financial Resource Strain:   . Difficulty of Paying Living Expenses: Not on file  Food Insecurity: No Food Insecurity  . Worried About Programme researcher, broadcasting/film/video in the Last Year:  Never true  . Ran Out of Food in the Last Year: Never true  Transportation Needs: No Transportation Needs  . Lack of Transportation (Medical): No  . Lack of Transportation (Non-Medical): No  Physical Activity:   . Days of Exercise per Week: Not on file  . Minutes of Exercise per Session: Not on file  Stress:   . Feeling of Stress : Not on file  Social Connections:   . Frequency of Communication with Friends and Family: Not on file  . Frequency of Social Gatherings with Friends and Family: Not on file  . Attends Religious Services: Not on file  . Active Member of Clubs or Organizations: Not on file  . Attends Banker Meetings: Not on file  . Marital Status: Not on file    Allergies:  Allergies  Allergen Reactions  . Other Hives and Shortness Of Breath    Develops hives with exposure to cold air.   . Claritin [Loratadine]     Fainting spells  . Sulfa Antibiotics   . Latex Hives    Metabolic Disorder Labs: No results found for: HGBA1C, MPG No results found for: PROLACTIN No results found for: CHOL, TRIG, HDL, CHOLHDL, VLDL, LDLCALC Lab Results  Component Value Date   TSH 4.150 04/21/2019   TSH 6.39 (H) 01/22/2019    Therapeutic Level Labs: No results found for: LITHIUM Lab Results  Component Value Date   VALPROATE 74.5 01/26/2019   No components found for:  CBMZ  Current Medications: Current Outpatient Medications  Medication Sig Dispense Refill  . albuterol (PROVENTIL HFA;VENTOLIN HFA) 108 (90 Base) MCG/ACT inhaler Inhale 2 puffs into the lungs every 6 (six) hours as needed for wheezing or shortness of breath. 1 Inhaler 0  . amphetamine-dextroamphetamine (ADDERALL XR) 20 MG 24 hr capsule Take 2 capsules (40 mg total) by mouth every morning. 60 capsule 0  . amphetamine-dextroamphetamine (ADDERALL XR) 20 MG 24 hr capsule Take 2 capsules (40 mg total) by mouth every morning. 60 capsule 0  . amphetamine-dextroamphetamine (ADDERALL XR) 20 MG 24 hr capsule  Take 2 capsules (40 mg total) by mouth every morning. 60 capsule 0  . amphetamine-dextroamphetamine (ADDERALL) 10 MG tablet Take 1 tablet (10 mg total) by mouth daily. 30 tablet 0  . amphetamine-dextroamphetamine (ADDERALL) 10 MG tablet Take 1 tablet (10 mg total) by mouth daily. 30 tablet 0  . amphetamine-dextroamphetamine (ADDERALL) 10 MG tablet Take 1 tablet (10 mg total) by mouth daily. 30 tablet 0  . buPROPion (WELLBUTRIN XL) 150 MG 24 hr tablet 450 mg daily. Take along with 300 mg tab 30 tablet 2  . buPROPion (WELLBUTRIN XL) 300 MG 24 hr tablet Take 1 tablet (300 mg total) by mouth every morning. 90 tablet 2  . clindamycin-benzoyl peroxide (BENZACLIN) gel APPLY ON THE  SKIN DAILY TO FACE AT NIGHT  3  . clonazePAM (KLONOPIN) 0.5 MG tablet Take 1 tablet (0.5 mg total) by mouth 2 (two) times daily as needed for anxiety. 60 tablet 2  . desloratadine (CLARINEX) 5 MG tablet Take 5 mg by mouth daily.    . divalproex (DEPAKOTE ER) 250 MG 24 hr tablet Take three tablets at bedtime 90 tablet 2  . fluticasone (FLONASE) 50 MCG/ACT nasal spray Place 2 sprays into both nostrils daily. 16 g 4  . gabapentin (NEURONTIN) 300 MG capsule Take 1 capsule (300 mg total) by mouth 3 (three) times daily. 270 capsule 2  . levothyroxine (SYNTHROID) 25 MCG tablet TAKE ONE TABLET BY MOUTH DAILY BEFORE BREAKFAST 30 tablet 0  . levothyroxine (SYNTHROID) 50 MCG tablet Take 1 tablet (50 mcg total) by mouth daily before breakfast. 90 tablet 3  . montelukast (SINGULAIR) 10 MG tablet Take 10 mg by mouth at bedtime.    . naproxen (NAPROSYN) 500 MG tablet TAKE 1 TAB BY MOUTH 2 (TWO) TIMES DAILY WITH A MEAL. AS NEEDED FOR MENSTRUAL CRAMPING. 30 tablet 0  . norgestrel-ethinyl estradiol (LO/OVRAL) 0.3-30 MG-MCG tablet Take 1 tablet by mouth daily. Take continuously, skip the sugar pill week 28 tablet 3  . Olopatadine HCl 0.6 % SOLN U 2 SPRAYS IEN BID  1  . ondansetron (ZOFRAN) 4 MG tablet Take 1 tablet (4 mg total) by mouth every 8  (eight) hours as needed for nausea or vomiting. 30 tablet 1  . spironolactone (ALDACTONE) 25 MG tablet TK 1 T PO BID  2  . traZODone (DESYREL) 100 MG tablet Take 1 tablet (100 mg total) by mouth at bedtime. 90 tablet 2   No current facility-administered medications for this visit.     Musculoskeletal: Strength & Muscle Tone: within normal limits Gait & Station: normal Patient leans: N/A  Psychiatric Specialty Exam: Review of Systems  All other systems reviewed and are negative.   There were no vitals taken for this visit.There is no height or weight on file to calculate BMI.  General Appearance: Casual and Fairly Groomed  Eye Contact:  Good  Speech:  Clear and Coherent  Volume:  Normal  Mood:  Euthymic  Affect:  Appropriate and Congruent  Thought Process:  Goal Directed  Orientation:  Full (Time, Place, and Person)  Thought Content: Rumination   Suicidal Thoughts:  No  Homicidal Thoughts:  No  Memory:  Immediate;   Good Recent;   Good Remote;   Good  Judgement:  Good  Insight:  Good  Psychomotor Activity:  Normal  Concentration:  Concentration: Good and Attention Span: Good  Recall:  Good  Fund of Knowledge: Good  Language: Good  Akathisia:  No  Handed:  Right  AIMS (if indicated): not done  Assets:  Communication Skills Desire for Improvement Physical Health Resilience Social Support Talents/Skills Vocational/Educational  ADL's:  Intact  Cognition: WNL  Sleep:  Good   Screenings: GAD-7     Office Visit from 12/20/2019 in [facility] Office Visit from 04/21/2019 in [facility] for [facility]  Total GAD-7 Score 3 2    PHQ2-9     Office Visit from 12/20/2019 in [facility] Office Visit from 04/21/2019 in [facility] for [facility] Office Visit from 02/17/2018 in [facility]2017 in [facility] Office Visit from 08/27/2016 in [place]  [facility]  PHQ-2 Total Score 3 0 1 2 0  PHQ-9 Total Score 5 6 -- 9 --       Assessment and Plan: This patient is a 24 year old female with a history of depression anxiety irritable mood possible bipolar disorder as well as ADD.  She has become more depressed in the last several weeks and Wellbutrin was recently increased.  She will continue Wellbutrin XL 450 mg daily for depression.  She'll also continue Depakote ER 750 mg at bedtime for mood stabilization, gabapentin 300 mg 3 times daily for anxiety, trazodone 100 mg at bedtime for sleep, clonazepam 0.5 mg twice daily as needed for anxiety, Adderall XR 40 mg in the morning and Adderall 10 mg later in the day for focus.  She'll return to see me in 4 weeks   Diannia Ruder, MD 03/01/2020, 1:57 PM

## 2020-03-06 ENCOUNTER — Telehealth (HOSPITAL_COMMUNITY): Payer: Self-pay | Admitting: *Deleted

## 2020-03-06 NOTE — Telephone Encounter (Signed)
lmom to inform patient that provider will not be able to do her appt for tomorrow due to provider not being in [place] office.

## 2020-03-07 ENCOUNTER — Ambulatory Visit (HOSPITAL_COMMUNITY): Payer: Medicaid Other | Admitting: Psychiatry

## 2020-03-12 ENCOUNTER — Other Ambulatory Visit: Payer: Self-pay | Admitting: Endocrinology

## 2020-03-12 NOTE — Telephone Encounter (Signed)
1.  Please schedule f/u appt 2.  Then please refill x 2 mos, pending that appt.  

## 2020-03-21 ENCOUNTER — Other Ambulatory Visit: Payer: Self-pay

## 2020-03-21 ENCOUNTER — Ambulatory Visit (INDEPENDENT_AMBULATORY_CARE_PROVIDER_SITE_OTHER): Payer: Medicaid Other | Admitting: Psychiatry

## 2020-03-21 ENCOUNTER — Telehealth (HOSPITAL_COMMUNITY): Payer: Self-pay | Admitting: Psychiatry

## 2020-03-21 DIAGNOSIS — F988 Other specified behavioral and emotional disorders with onset usually occurring in childhood and adolescence: Secondary | ICD-10-CM | POA: Diagnosis not present

## 2020-03-21 DIAGNOSIS — F331 Major depressive disorder, recurrent, moderate: Secondary | ICD-10-CM | POA: Diagnosis not present

## 2020-03-21 NOTE — Progress Notes (Signed)
Virtual Visit via Video Note  I connected with Samantha Tucker on 03/21/20 at 4:05 PM EST  by a video enabled telemedicine application and verified that I am speaking with the correct person using two identifiers.  Location: Patient: Home Provider: [facility] Out[facility] [place] office    I discussed the limitations of evaluation and management by telemedicine and the availability of in person appointments. The patient expressed understanding and agreed to proceed.  I provided 45 minutes of non-face-to-face time during this encounter.   Adah Salvage, LCSW    THERAPIST PROGRESS NOTE  Session Time: Tuesday 03/21/2020 4:05 PM - 4:50 PM   Participation Level: Active  Behavioral Response: CasualAlertAnxious and Depressed  Type of Therapy: Individual Therapy  Treatment Goals addressed: Patient states wanting to be less broken, be able to resume school, attend family functions, go to movies and other recreational events/develop the ability to recognize, accept and cope with feelings of depression, elevate mood and show evidence of usual energy at, activities, and socialization level  Interventions: CBT and Supportive  Summary: Samantha Tucker is a 24 y.o. female who is referred for services due to depression and anxiety.  She denies any psychiatric hospitalizations.  She reports participated in therapy briefly at age 89 and again at age 77.  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  Patient last was seen via virtual visit for the assessment appointment about 3 weeks ago.  She reports increased stress and symptoms of depression/ anxiety.  She has taken a break from school but has continued to work.  She reports depression seems to worsen around her menstrual cycle.  She expresses frustration as she is not able to participate in life the way she wishes.  As a result of the hypercusis, She avoids  activities in which there may be sudden loud noises.  She  wears earmuffs and headphones but reports this causes her to feel excluded.    Suicidal/Homicidal: Nowithout intent/plan  Therapist Response: Reviewed symptoms, discussed stressors, facilitated expression of thoughts and feelings, validated feelings, developed treatment plan, obtained patient's permission to initial plan as this was a virtual visit, provided psychoeducation on the stress and anxiety response, discussed rationale for and assisted patient practice deep breathing to trigger relaxation response, developed plan with patient to practice deep breathing 5 to 10 minutes 2 times per day  Plan: Return again in 2 weeks.  Diagnosis: Axis I: MDD, ADHD      Adah Salvage, LCSW 03/21/2020

## 2020-03-29 ENCOUNTER — Telehealth (INDEPENDENT_AMBULATORY_CARE_PROVIDER_SITE_OTHER): Payer: Medicaid Other | Admitting: Psychiatry

## 2020-03-29 ENCOUNTER — Encounter (HOSPITAL_COMMUNITY): Payer: Self-pay | Admitting: Psychiatry

## 2020-03-29 ENCOUNTER — Other Ambulatory Visit: Payer: Self-pay

## 2020-03-29 DIAGNOSIS — F988 Other specified behavioral and emotional disorders with onset usually occurring in childhood and adolescence: Secondary | ICD-10-CM | POA: Diagnosis not present

## 2020-03-29 DIAGNOSIS — F331 Major depressive disorder, recurrent, moderate: Secondary | ICD-10-CM | POA: Diagnosis not present

## 2020-03-29 MED ORDER — BUPROPION HCL ER (XL) 150 MG PO TB24
ORAL_TABLET | ORAL | 2 refills | Status: DC
Start: 2020-03-29 — End: 2020-05-29

## 2020-03-29 MED ORDER — DIVALPROEX SODIUM ER 250 MG PO TB24
ORAL_TABLET | ORAL | 2 refills | Status: DC
Start: 2020-03-29 — End: 2020-05-29

## 2020-03-29 MED ORDER — AMPHETAMINE-DEXTROAMPHET ER 20 MG PO CP24
40.0000 mg | ORAL_CAPSULE | ORAL | 0 refills | Status: DC
Start: 2020-03-29 — End: 2020-05-29

## 2020-03-29 MED ORDER — AMPHETAMINE-DEXTROAMPHETAMINE 10 MG PO TABS: 10.0000 mg | ORAL_TABLET | Freq: Every day | ORAL | 0 refills | Status: DC

## 2020-03-29 MED ORDER — GABAPENTIN 300 MG PO CAPS: 300.0000 mg | ORAL_CAPSULE | Freq: Three times a day (TID) | ORAL | 2 refills | Status: DC

## 2020-03-29 MED ORDER — CLONAZEPAM 0.5 MG PO TABS: 0.5000 mg | ORAL_TABLET | Freq: Two times a day (BID) | ORAL | 2 refills | Status: DC | PRN

## 2020-03-29 MED ORDER — BUPROPION HCL ER (XL) 300 MG PO TB24: 300.0000 mg | ORAL_TABLET | ORAL | 2 refills | Status: DC

## 2020-03-29 MED ORDER — TRAZODONE HCL 100 MG PO TABS
100.0000 mg | ORAL_TABLET | Freq: Every day | ORAL | 2 refills | Status: DC
Start: 2020-03-29 — End: 2020-05-29

## 2020-03-29 NOTE — Progress Notes (Signed)
Virtual Visit via Video Note  I connected with Samantha Tucker on 03/29/20 at  1:40 PM EST by a video enabled telemedicine application and verified that I am speaking with the correct person using two identifiers.  Location: Patient: home Provider: home   I discussed the limitations of evaluation and management by telemedicine and the availability of in person appointments. The patient expressed understanding and agreed to proceed.    I discussed the assessment and treatment plan with the patient. The patient was provided an opportunity to ask questions and all were answered. The patient agreed with the plan and demonstrated an understanding of the instructions.   The patient was advised to call back or seek an in-person evaluation if the symptoms worsen or if the condition fails to improve as anticipated.  I provided 15 minutes of non-face-to-face time during this encounter.   Diannia Rudereborah Marriana Hibberd, MD  [facility]/NP OP Progress Note  03/29/2020 1:57 PM Samantha Tucker  MRN:  [id number]  Chief Complaint:  Chief Complaint    Anxiety; Depression; ADD; Follow-up     HPI: This patient is a 24 year old single white female [place].  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  The patient returns for follow-up after 4 weeks.  She had had a difficult time few weeks ago and had gotten increasingly depressed.  Her Wellbutrin had been increased to 450 mg and this seems to have helped quite a bit.  Nevertheless she has had to withdraw from all of her courses at [employer].  She has not sent me any forms yet to explain this due to her medical reason.  I explained to her that if we do not get forms filled out she will have to pay the student bone money back.  She voices understanding.  She states that she is in a "much better place now.  She is feeling good her mood has been stable and she denies serious depression or anxiety and she is sleeping well.  She  is focusing well with the Adderall.  She denies any thoughts of suicide or self-harm.  She thinks that she can handle going back to school in the next semester although I urged her to cut back on the course credit she states that she is not going to be able to due to the requirements of her financial aid package Visit Diagnosis:    ICD-10-CM   1. 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 Major depressive disorder, recurrent episode, moderate (HCC)  F33.1 buPROPion (WELLBUTRIN XL) 300 MG 24 hr tablet    Past Psychiatric History: Long-term outpatient treatment  Past Medical History:  Past Medical History:  Diagnosis Date  . ADHD (attention deficit hyperactivity disorder)   . Allergy   . Anxiety   . Constipation   . Environmental allergies   . Nausea   . Postural hypotension     Past Surgical History:  Procedure Laterality Date  . TYMPANOSTOMY TUBE PLACEMENT Bilateral [id number]    Family Psychiatric History: see below  Family History:  Family History  Problem Relation Age of Onset  . Bipolar disorder Father   . Bipolar disorder Sister   . Bipolar disorder Brother   . Thyroid disease Brother   . Bipolar disorder Paternal Uncle   . Alcohol abuse Paternal Uncle   . Bipolar disorder Maternal Grandmother   . Bipolar disorder Paternal Grandfather   .  Alcohol abuse Paternal Grandfather   . Bipolar disorder Paternal Grandmother   . Bipolar disorder Brother   . Bipolar disorder Brother     Social History:  Social History   Socioeconomic History  . Marital status: Single    Spouse name: Not on file  . Number of children: Not on file  . Years of education: Not on file  . Highest education level: Not on file  Occupational History  . Not on file  Tobacco Use  . Smoking status: Never Smoker  . Smokeless tobacco: Never Used  Substance and Sexual Activity  . Alcohol use: No  . Drug use: No  . Sexual activity: Never  Other Topics Concern  . Not on  file  Social History Narrative  . Not on file   Social Determinants of Health   Financial Resource Strain:   . Difficulty of Paying Living Expenses: Not on file  Food Insecurity: No Food Insecurity  . Worried About Programme researcher, broadcasting/film/video in the Last Year: Never true  . Ran Out of Food in the Last Year: Never true  Transportation Needs: No Transportation Needs  . Lack of Transportation (Medical): No  . Lack of Transportation (Non-Medical): No  Physical Activity:   . Days of Exercise per Week: Not on file  . Minutes of Exercise per Session: Not on file  Stress:   . Feeling of Stress : Not on file  Social Connections:   . Frequency of Communication with Friends and Family: Not on file  . Frequency of Social Gatherings with Friends and Family: Not on file  . Attends Religious Services: Not on file  . Active Member of Clubs or Organizations: Not on file  . Attends Banker Meetings: Not on file  . Marital Status: Not on file    Allergies:  Allergies  Allergen Reactions  . Other Hives and Shortness Of Breath    Develops hives with exposure to cold air.   . Claritin [Loratadine]     Fainting spells  . Sulfa Antibiotics   . Latex Hives    Metabolic Disorder Labs: No results found for: HGBA1C, MPG No results found for: PROLACTIN No results found for: CHOL, TRIG, HDL, CHOLHDL, VLDL, LDLCALC Lab Results  Component Value Date   TSH 4.150 04/21/2019   TSH 6.39 (H) 01/22/2019    Therapeutic Level Labs: No results found for: LITHIUM Lab Results  Component Value Date   VALPROATE 74.5 01/26/2019   No components found for:  CBMZ  Current Medications: Current Outpatient Medications  Medication Sig Dispense Refill  . albuterol (PROVENTIL HFA;VENTOLIN HFA) 108 (90 Base) MCG/ACT inhaler Inhale 2 puffs into the lungs every 6 (six) hours as needed for wheezing or shortness of breath. 1 Inhaler 0  . amphetamine-dextroamphetamine (ADDERALL XR) 20 MG 24 hr capsule Take 2  capsules (40 mg total) by mouth every morning. 60 capsule 0  . amphetamine-dextroamphetamine (ADDERALL XR) 20 MG 24 hr capsule Take 2 capsules (40 mg total) by mouth every morning. 60 capsule 0  . amphetamine-dextroamphetamine (ADDERALL XR) 20 MG 24 hr capsule Take 2 capsules (40 mg total) by mouth every morning. 60 capsule 0  . amphetamine-dextroamphetamine (ADDERALL) 10 MG tablet Take 1 tablet (10 mg total) by mouth daily. 30 tablet 0  . amphetamine-dextroamphetamine (ADDERALL) 10 MG tablet Take 1 tablet (10 mg total) by mouth daily. 30 tablet 0  . amphetamine-dextroamphetamine (ADDERALL) 10 MG tablet Take 1 tablet (10 mg total) by mouth daily. 30 tablet  0  . buPROPion (WELLBUTRIN XL) 150 MG 24 hr tablet 450 mg daily. Take along with 300 mg tab 30 tablet 2  . buPROPion (WELLBUTRIN XL) 300 MG 24 hr tablet Take 1 tablet (300 mg total) by mouth every morning. 90 tablet 2  . clindamycin-benzoyl peroxide (BENZACLIN) gel APPLY ON THE SKIN DAILY TO FACE AT NIGHT  3  . clonazePAM (KLONOPIN) 0.5 MG tablet Take 1 tablet (0.5 mg total) by mouth 2 (two) times daily as needed for anxiety. 60 tablet 2  . desloratadine (CLARINEX) 5 MG tablet Take 5 mg by mouth daily.    . divalproex (DEPAKOTE ER) 250 MG 24 hr tablet Take three tablets at bedtime 90 tablet 2  . fluticasone (FLONASE) 50 MCG/ACT nasal spray Place 2 sprays into both nostrils daily. 16 g 4  . gabapentin (NEURONTIN) 300 MG capsule Take 1 capsule (300 mg total) by mouth 3 (three) times daily. 270 capsule 2  . levothyroxine (SYNTHROID) 25 MCG tablet TAKE ONE TABLET BY MOUTH EVERY MORNING BEFORE BREAKFAST 30 tablet 0  . levothyroxine (SYNTHROID) 50 MCG tablet Take 1 tablet (50 mcg total) by mouth daily before breakfast. 90 tablet 3  . montelukast (SINGULAIR) 10 MG tablet Take 10 mg by mouth at bedtime.    . naproxen (NAPROSYN) 500 MG tablet TAKE 1 TAB BY MOUTH 2 (TWO) TIMES DAILY WITH A MEAL. AS NEEDED FOR MENSTRUAL CRAMPING. 30 tablet 0  .  norgestrel-ethinyl estradiol (LO/OVRAL) 0.3-30 MG-MCG tablet Take 1 tablet by mouth daily. Take continuously, skip the sugar pill week 28 tablet 3  . Olopatadine HCl 0.6 % SOLN U 2 SPRAYS IEN BID  1  . ondansetron (ZOFRAN) 4 MG tablet Take 1 tablet (4 mg total) by mouth every 8 (eight) hours as needed for nausea or vomiting. 30 tablet 1  . spironolactone (ALDACTONE) 25 MG tablet TK 1 T PO BID  2  . traZODone (DESYREL) 100 MG tablet Take 1 tablet (100 mg total) by mouth at bedtime. 90 tablet 2   No current facility-administered medications for this visit.     Musculoskeletal: Strength & Muscle Tone: within normal limits Gait & Station: normal Patient leans: N/A  Psychiatric Specialty Exam: Review of Systems  All other systems reviewed and are negative.   There were no vitals taken for this visit.There is no height or weight on file to calculate BMI.  General Appearance: Casual and Fairly Groomed  Eye Contact:  Good  Speech:  Clear and Coherent  Volume:  Normal  Mood:  Euthymic  Affect:  Appropriate and Congruent  Thought Process:  Goal Directed  Orientation:  Full (Time, Place, and Person)  Thought Content: WDL   Suicidal Thoughts:  No  Homicidal Thoughts:  No  Memory:  Immediate;   Good Recent;   Good Remote;   Good  Judgement:  Good  Insight:  Fair  Psychomotor Activity:  Normal  Concentration:  Concentration: Good and Attention Span: Good  Recall:  Good  Fund of Knowledge: Good  Language: Good  Akathisia:  No  Handed:  Right  AIMS (if indicated): not done  Assets:  Communication Skills Desire for Improvement Physical Health Resilience Social Support Talents/Skills  ADL's:  Intact  Cognition: WNL  Sleep:  Good   Screenings: GAD-7     Office Visit from 12/20/2019 in [facility] Office Visit from 04/21/2019 in Center [facility]  Total GAD-7 Score 3 2  PHQ2-9     Office Visit from  12/20/2019 in [facility] Office Visit from 04/21/2019 in Center for [facility] Office Visit from 02/17/2018 in [facility] Office Visit from 08/22/2017 in [facility] Office Visit from 08/27/2016 in [facility]  PHQ-2 Total Score 3 0 1 2 0  PHQ-9 Total Score 5 6 -- 9 --       Assessment and Plan: Patient is a 24 year old female with a history of depression anger irritability and possible bipolar disorder as well as ADD.  She seems to be doing better on the increased Wellbutrin so Wellbutrin XL 450 mg daily for depression will be continued.  She will also continue Depakote ER 750 mg at bedtime for mood stabilization, gabapentin 300 mg 3 times daily for anxiety, trazodone 100 mg at bedtime for sleep, clonazepam 0.5 mg twice daily as needed for anxiety and Adderall XR 40 mg in the morning and Adderall 10 mg later in the day for focus.  She will return to see me in 2 months   Diannia Ruder, MD 03/29/2020, 1:57 PM

## 2020-04-04 ENCOUNTER — Ambulatory Visit (HOSPITAL_COMMUNITY): Payer: Medicaid Other | Admitting: Clinical

## 2020-04-09 ENCOUNTER — Other Ambulatory Visit: Payer: Self-pay | Admitting: Endocrinology

## 2020-04-09 NOTE — Telephone Encounter (Signed)
1.  Please schedule f/u appt 2.  Then please refill x 2 mos, pending that appt.  

## 2020-04-09 NOTE — Telephone Encounter (Signed)
Last OV 01/2019. Please advise.

## 2020-04-10 NOTE — Telephone Encounter (Signed)
Outbound call to patient requesting a call back to schedule an appointment as soon as possible.

## 2020-04-11 ENCOUNTER — Ambulatory Visit (INDEPENDENT_AMBULATORY_CARE_PROVIDER_SITE_OTHER): Payer: Medicaid Other | Admitting: Psychiatry

## 2020-04-11 ENCOUNTER — Other Ambulatory Visit: Payer: Self-pay

## 2020-04-11 DIAGNOSIS — F988 Other specified behavioral and emotional disorders with onset usually occurring in childhood and adolescence: Secondary | ICD-10-CM

## 2020-04-11 DIAGNOSIS — F331 Major depressive disorder, recurrent, moderate: Secondary | ICD-10-CM | POA: Diagnosis not present
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
## 2020-04-13 ENCOUNTER — Other Ambulatory Visit: Payer: Self-pay | Admitting: Obstetrics and Gynecology

## 2020-04-13 DIAGNOSIS — Z3009 Encounter for other general counseling and advice on contraception: Secondary | ICD-10-CM

## 2020-05-17 ENCOUNTER — Other Ambulatory Visit: Payer: Self-pay

## 2020-05-17 ENCOUNTER — Ambulatory Visit (INDEPENDENT_AMBULATORY_CARE_PROVIDER_SITE_OTHER): Payer: Medicaid Other | Admitting: Psychiatry

## 2020-05-17 DIAGNOSIS — F331 Major depressive disorder, recurrent, moderate: Secondary | ICD-10-CM | POA: Diagnosis not present

## 2020-05-17 NOTE — Progress Notes (Signed)
Virtual Visit via Video Note  I connected with Samantha Tucker on 05/17/20 at 3:07 PM EST  by a video enabled telemedicine application and verified that I am speaking with the correct person using two identifiers.  Location: Patient: Home Provider: [facility] [place] office    I discussed the limitations of evaluation and management by telemedicine and the availability of in person appointments. The patient expressed understanding and agreed to proceed.  I provided 51  minutes of non-face-to-face time during this encounter.   Adah Salvage, LCSW     THERAPIST PROGRESS NOTE  Session Time: Wednesday 05/18/2019 3:07 PM - 3:58 PM   Participation Level: Active  Behavioral Response: CasualAlertAnxious and Depressed  Type of Therapy: Individual Therapy  Treatment Goals addressed: Patient states wanting to be less broken, be able to resume school, attend family functions, go to movies and other recreational events/develop the ability to recognize, accept and cope with feelings of depression, elevate mood and show evidence of usual energy at, activities, and socialization level  Interventions: CBT and Supportive  Summary: Samantha Tucker is a 25 y.o. female who is referred for services due to depression and anxiety.  She denies any psychiatric hospitalizations.  She reports participated in therapy briefly at age 89 and again at age 63.  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  Patient last was seen via virtual visit for the assessment appointment about 3-4 weeks ago.  She reports much improved mood and increased social involvement with family and friends since last session.  Per patient's report, her siblings along with their partners were home for the holidays.  Patient reports being more involved in activities/family events despite noise sensitivity.  She reports increasing her tolerance a little at a time being in these situations.  She  reports using self talk.  She reports being much happier.  She still experiences anxiety and avoidant behaviors regarding certain situations.  She expresses frustration and anger with self regarding having anxiety. Suicidal/Homicidal: Nowithout intent/plan  Therapist Response: Reviewed symptoms, praised and reinforced patient's efforts to increase involvement with family, discussed effects, began to discuss next steps for treatment, discussed rationale for and assisted patient practice leaves on a stream exercise to promote self compassion and nonjudgment regarding having anxiety, developed plan with patient to practice daily, also developed plan with patient to prioritize list of situations she tends to avoid in preparation for next session    plan: Return again in 2 weeks.  Diagnosis: Axis I: MDD, ADHD      Adah Salvage, LCSW 05/17/2020

## 2020-05-29 ENCOUNTER — Encounter (HOSPITAL_COMMUNITY): Payer: Self-pay | Admitting: Psychiatry

## 2020-05-29 ENCOUNTER — Telehealth (INDEPENDENT_AMBULATORY_CARE_PROVIDER_SITE_OTHER): Payer: Medicaid Other | Admitting: Psychiatry

## 2020-05-29 ENCOUNTER — Other Ambulatory Visit: Payer: Self-pay

## 2020-05-29 ENCOUNTER — Telehealth (HOSPITAL_COMMUNITY): Payer: Self-pay | Admitting: Psychiatry

## 2020-05-29 DIAGNOSIS — F988 Other specified behavioral and emotional disorders with onset usually occurring in childhood and adolescence: Secondary | ICD-10-CM | POA: Diagnosis not present

## 2020-05-29 DIAGNOSIS — F331 Major depressive disorder, recurrent, moderate: Secondary | ICD-10-CM

## 2020-05-29 MED ORDER — AMPHETAMINE-DEXTROAMPHETAMINE 10 MG PO TABS: 10.0000 mg | ORAL_TABLET | Freq: Every day | ORAL | 0 refills | Status: DC

## 2020-05-29 MED ORDER — AMPHETAMINE-DEXTROAMPHET ER 20 MG PO CP24: 40.0000 mg | ORAL_CAPSULE | ORAL | 0 refills | Status: DC

## 2020-05-29 MED ORDER — BUPROPION HCL ER (XL) 150 MG PO TB24
ORAL_TABLET | ORAL | 2 refills | Status: DC
Start: 2020-05-29 — End: 2020-08-08

## 2020-05-29 MED ORDER — GABAPENTIN 300 MG PO CAPS: 300.0000 mg | ORAL_CAPSULE | Freq: Three times a day (TID) | ORAL | 2 refills | Status: DC

## 2020-05-29 MED ORDER — CLONAZEPAM 0.5 MG PO TABS: 0.5000 mg | ORAL_TABLET | Freq: Two times a day (BID) | ORAL | 2 refills | Status: DC | PRN

## 2020-05-29 MED ORDER — AMPHETAMINE-DEXTROAMPHET ER 20 MG PO CP24
40.0000 mg | ORAL_CAPSULE | ORAL | 0 refills | Status: DC
Start: 2020-05-29 — End: 2020-08-08

## 2020-05-29 MED ORDER — TRAZODONE HCL 100 MG PO TABS: 100.0000 mg | ORAL_TABLET | Freq: Every day | ORAL | 2 refills | Status: DC

## 2020-05-29 MED ORDER — BUPROPION HCL ER (XL) 300 MG PO TB24: 300.0000 mg | ORAL_TABLET | ORAL | 2 refills | Status: DC

## 2020-05-29 MED ORDER — DIVALPROEX SODIUM ER 250 MG PO TB24
ORAL_TABLET | ORAL | 2 refills | Status: DC
Start: 2020-05-29 — End: 2020-08-08

## 2020-05-29 MED ORDER — AMPHETAMINE-DEXTROAMPHETAMINE 10 MG PO TABS
10.0000 mg | ORAL_TABLET | Freq: Every day | ORAL | 0 refills | Status: DC
Start: 2020-05-29 — End: 2020-09-08

## 2020-05-29 NOTE — Progress Notes (Signed)
Virtual Visit via Video Note  I connected with Samantha Tucker on 05/29/20 at  1:40 PM EST by a video enabled telemedicine application and verified that I am speaking with the correct person using two identifiers.  Location: Patient: home Provider: home   I discussed the limitations of evaluation and management by telemedicine and the availability of in person appointments. The patient expressed understanding and agreed to proceed   I discussed the assessment and treatment plan with the patient. The patient was provided an opportunity to ask questions and all were answered. The patient agreed with the plan and demonstrated an understanding of the instructions.   The patient was advised to call back or seek an in-person evaluation if the symptoms worsen or if the condition fails to improve as anticipated.  I provided 15 minutes of non-face-to-face time during this encounter.   Samantha Rudereborah Javaughn Opdahl, MD  [facility]/NP OP Progress Note  05/29/2020 2:06 PM Samantha Tucker  MRN:  [id number]  Chief Complaint:  Chief Complaint    ADHD; Depression; Anxiety; Follow-up     HPI: This patient is a 25 year old single white female who lives [place] in [place]. 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  The patient returns for follow-up after 2 months.  Last semester at school she had really struggled and had gotten extremely anxious and depressed and had to drop her classes.  I did increase her Wellbutrin and it seems to help along with [facility].  This semester she is again taking a full load but she is handling the classes without difficulty.  She continues to work as well.  She knows to take it time out to do something else like exercise or get away from the screen time.  Her mood is good she denies severe mood swings anxiety suicidal ideation or difficulty sleeping.  Her focus is good with the Adderall XR Visit Diagnosis:     ICD-10-CM   1. 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 Major depressive disorder, recurrent episode, moderate (HCC)  F33.1 buPROPion (WELLBUTRIN XL) 300 MG 24 hr tablet    Past Psychiatric History: Long-term outpatient treatment  Past Medical History:  Past Medical History:  Diagnosis Date  . ADHD (attention deficit hyperactivity disorder)   . Allergy   . Anxiety   . Constipation   . Environmental allergies   . Nausea   . Postural hypotension     Past Surgical History:  Procedure Laterality Date  . TYMPANOSTOMY TUBE PLACEMENT Bilateral [id number]    Family Psychiatric History: see below  Family History:  Family History  Problem Relation Age of Onset  . Bipolar disorder Father   . Bipolar disorder Sister   . Bipolar disorder Brother   . Thyroid disease Brother   . Bipolar disorder Paternal Uncle   . Alcohol abuse Paternal Uncle   . Bipolar disorder Maternal Grandmother   . Bipolar disorder Paternal Grandfather   . Alcohol abuse Paternal Grandfather   . Bipolar disorder Paternal Grandmother   . Bipolar disorder Brother   . Bipolar disorder Brother     Social History:  Social History   Socioeconomic History  . Marital status: Single    Spouse name: Not on file  . Number of children: Not on file  . Years of education: Not on file  . Highest education level: Not on file  Occupational History  .  Not on file  Tobacco Use  . Smoking status: Never Smoker  . Smokeless tobacco: Never Used  Substance and Sexual Activity  . Alcohol use: No  . Drug use: No  . Sexual activity: Never  Other Topics Concern  . Not on file  Social History Narrative  . Not on file   Social Determinants of Health   Financial Resource Strain: Not on file  Food Insecurity: No Food Insecurity  . Worried About Programme researcher, broadcasting/film/video in the Last Year: Never true  . Ran Out of Food in the Last Year: Never true  Transportation Needs: No Transportation Needs  . Lack of  Transportation (Medical): No  . Lack of Transportation (Non-Medical): No  Physical Activity: Not on file  Stress: Not on file  Social Connections: Not on file    Allergies:  Allergies  Allergen Reactions  . Other Hives and Shortness Of Breath    Develops hives with exposure to cold air.   . Claritin [Loratadine]     Fainting spells  . Sulfa Antibiotics   . Latex Hives    Metabolic Disorder Labs: No results found for: HGBA1C, MPG No results found for: PROLACTIN No results found for: CHOL, TRIG, HDL, CHOLHDL, VLDL, LDLCALC Lab Results  Component Value Date   TSH 4.150 04/21/2019   TSH 6.39 (H) 01/22/2019    Therapeutic Level Labs: No results found for: LITHIUM Lab Results  Component Value Date   VALPROATE 74.5 01/26/2019   No components found for:  CBMZ  Current Medications: Current Outpatient Medications  Medication Sig Dispense Refill  . albuterol (PROVENTIL HFA;VENTOLIN HFA) 108 (90 Base) MCG/ACT inhaler Inhale 2 puffs into the lungs every 6 (six) hours as needed for wheezing or shortness of breath. 1 Inhaler 0  . amphetamine-dextroamphetamine (ADDERALL XR) 20 MG 24 hr capsule Take 2 capsules (40 mg total) by mouth every morning. 60 capsule 0  . amphetamine-dextroamphetamine (ADDERALL XR) 20 MG 24 hr capsule Take 2 capsules (40 mg total) by mouth every morning. 60 capsule 0  . amphetamine-dextroamphetamine (ADDERALL XR) 20 MG 24 hr capsule Take 2 capsules (40 mg total) by mouth every morning. 60 capsule 0  . amphetamine-dextroamphetamine (ADDERALL) 10 MG tablet Take 1 tablet (10 mg total) by mouth daily. 30 tablet 0  . amphetamine-dextroamphetamine (ADDERALL) 10 MG tablet Take 1 tablet (10 mg total) by mouth daily. 30 tablet 0  . amphetamine-dextroamphetamine (ADDERALL) 10 MG tablet Take 1 tablet (10 mg total) by mouth daily. 30 tablet 0  . buPROPion (WELLBUTRIN XL) 150 MG 24 hr tablet 450 mg daily. Take along with 300 mg tab 30 tablet 2  . buPROPion (WELLBUTRIN XL)  300 MG 24 hr tablet Take 1 tablet (300 mg total) by mouth every morning. 90 tablet 2  . clindamycin-benzoyl peroxide (BENZACLIN) gel APPLY ON THE SKIN DAILY TO FACE AT NIGHT  3  . clonazePAM (KLONOPIN) 0.5 MG tablet Take 1 tablet (0.5 mg total) by mouth 2 (two) times daily as needed for anxiety. 60 tablet 2  . desloratadine (CLARINEX) 5 MG tablet Take 5 mg by mouth daily.    . divalproex (DEPAKOTE ER) 250 MG 24 hr tablet Take three tablets at bedtime 90 tablet 2  . fluticasone (FLONASE) 50 MCG/ACT nasal spray Place 2 sprays into both nostrils daily. 16 g 4  . gabapentin (NEURONTIN) 300 MG capsule Take 1 capsule (300 mg total) by mouth 3 (three) times daily. 270 capsule 2  . levothyroxine (SYNTHROID) 25 MCG tablet TAKE  ONE TABLET BY MOUTH EVERY MORNING BEFORE BREAKFAST 30 tablet 0  . levothyroxine (SYNTHROID) 50 MCG tablet Take 1 tablet (50 mcg total) by mouth daily before breakfast. 90 tablet 3  . LOW-OGESTREL 0.3-30 MG-MCG tablet TAKE ONE TABLET BY MOUTH DAILY CONTINUOUSLY SKIP SUGAR PILL WEEK 28 tablet 3  . montelukast (SINGULAIR) 10 MG tablet Take 10 mg by mouth at bedtime.    . naproxen (NAPROSYN) 500 MG tablet TAKE 1 TAB BY MOUTH 2 (TWO) TIMES DAILY WITH A MEAL. AS NEEDED FOR MENSTRUAL CRAMPING. 30 tablet 0  . Olopatadine HCl 0.6 % SOLN U 2 SPRAYS IEN BID  1  . ondansetron (ZOFRAN) 4 MG tablet Take 1 tablet (4 mg total) by mouth every 8 (eight) hours as needed for nausea or vomiting. 30 tablet 1  . spironolactone (ALDACTONE) 25 MG tablet TK 1 T PO BID  2  . traZODone (DESYREL) 100 MG tablet Take 1 tablet (100 mg total) by mouth at bedtime. 90 tablet 2   No current facility-administered medications for this visit.     Musculoskeletal: Strength & Muscle Tone: within normal limits Gait & Station: normal Patient leans: N/A  Psychiatric Specialty Exam: Review of Systems  All other systems reviewed and are negative.   There were no vitals taken for this visit.There is no height or weight  on file to calculate BMI.  General Appearance: Casual and Fairly Groomed  Eye Contact:  Good  Speech:  Clear and Coherent  Volume:  Normal  Mood:  Euthymic  Affect:  Appropriate and Congruent  Thought Process:  Goal Directed  Orientation:  Full (Time, Place, and Person)  Thought Content: WDL   Suicidal Thoughts:  No  Homicidal Thoughts:  No  Memory:  Immediate;   Good Recent;   Good Remote;   Good  Judgement:  Good  Insight:  Good  Psychomotor Activity:  Normal  Concentration:  Concentration: Good and Attention Span: Good  Recall:  Good  Fund of Knowledge: Good  Language: Good  Akathisia:  No  Handed:  Right  AIMS (if indicated): not done  Assets:  Communication Skills Desire for Improvement Physical Health Resilience Social Support Talents/Skills  ADL's:  Intact  Cognition: WNL  Sleep:  Good   Screenings: GAD-7   Flowsheet Row Office Visit from 12/20/2019 in [facility] Office Visit from 04/21/2019 in [facility]  Total GAD-7 Score 3 2    PHQ2-9   Flowsheet Row [facility]/20/[facility] Office Visit from 04/21/2019 in [facility] Office Visit from 02/17/2018 in [facility] Office Visit from 08/22/2017 in [facility] Office Visit from 08/27/2016 in [facility]  PHQ-2 Total Score 3 0 1 2 0  PHQ-9 Total Score 5 6 -- 9 --       Assessment and Plan: This patient is a 25 year old female with a history of depression anger irritability possible bipolar disorder as well as ADD.  She continues to do well on her current regimen.  She will continue Wellbutrin XL 450 mg daily for depression, Depakote ER 750 mg nightly for mood stabilization, gabapentin 300 mg 3 times daily for anxiety, trazodone 100 mg at bedtime for sleep, clonazepam 0.5 mg  twice daily as needed for anxiety and Adderall XR 40 mg in the morning and Adderall 10 mg later in the day  for focus.  She will return to see me in 3 months   Samantha Ruder, MD 05/29/2020, 2:06 PM

## 2020-05-29 NOTE — Telephone Encounter (Signed)
Called to schedule f/u appt, left vm 

## 2020-05-31 ENCOUNTER — Ambulatory Visit (INDEPENDENT_AMBULATORY_CARE_PROVIDER_SITE_OTHER): Payer: Medicaid Other | Admitting: Psychiatry

## 2020-05-31 ENCOUNTER — Other Ambulatory Visit: Payer: Self-pay

## 2020-05-31 DIAGNOSIS — F988 Other specified behavioral and emotional disorders with onset usually occurring in childhood and adolescence: Secondary | ICD-10-CM | POA: Diagnosis not present

## 2020-05-31 DIAGNOSIS — F331 Major depressive disorder, recurrent, moderate: Secondary | ICD-10-CM | POA: Diagnosis not present

## 2020-05-31 NOTE — Progress Notes (Signed)
Virtual Visit via Video Note  I connected with Samantha Tucker on 05/31/20 at  3:00 PM EST by a video enabled telemedicine application and verified that I am speaking with the correct person using two identifiers.  Location: Patient: Home Provider: [facility] [place] office    I discussed the limitations of evaluation and management by telemedicine and the availability of in person appointments. The patient expressed understanding and agreed to proceed.   I provided 50 minutes of non-face-to-face time during this encounter.   Adah Salvage, LCSW     THERAPIST PROGRESS NOTE  Session Time: Wednesday 05/31/2020 3:00 PM - 3:50 PM   Participation Level: Active  Behavioral Response: CasualAlertAnxious and Depressed  Type of Therapy: Individual Therapy  Treatment Goals addressed: Patient states wanting to be less broken, be able to resume school, attend family functions, go to movies and other recreational events/develop the ability to recognize, accept and cope with feelings of depression, elevate mood and show evidence of usual energy at, activities, and socialization level  Interventions: CBT and Supportive  Summary: Samantha Tucker is a 25 y.o. female who is referred for services due to depression and anxiety.  She denies any psychiatric hospitalizations.  She reports participated in therapy briefly at age 32 and again at age 57.  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  Patient last was seen via virtual visit for the assessment appointment about 2-3 weeks ago.  She states things have been good and reports decreased symptoms of depression.  Per report she becomes mainly depressed at night when she over thinks.  She reports normally responding by going to sleep and waking up in a better mood the next morning.  She is pleased with her continued efforts to tolerate situations she normally would avoid due to loud noises.  She also is  excited about the relationship with her boyfriend and anticipates he will be proposing in the near future.  She continues to experience anxiety about memory issues.  She  expresses frustration about conversing with others due to her fear she may forget the right word to use.  She makes negative statements about self and compares self to siblings as well as others.  She reports practicing leaves on a stream exercise but said it was not useful.  She says she does not see herself as broken but expresses frustration as she states she is not like normal people.  Suicidal/Homicidal: Nowithout intent/plan  Therapist Response: Reviewed symptoms, praised and reinforced patient's efforts to increase involvement with family, assisted patient identified that the connection between her thoughts/mood/behavior, use cognitive defusion to assist patient cope with negative ruminating thoughts about self, provided psychoeducation around connection between memory difficulty and depression/ADHD,  Plan: Return again in 2 weeks.  Diagnosis: Axis I: MDD, ADHD      Adah Salvage, LCSW 05/31/2020

## 2020-06-01 ENCOUNTER — Encounter: Payer: Self-pay | Admitting: Family Medicine

## 2020-06-01 IMAGING — US TRANSVAGINAL ULTRASOUND OF PELVIS
1 series · 15 of 25 positions shown · non-contrast
Comparison: None.

CLINICAL DATA: Dysfunctional uterine bleeding

EXAM:
ULTRASOUND PELVIS TRANSVAGINAL
TECHNIQUE: Transvaginal ultrasound examination of the pelvis was performed
including evaluation of the uterus, ovaries, adnexal regions, and
pelvic cul-de-sac.

[Series 1: transvaginal ultrasound of pelvis · 15 of 48 slices shown]
[im 1/48]
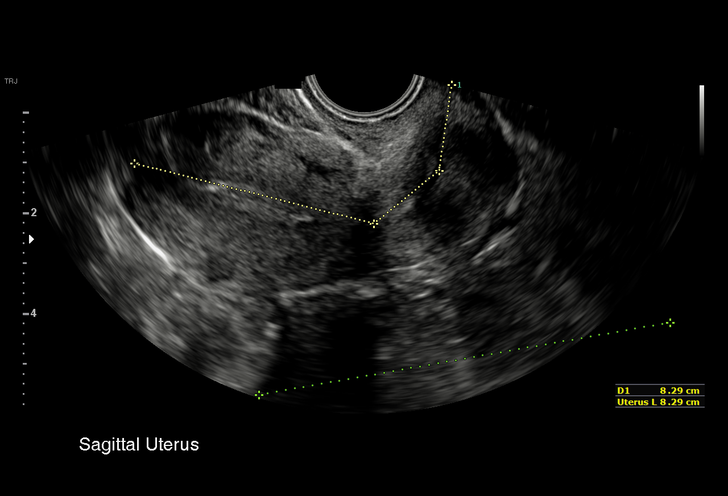
[im 4/48]
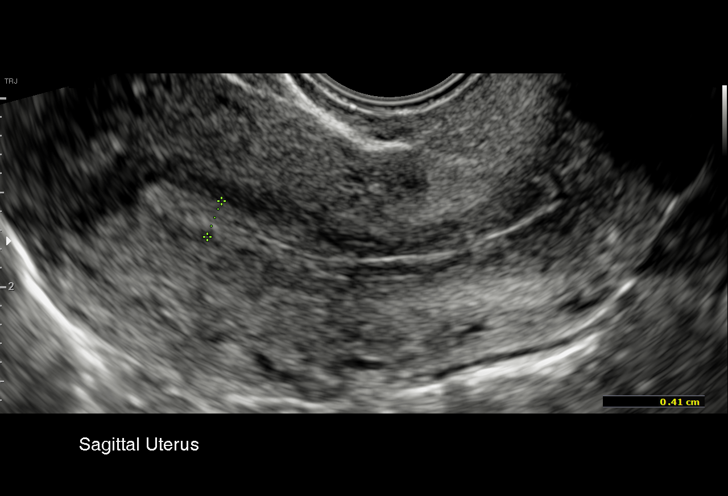
[im 8/48]
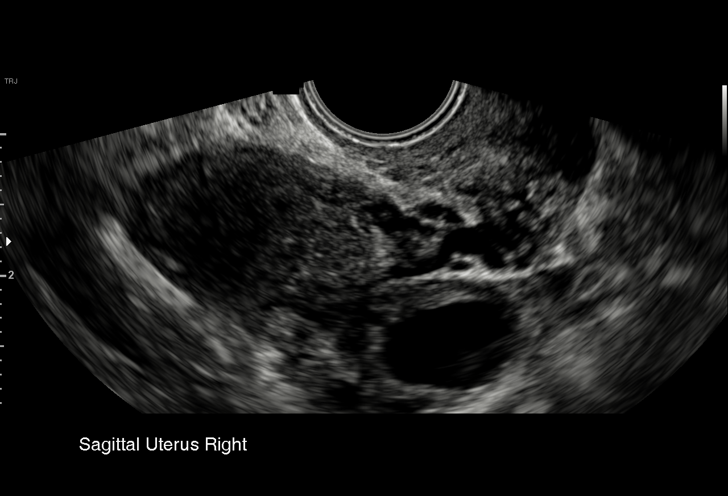
[im 10/48]
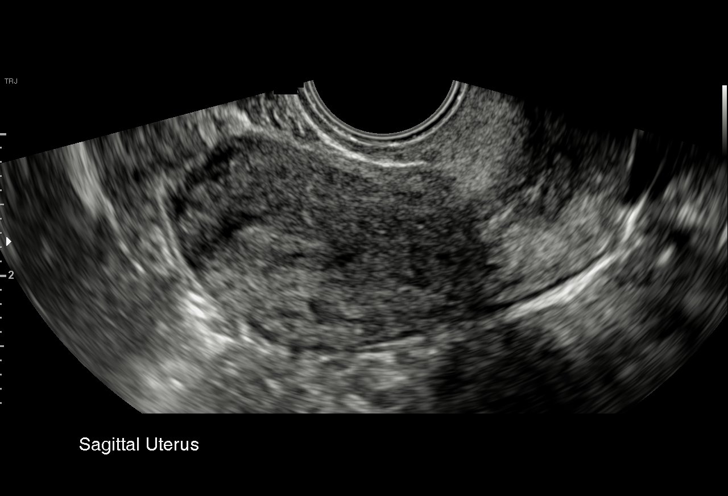
[im 14/48]
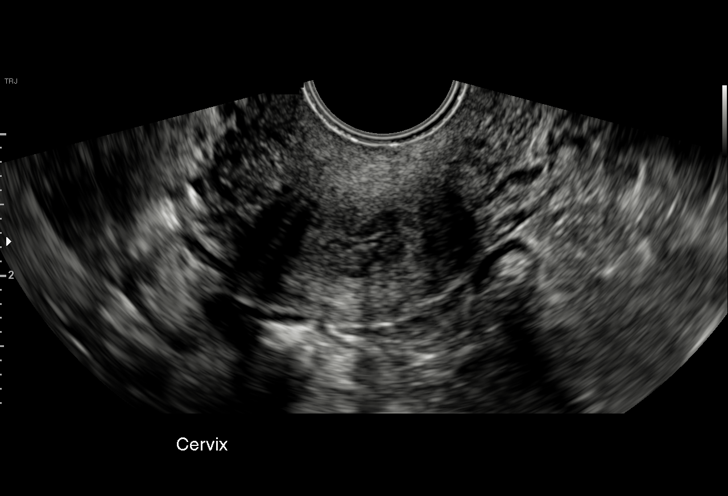
[im 18/48]
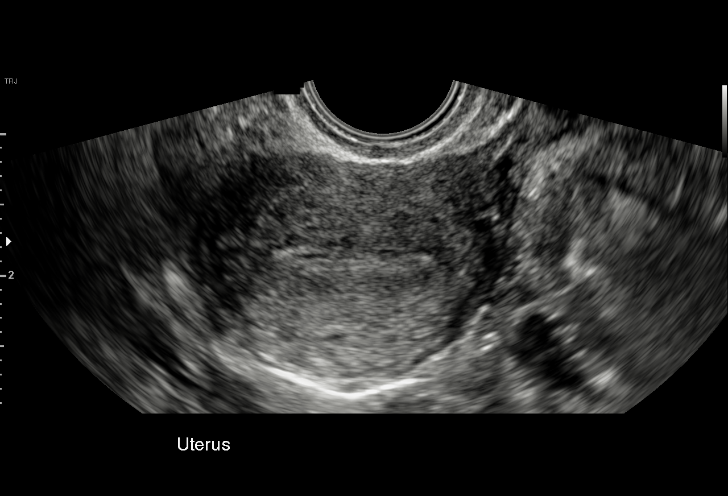
[im 20/48]
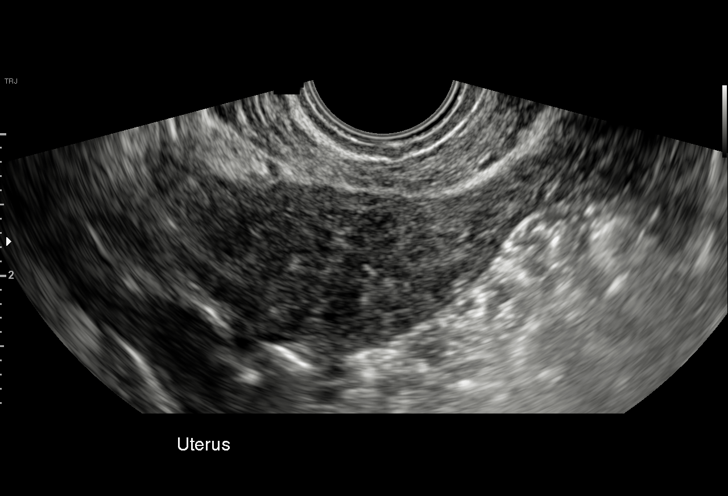
[im 24/48]
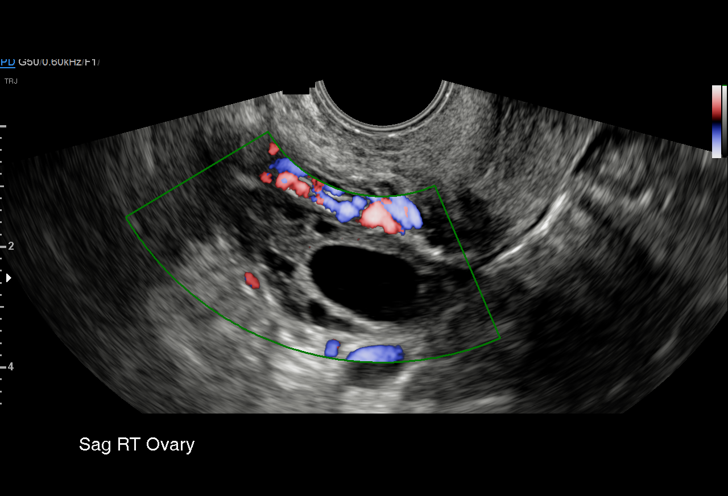
[im 28/48]
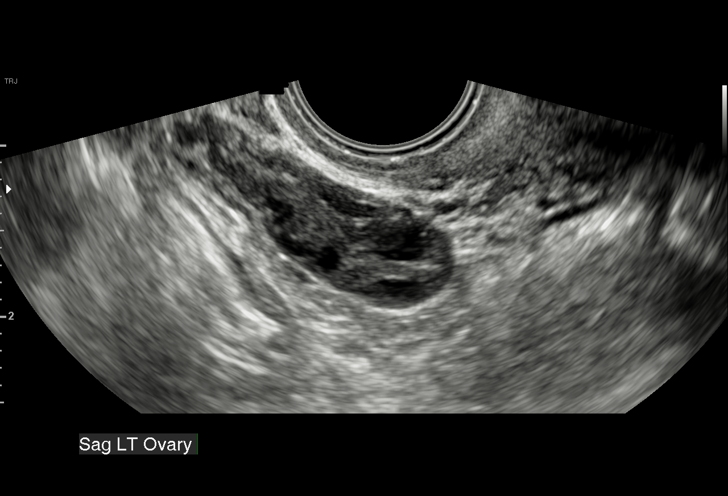
[im 30/48]
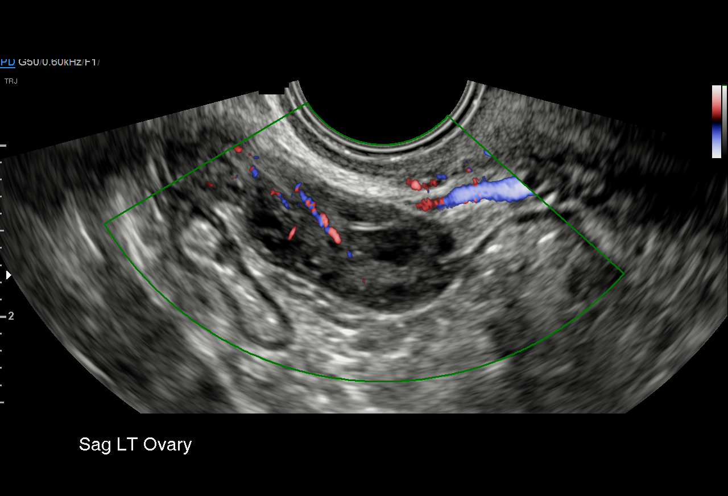
[im 34/48]
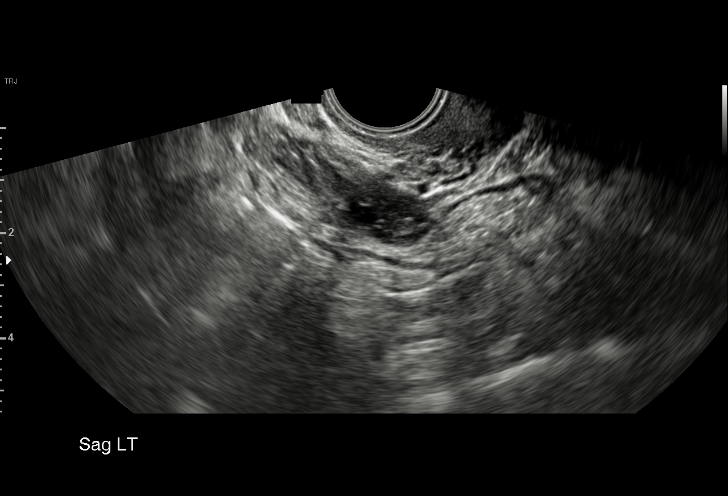
[im 38/48]
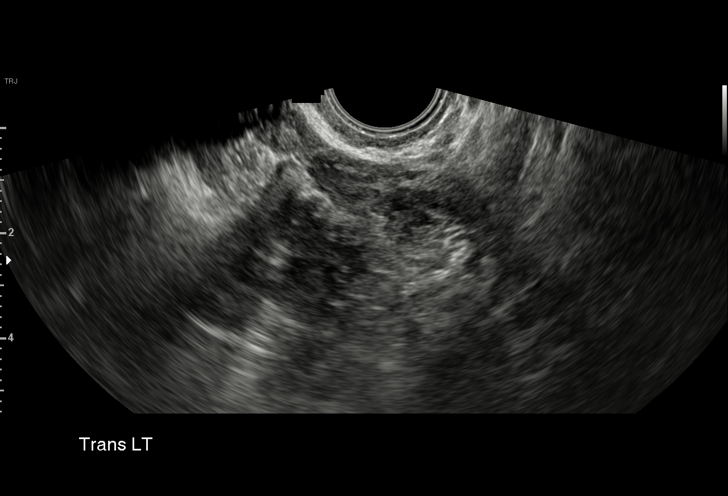
[im 40/48]
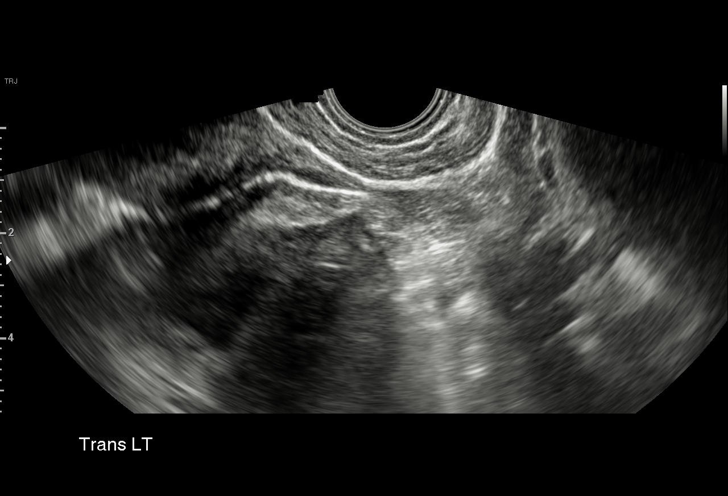
[im 44/48]
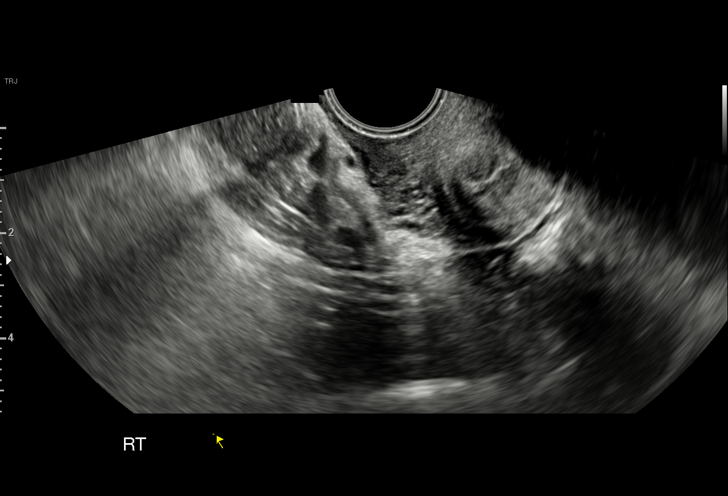
[im 48/48]
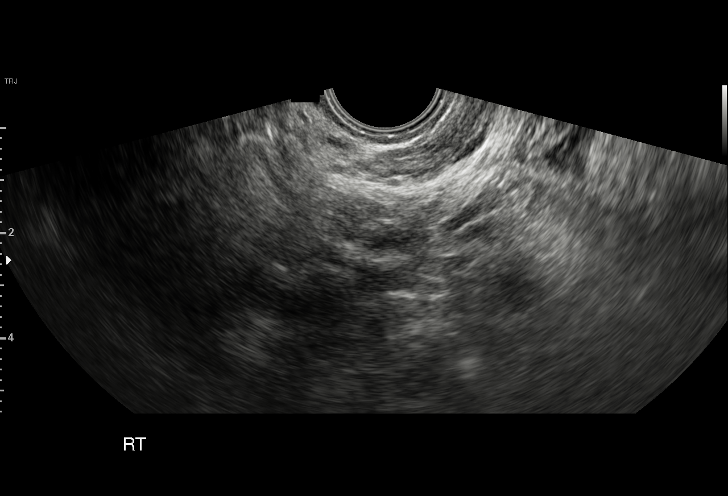

[15 of 25 positions shown; findings below may reference images not displayed]

FINDINGS: Uterus

Measurements: 8.3 x 3.4 x 4.3 cm = volume: 63.4 mL. No fibroids or
other mass visualized.

Endometrium

Thickness: 4.1 mm.  Normal trilaminar appearance

Right ovary

Measurements: 4.1 x 2.1 x 2.0 cm = volume: 9.1 mL. Corpus luteum
noted.

Left ovary

Measurements: 2.5 x 1.2 x 1.6 cm = volume: 2.6 mL. Normal
appearance/no adnexal mass.

Other findings:  No abnormal free fluid
IMPRESSION: 1. Normal pelvic sonogram.

## 2020-06-14 ENCOUNTER — Ambulatory Visit (INDEPENDENT_AMBULATORY_CARE_PROVIDER_SITE_OTHER): Payer: Medicaid Other | Admitting: Psychiatry

## 2020-06-14 ENCOUNTER — Telehealth (HOSPITAL_COMMUNITY): Payer: Self-pay | Admitting: Psychiatry

## 2020-06-14 ENCOUNTER — Other Ambulatory Visit: Payer: Self-pay

## 2020-06-14 DIAGNOSIS — F331 Major depressive disorder, recurrent, moderate: Secondary | ICD-10-CM | POA: Diagnosis not present

## 2020-06-14 DIAGNOSIS — F988 Other specified behavioral and emotional disorders with onset usually occurring in childhood and adolescence: Secondary | ICD-10-CM

## 2020-06-14 NOTE — Telephone Encounter (Signed)
Therapist attempted to contact patient twice via text through caregility platform for scheduled appointment, no response.  Therapist called patient, left message indicating attempt, and requesting patient call office. 

## 2020-06-14 NOTE — Progress Notes (Signed)
Virtual Visit via Video Note  I connected with Samantha Tucker on 06/14/20 at 3:20 PM EST  by a video enabled telemedicine application and verified that I am speaking with the correct person using two identifiers.  Location: Patient: Home Provider: [facility] [place] office    I discussed the limitations of evaluation and management by telemedicine and the availability of in person appointments. The patient expressed understanding and agreed to proceed.  I provided 29 minutes of non-face-to-face time during this encounter.   Adah Salvage, LCSW    THERAPIST PROGRESS NOTE  Session Time: Wednesday 06/14/2020 3:20 PM - 3:49 PM   Participation Level: Active  Behavioral Response: CasualAlertAnxious and Depressed  Type of Therapy: Individual Therapy  Treatment Goals addressed: Patient states wanting to be less broken, be able to resume school, attend family functions, go to movies and other recreational events/develop the ability to recognize, accept and cope with feelings of depression, elevate mood and show evidence of usual energy at, activities, and socialization level  Interventions: CBT and Supportive  Summary: Samantha Tucker is a 25 y.o. female who is referred for services due to depression and anxiety.  She denies any psychiatric hospitalizations.  She reports participated in therapy briefly at age 25 and again at age 61.  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  Patient last was seen via virtual visit  about 2-3 weeks ago.  She states things have been pretty good.  She has resumed school and continues to work.  She expresses some anxiety as she is trying to balance school and work with being able to participate in pleasurable activities.  She has done some problem solving and has decided to postpone making wedding plans until after this school semester.  She reports this has reduced stress.  She reports she still experiences some  depression at night as she has ruminating thoughts about the day.  She is learning to identify/challenge/and replace negative thoughts and says this has been helpful.   Suicidal/Homicidal: Nowithout intent/plan  Therapist Response: Reviewed symptoms, praised and reinforced patient's efforts to resume school/efforts to find balance/use of problem solving skills, discussed ways to improve time management with the use of scheduling, praised and reinforced patient's efforts to replace unhelpful thoughts with more helpful thoughts, discussed ways to cope with ruminating thoughts in the evening, developed plan with patient to use journaling on her phone daily   Plan: Return again in 2 weeks.  Diagnosis: Axis I: MDD, ADHD      Adah Salvage, LCSW 06/14/2020

## 2020-07-07 ENCOUNTER — Other Ambulatory Visit: Payer: Self-pay | Admitting: Endocrinology

## 2020-07-14 ENCOUNTER — Encounter: Payer: Self-pay | Admitting: Family Medicine

## 2020-07-26 ENCOUNTER — Other Ambulatory Visit: Payer: Self-pay | Admitting: Endocrinology

## 2020-07-30 ENCOUNTER — Encounter: Payer: Self-pay | Admitting: Family Medicine

## 2020-07-31 ENCOUNTER — Other Ambulatory Visit: Payer: Self-pay

## 2020-07-31 DIAGNOSIS — Z3009 Encounter for other general counseling and advice on contraception: Secondary | ICD-10-CM

## 2020-07-31 MED ORDER — LOW-OGESTREL 0.3-30 MG-MCG PO TABS: ORAL_TABLET | ORAL | 3 refills | Status: DC

## 2020-07-31 NOTE — Telephone Encounter (Signed)
[facility] sent over RF request for patient's OCP. Pt is up to date on yearly exam. Due 12/2020. Refill sent to patient's pharmacy.

## 2020-08-08 ENCOUNTER — Other Ambulatory Visit: Payer: Self-pay

## 2020-08-08 ENCOUNTER — Encounter (HOSPITAL_COMMUNITY): Payer: Self-pay | Admitting: Psychiatry

## 2020-08-08 ENCOUNTER — Telehealth (INDEPENDENT_AMBULATORY_CARE_PROVIDER_SITE_OTHER): Payer: Medicaid Other | Admitting: Psychiatry

## 2020-08-08 ENCOUNTER — Ambulatory Visit (HOSPITAL_COMMUNITY): Payer: Medicaid Other | Admitting: Psychiatry

## 2020-08-08 ENCOUNTER — Other Ambulatory Visit (HOSPITAL_COMMUNITY): Payer: Self-pay | Admitting: Psychiatry

## 2020-08-08 DIAGNOSIS — F988 Other specified behavioral and emotional disorders with onset usually occurring in childhood and adolescence: Secondary | ICD-10-CM

## 2020-08-08 DIAGNOSIS — F3162 Bipolar disorder, current episode mixed, moderate: Secondary | ICD-10-CM

## 2020-08-08 DIAGNOSIS — F331 Major depressive disorder, recurrent, moderate: Secondary | ICD-10-CM | POA: Diagnosis not present

## 2020-08-08 MED ORDER — GABAPENTIN 300 MG PO CAPS: 300.0000 mg | ORAL_CAPSULE | Freq: Three times a day (TID) | ORAL | 2 refills | Status: DC

## 2020-08-08 MED ORDER — DIVALPROEX SODIUM ER 500 MG PO TB24: 1000.0000 mg | ORAL_TABLET | Freq: Every day | ORAL | 2 refills | Status: DC

## 2020-08-08 MED ORDER — AMPHETAMINE-DEXTROAMPHET ER 20 MG PO CP24: 40.0000 mg | ORAL_CAPSULE | ORAL | 0 refills | Status: DC

## 2020-08-08 MED ORDER — BUPROPION HCL ER (XL) 300 MG PO TB24: 300.0000 mg | ORAL_TABLET | ORAL | 2 refills | Status: DC

## 2020-08-08 MED ORDER — AMPHETAMINE-DEXTROAMPHETAMINE 10 MG PO TABS: 10.0000 mg | ORAL_TABLET | Freq: Every day | ORAL | 0 refills | Status: DC

## 2020-08-08 MED ORDER — AMPHETAMINE-DEXTROAMPHETAMINE 10 MG PO TABS
10.0000 mg | ORAL_TABLET | Freq: Every day | ORAL | 0 refills | Status: DC
Start: 2020-08-08 — End: 2020-09-28

## 2020-08-08 MED ORDER — BUPROPION HCL ER (XL) 150 MG PO TB24: ORAL_TABLET | ORAL | 2 refills | Status: DC

## 2020-08-08 NOTE — Progress Notes (Signed)
Virtual Visit via Video Note  I connected with Belkys C Overdorf on 08/08/20 at 11:00 AM EDT by a video enabled telemedicine application and verified that I am speaking with the correct person using two identifiers.  Location: Patient: home Provider: office   I discussed the limitations of evaluation and management by telemedicine and the availability of in person appointments. The patient expressed understanding and agreed to proceed.   I discussed the assessment and treatment plan with the patient. The patient was provided an opportunity to ask questions and all were answered. The patient agreed with the plan and demonstrated an understanding of the instructions.   The patient was advised to call back or seek an in-person evaluation if the symptoms worsen or if the condition fails to improve as anticipated.  I provided 25 minutes of non-face-to-face time during this encounter.   Diannia Ruder, MD  [facility] MD/PA/NP OP Progress Note  23/29/2022 11:35 AM Ahliyah NAVEEN LORUSSO  MRN:  [id number]  Chief Complaint:  Chief Complaint    Anxiety; Depression; Follow-up     HPI: This patient is a 25 year old single white female who lives with her family in [place]. 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  The patient returns for follow-up with her mother after 2 months.  She states she is not doing well.  She states that her mind races all the time and she is going from being agitated upset and angry and even having suicidal thoughts to being okay.  She states that her "bipolar depression is really acting up."  She got behind in her college classes because she had allergies and missed several days of classes.  Now she is very distressed about this and extremely anxious.  She wakes up every morning with a "feeling of dread.  She has not used the clonazepam which I think would help.  Right now she denies being suicidal.  She states she has a very hard time verbalizing how she is  feeling and does better if she could take it out.  She wants to do this with her therapist Florencia Reasons but she has not addressed this with her so I suggested they make an appointment to try to explain it.  In the meantime I suggest that we increase her Depakote to 1000 mg at bedtime.  Her last level was 74 so there is a bit of room to go up.  We will check a level after about a week.  She will continue the Wellbutrin.  She is having some trouble focusing but usually does not take her afternoon Adderall and I urged her to do so.  I also urged her to take the clonazepam 0.5 mg every morning to help with the anxiety. Visit Diagnosis:    ICD-10-CM   1. Major depressive disorder, recurrent episode, moderate (HCC)  F33.1 buPROPion (WELLBUTRIN XL) 300 MG 24 hr tablet  2. ADD (attention deficit disorder) without hyperactivity  F98.8   3. Moderate mixed bipolar I disorder (HCC)  F31.62     Past Psychiatric History: Long-term outpatient treatment  Past Medical History:  Past Medical History:  Diagnosis Date  . ADHD (attention deficit hyperactivity disorder)   . Allergy   . Anxiety   . Constipation   . Environmental allergies   . Nausea   . Postural hypotension     Past Surgical History:  Procedure Laterality Date  . TYMPANOSTOMY TUBE PLACEMENT Bilateral [id number]    Family  Psychiatric History: see below  Family History:  Family History  Problem Relation Age of Onset  . Bipolar disorder Father   . Bipolar disorder Sister   . Bipolar disorder Brother   . Thyroid disease Brother   . Bipolar disorder Paternal Uncle   . Alcohol abuse Paternal Uncle   . Bipolar disorder Maternal Grandmother   . Bipolar disorder Paternal Grandfather   . Alcohol abuse Paternal Grandfather   . Bipolar disorder Paternal Grandmother   . Bipolar disorder Brother   . Bipolar disorder Brother     Social History:  Social History   Socioeconomic History  . Marital status: Single    Spouse name: Not on file   . Number of children: Not on file  . Years of education: Not on file  . Highest education level: Not on file  Occupational History  . Not on file  Tobacco Use  . Smoking status: Never Smoker  . Smokeless tobacco: Never Used  Substance and Sexual Activity  . Alcohol use: No  . Drug use: No  . Sexual activity: Never  Other Topics Concern  . Not on file  Social History Narrative  . Not on file   Social Determinants of Health   Financial Resource Strain: Not on file  Food Insecurity: No Food Insecurity  . Worried About Programme researcher, broadcasting/film/videounning Out of Food in the Last Year: Never true  . Ran Out of Food in the Last Year: Never true  Transportation Needs: No Transportation Needs  . Lack of Transportation (Medical): No  . Lack of Transportation (Non-Medical): No  Physical Activity: Not on file  Stress: Not on file  Social Connections: Not on file    Allergies:  Allergies  Allergen Reactions  . Other Hives and Shortness Of Breath    Develops hives with exposure to cold air.   . Claritin [Loratadine]     Fainting spells  . Sulfa Antibiotics   . Latex Hives    Metabolic Disorder Labs: No results found for: HGBA1C, MPG No results found for: PROLACTIN No results found for: CHOL, TRIG, HDL, CHOLHDL, VLDL, LDLCALC Lab Results  Component Value Date   TSH 4.150 04/21/2019   TSH 6.39 (H) 01/22/2019    Therapeutic Level Labs: No results found for: LITHIUM Lab Results  Component Value Date   VALPROATE 74.5 01/26/2019   No components found for:  CBMZ  Current Medications: Current Outpatient Medications  Medication Sig Dispense Refill  . divalproex (DEPAKOTE ER) 500 MG 24 hr tablet Take 2 tablets (1,000 mg total) by mouth at bedtime. 30 tablet 2  . albuterol (PROVENTIL HFA;VENTOLIN HFA) 108 (90 Base) MCG/ACT inhaler Inhale 2 puffs into the lungs every 6 (six) hours as needed for wheezing or shortness of breath. 1 Inhaler 0  . amphetamine-dextroamphetamine (ADDERALL XR) 20 MG 24 hr  capsule Take 2 capsules (40 mg total) by mouth every morning. 60 capsule 0  . amphetamine-dextroamphetamine (ADDERALL XR) 20 MG 24 hr capsule Take 2 capsules (40 mg total) by mouth every morning. 60 capsule 0  . amphetamine-dextroamphetamine (ADDERALL XR) 20 MG 24 hr capsule Take 2 capsules (40 mg total) by mouth every morning. 60 capsule 0  . amphetamine-dextroamphetamine (ADDERALL) 10 MG tablet Take 1 tablet (10 mg total) by mouth daily. 30 tablet 0  . amphetamine-dextroamphetamine (ADDERALL) 10 MG tablet Take 1 tablet (10 mg total) by mouth daily. 30 tablet 0  . amphetamine-dextroamphetamine (ADDERALL) 10 MG tablet Take 1 tablet (10 mg total) by mouth daily. 30  tablet 0  . buPROPion (WELLBUTRIN XL) 150 MG 24 hr tablet 450 mg daily. Take along with 300 mg tab 30 tablet 2  . buPROPion (WELLBUTRIN XL) 300 MG 24 hr tablet Take 1 tablet (300 mg total) by mouth every morning. 90 tablet 2  . clindamycin-benzoyl peroxide (BENZACLIN) gel APPLY ON THE SKIN DAILY TO FACE AT NIGHT  3  . clonazePAM (KLONOPIN) 0.5 MG tablet Take 1 tablet (0.5 mg total) by mouth 2 (two) times daily as needed for anxiety. 60 tablet 2  . desloratadine (CLARINEX) 5 MG tablet Take 5 mg by mouth daily.    . fluticasone (FLONASE) 50 MCG/ACT nasal spray Place 2 sprays into both nostrils daily. 16 g 4  . gabapentin (NEURONTIN) 300 MG capsule Take 1 capsule (300 mg total) by mouth 3 (three) times daily. 270 capsule 2  . levothyroxine (SYNTHROID) 25 MCG tablet TAKE ONE TABLET BY MOUTH EVERY MORNING BEFORE BREAKFAST 30 tablet 0  . levothyroxine (SYNTHROID) 50 MCG tablet Take 1 tablet (50 mcg total) by mouth daily before breakfast. 90 tablet 3  . montelukast (SINGULAIR) 10 MG tablet Take 10 mg by mouth at bedtime.    . naproxen (NAPROSYN) 500 MG tablet TAKE 1 TAB BY MOUTH 2 (TWO) TIMES DAILY WITH A MEAL. AS NEEDED FOR MENSTRUAL CRAMPING. 30 tablet 0  . norgestrel-ethinyl estradiol (LOW-OGESTREL) 0.3-30 MG-MCG tablet TAKE ONE TABLET BY  MOUTH DAILY CONTINUOUSLY SKIP SUGAR PILL WEEK 28 tablet 3  . Olopatadine HCl 0.6 % SOLN U 2 SPRAYS IEN BID  1  . ondansetron (ZOFRAN) 4 MG tablet Take 1 tablet (4 mg total) by mouth every 8 (eight) hours as needed for nausea or vomiting. 30 tablet 1  . spironolactone (ALDACTONE) 25 MG tablet TK 1 T PO BID  2  . traZODone (DESYREL) 100 MG tablet Take 1 tablet (100 mg total) by mouth at bedtime. 90 tablet 2   No current facility-administered medications for this visit.     Musculoskeletal: Strength & Muscle Tone: within normal limits Gait & Station: normal Patient leans: N/A  Psychiatric Specialty Exam: Review of Systems  Psychiatric/Behavioral: Positive for decreased concentration and dysphoric mood. The patient is nervous/anxious.   All other systems reviewed and are negative.   There were no vitals taken for this visit.There is no height or weight on file to calculate BMI.  General Appearance: Casual and Fairly Groomed  Eye Contact:  Fair  Speech:  Clear and Coherent  Volume:  Normal  Mood:  Anxious and Depressed  Affect:  Labile and Tearful  Thought Process:  Goal Directed  Orientation:  Full (Time, Place, and Person)  Thought Content: Rumination   Suicidal Thoughts:  No  Homicidal Thoughts:  No  Memory:  Immediate;   Good Recent;   Good Remote;   Fair  Judgement:  Fair  Insight:  Fair  Psychomotor Activity:  Normal  Concentration:  Concentration: Fair and Attention Span: Fair  Recall:  Good  Fund of Knowledge: Good  Language: Good  Akathisia:  No  Handed:  Right  AIMS (if indicated): not done  Assets:  Communication Skills Desire for Improvement Physical Health Resilience Social Support Talents/Skills  ADL's:  Intact  Cognition: WNL  Sleep:  Good   Screenings: GAD-7   Flowsheet Row Office Visit from 12/20/2019 in [facility] Office Visit from 04/21/2019 in [facility]  Total  GAD-7 Score 3 2    PHQ2-9  Flowsheet Row [facility] Office Visit from 12/20/2019 in [facility] Office Visit from 04/21/2019 in [facility] Office Visit from 02/17/2018 in [facility] Office Visit from 08/22/2017 in [facility]  PHQ-2 Total Score 3 3 0 1 2  PHQ-9 Total Score 18 5 6  -- 9    Flowsheet Row Video Visit from 08/08/2020 in [facility]  C-SSRS RISK CATEGORY Error: Question 6 not populated       Assessment and Plan: This patient is a 25 year old female with a history of depression anger irritability which at this point sounds primarily like bipolar disorder, mixed state.  She also has ADD.  She has been having more mood shifts that go from hour to hour.  For this reason we will increase Depakote ER to 1000 mg at bedtime.  She will check a Depakote level in about 1 week.  She will continue Wellbutrin XL 150 mg daily for depression, gabapentin 300 mg 3 times daily for anxiety, trazodone 100 mg at bedtime for sleep, clonazepam 0.5 mg twice daily as needed.  And Adderall XR 40 mg in the morning and Adderall 10 mg later in the day for focus.  I explained that she needs to take all medications as prescribed.  She will return to see me in 2 weeks and continue her therapy with 21.  I urged her to tell me spine more about her concerns about communication.   Florencia Reasons, MD 08/08/2020, 11:35 AM

## 2020-08-09 ENCOUNTER — Telehealth: Payer: Self-pay

## 2020-08-09 NOTE — Telephone Encounter (Signed)
Patient has a appt schd and is needing a refill on her medication    levothyroxine (SYNTHROID) 50 MCG tablet  [facility] [place], [place] - [address]

## 2020-08-11 ENCOUNTER — Other Ambulatory Visit: Payer: Self-pay | Admitting: Endocrinology

## 2020-08-14 ENCOUNTER — Other Ambulatory Visit: Payer: Self-pay

## 2020-08-14 NOTE — Telephone Encounter (Signed)
LVM for pt to contact the office with the correct dose of Levothyroxine that she is taking bc there is 2 on file and also advised her that if a refill is granted it will only cover her up until her appt with Everardo All and then once she has been seen, more refills will be granted.

## 2020-08-15 ENCOUNTER — Telehealth (HOSPITAL_COMMUNITY): Payer: Self-pay | Admitting: Psychiatry

## 2020-08-15 NOTE — Telephone Encounter (Signed)
Called to re-schedule f/u appt, left vm 

## 2020-08-16 ENCOUNTER — Ambulatory Visit (HOSPITAL_COMMUNITY): Payer: Medicaid Other | Admitting: Psychiatry

## 2020-08-16 ENCOUNTER — Other Ambulatory Visit: Payer: Self-pay

## 2020-08-18 DIAGNOSIS — F3162 Bipolar disorder, current episode mixed, moderate: Secondary | ICD-10-CM | POA: Diagnosis not present

## 2020-08-19 LAB — VALPROIC ACID LEVEL: Valproic Acid Lvl: 60 mg/L (ref 50.0–100.0)

## 2020-08-21 ENCOUNTER — Ambulatory Visit (INDEPENDENT_AMBULATORY_CARE_PROVIDER_SITE_OTHER): Payer: Medicaid Other | Admitting: Psychiatry

## 2020-08-21 ENCOUNTER — Other Ambulatory Visit: Payer: Self-pay

## 2020-08-21 DIAGNOSIS — F3162 Bipolar disorder, current episode mixed, moderate: Secondary | ICD-10-CM | POA: Diagnosis not present

## 2020-08-21 DIAGNOSIS — F331 Major depressive disorder, recurrent, moderate: Secondary | ICD-10-CM | POA: Diagnosis not present

## 2020-08-21 NOTE — Progress Notes (Signed)
Tell pt and/or mom that level is good, continue Depakote 1000 mg

## 2020-08-21 NOTE — Progress Notes (Signed)
Virtual Visit via Video Note  I connected with Samantha Tucker on 08/21/20 at 11:07 AM EDT by a video enabled telemedicine application and verified that I am speaking with the correct person using two identifiers.  Location: Patient: Car Provider: [facility] [place] office    I discussed the limitations of evaluation and management by telemedicine and the availability of in person appointments. The patient expressed understanding and agreed to proceed.  I provided 53 minutes of non-face-to-face time during this encounter.   Adah Salvage, LCSW    THERAPIST PROGRESS NOTE  Session Time: Monday  08/21/2020 11:07 AM - 12:00 PM    Participation Level: Active  Behavioral Response: CasualAlertAnxious and Depressed  Type of Therapy: Individual Therapy  Treatment Goals addressed: Patient states wanting to be less broken, be able to resume school, attend family functions, go to movies and other recreational events/develop the ability to recognize, accept and cope with feelings of depression, elevate mood and show evidence of usual energy at, activities, and socialization level  Interventions: CBT and Supportive  Summary: Samantha Tucker is a 25 y.o. female who is referred for services due to depression and anxiety.  She denies any psychiatric hospitalizations.  She reports participated in therapy briefly at age 89 and again at age 64.  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  Patient last was seen via virtual visit  about 2 months ago.  She  states things have been up and down.  She reports breaking out with her boyfriend and says this was a mutual decision.  They have remained friends.  Patient expresses frustration because she still has difficulty verbalizing her thoughts.  She reports being easily frustrated and does better when writing out or typing her thoughts.  She expresses frustration regarding relationship with mother but is vague.   She reports sometimes having thoughts of death but adamantly denies any plan or intent to harm self.  She expresses frustration as she states people just do not understand her brain will not allow her to talk about what is bothering her.  She reports becoming easily frustrated and difficulty gathering her thoughts.  She expresses frustration she is not able to talk freely in session with psychiatrist as she does not want to discuss certain issues in front of her mother as mother wants to be involved in the visit.   Suicidal/Homicidal: Nowithout intent/plan  Therapist Response: Reviewed symptoms, administered PHQ 2 and 9 with C-SS RS, provided patient with crisis contact information and phone numbers, encouraged patient to keep upcoming appointment with psychiatrist Dr. Tenny Craw and discuss her concerns about difficulty with getting her thoughts and medication, discussed possibility of therapist work with patient and mother to facilitate more support for patient, patient does not want to have session including mother, began to explore ways to help patient be able to effectively communicate her feelings, discussed possibility of patient journaling thoughts and feelings on notes on  phone/ paper sharing in session  assisted patient explore her thoughts about trying to communicate orally. eviewed coping and relaxation techniques to cope with frustration Plan: Return again in 2 weeks.  Diagnosis: Axis I: MDD, ADHD      Adah Salvage, LCSW 08/21/2020

## 2020-08-23 ENCOUNTER — Encounter (HOSPITAL_COMMUNITY): Payer: Self-pay | Admitting: Psychiatry

## 2020-08-23 ENCOUNTER — Other Ambulatory Visit: Payer: Self-pay | Admitting: Endocrinology

## 2020-08-23 ENCOUNTER — Telehealth (INDEPENDENT_AMBULATORY_CARE_PROVIDER_SITE_OTHER): Payer: Medicaid Other | Admitting: Psychiatry

## 2020-08-23 ENCOUNTER — Other Ambulatory Visit: Payer: Self-pay

## 2020-08-23 DIAGNOSIS — F988 Other specified behavioral and emotional disorders with onset usually occurring in childhood and adolescence: Secondary | ICD-10-CM

## 2020-08-23 DIAGNOSIS — F3162 Bipolar disorder, current episode mixed, moderate: Secondary | ICD-10-CM

## 2020-08-23 MED ORDER — CARIPRAZINE HCL 1.5 MG PO CAPS: 1.5000 mg | ORAL_CAPSULE | Freq: Every day | ORAL | 2 refills | Status: DC

## 2020-08-23 NOTE — Progress Notes (Signed)
Virtual Visit via Video Note  I connected with Samantha Tucker on 08/23/20 at  9:00 AM EDT by a video enabled telemedicine application and verified that I am speaking with the correct person using two identifiers.  Location: Patient: home Provider: home   I discussed the limitations of evaluation and management by telemedicine and the availability of in person appointments. The patient expressed understanding and agreed to proceed.      I discussed the assessment and treatment plan with the patient. The patient was provided an opportunity to ask questions and all were answered. The patient agreed with the plan and demonstrated an understanding of the instructions.   The patient was advised to call back or seek an in-person evaluation if the symptoms worsen or if the condition fails to improve as anticipated.  I provided 15 minutes of non-face-to-face time during this encounter.   Diannia Rudereborah Leanord Thibeau, MD  [facility] MD/PA/NP OP Progress Note  08/23/2020 9:20 AM Samantha Tucker  MRN:  [id number]  Chief Complaint:  Chief Complaint    Depression; Anxiety; Follow-up     HPI: This patient is a 25 year old single white female who lives [place] her [place] in [place].  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  Last time she was very depressed but was also having agitation and mind racing symptoms as well as significant anxiety.  I did increase her Depakote 2000 mg at bedtime.  Her latest level is 60.  She does not feel like it is helped all that much.  She is still up-and-down throughout the day.  Sometimes she feels okay and then performance in the depression.  She has had suicidal thoughts but claims she would not act on them and does not have them today she is sleeping and eating okay and she is able to do her work but the school work feels overwhelming and creating a great deal of anxiety because she has got  behind he does not see any way to catch up.  I urged the patient and mother to try to work with the college to see what can be done or if she needs to withdraw.  In the interim we will try adding another mood stabilizer specifically for the bipolar depression, we will start Vraylar 1.5 mg daily Visit Diagnosis:    ICD-10-CM   1. Moderate mixed bipolar I disorder (HCC)  F31.62   2. 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 ADHD (attention deficit hyperactivity disorder)   . Allergy   . Anxiety   . Constipation   . Environmental allergies   . Nausea   . Postural hypotension     Past Surgical History:  Procedure Laterality Date  . TYMPANOSTOMY TUBE PLACEMENT Bilateral [id number]    Family Psychiatric History: see below  Family History:  Family History  Problem Relation Age of Onset  . Bipolar disorder Father   . Bipolar disorder Sister   . Bipolar disorder Brother   . Thyroid disease Brother   . Bipolar disorder Paternal Uncle   . Alcohol abuse Paternal Uncle   . Bipolar disorder Maternal Grandmother   . Bipolar disorder Paternal Grandfather   . Alcohol abuse Paternal Grandfather   . Bipolar disorder Paternal Grandmother   .  Bipolar disorder Brother   . Bipolar disorder Brother     Social History:  Social History   Socioeconomic History  . Marital status: Single    Spouse name: Not on file  . Number of children: Not on file  . Years of education: Not on file  . Highest education level: Not on file  Occupational History  . Not on file  Tobacco Use  . Smoking status: Never Smoker  . Smokeless tobacco: Never Used  Substance and Sexual Activity  . Alcohol use: No  . Drug use: No  . Sexual activity: Never  Other Topics Concern  . Not on file  Social History Narrative  . Not on file   Social Determinants of Health    Financial Resource Strain: Not on file  Food Insecurity: No Food Insecurity  . Worried About Programme researcher, broadcasting/film/video in the Last Year: Never true  . Ran Out of Food in the Last Year: Never true  Transportation Needs: No Transportation Needs  . Lack of Transportation (Medical): No  . Lack of Transportation (Non-Medical): No  Physical Activity: Not on file  Stress: Not on file  Social Connections: Not on file    Allergies:  Allergies  Allergen Reactions  . Other Hives and Shortness Of Breath    Develops hives with exposure to cold air.   . Claritin [Loratadine]     Fainting spells  . Sulfa Antibiotics   . Latex Hives    Metabolic Disorder Labs: No results found for: HGBA1C, MPG No results found for: PROLACTIN No results found for: CHOL, TRIG, HDL, CHOLHDL, VLDL, LDLCALC Lab Results  Component Value Date   TSH 4.150 04/21/2019   TSH 6.39 (H) 01/22/2019    Therapeutic Level Labs: No results found for: LITHIUM Lab Results  Component Value Date   VALPROATE 60.0 08/18/2020   VALPROATE 74.5 01/26/2019   No components found for:  CBMZ  Current Medications: Current Outpatient Medications  Medication Sig Dispense Refill  . cariprazine (VRAYLAR) capsule Take 1 capsule (1.5 mg total) by mouth daily. 30 capsule 2  . albuterol (PROVENTIL HFA;VENTOLIN HFA) 108 (90 Base) MCG/ACT inhaler Inhale 2 puffs into the lungs every 6 (six) hours as needed for wheezing or shortness of breath. 1 Inhaler 0  . amphetamine-dextroamphetamine (ADDERALL XR) 20 MG 24 hr capsule Take 2 capsules (40 mg total) by mouth every morning. 60 capsule 0  . amphetamine-dextroamphetamine (ADDERALL XR) 20 MG 24 hr capsule Take 2 capsules (40 mg total) by mouth every morning. 60 capsule 0  . amphetamine-dextroamphetamine (ADDERALL XR) 20 MG 24 hr capsule Take 2 capsules (40 mg total) by mouth every morning. 60 capsule 0  . amphetamine-dextroamphetamine (ADDERALL) 10 MG tablet Take 1 tablet (10 mg total) by mouth  daily. 30 tablet 0  . amphetamine-dextroamphetamine (ADDERALL) 10 MG tablet Take 1 tablet (10 mg total) by mouth daily. 30 tablet 0  . amphetamine-dextroamphetamine (ADDERALL) 10 MG tablet Take 1 tablet (10 mg total) by mouth daily. 30 tablet 0  . buPROPion (WELLBUTRIN XL) 150 MG 24 hr tablet 450 mg daily. Take along with 300 mg tab 30 tablet 2  . buPROPion (WELLBUTRIN XL) 300 MG 24 hr tablet Take 1 tablet (300 mg total) by mouth every morning. 90 tablet 2  . clindamycin-benzoyl peroxide (BENZACLIN) gel APPLY ON THE SKIN DAILY TO FACE AT NIGHT  3  . clonazePAM (KLONOPIN) 0.5 MG tablet Take 1 tablet (0.5 mg total) by mouth 2 (two) times daily as needed  for anxiety. 60 tablet 2  . desloratadine (CLARINEX) 5 MG tablet Take 5 mg by mouth daily.    . divalproex (DEPAKOTE ER) 500 MG 24 hr tablet Take 2 tablets (1,000 mg total) by mouth at bedtime. 30 tablet 2  . fluticasone (FLONASE) 50 MCG/ACT nasal spray Place 2 sprays into both nostrils daily. 16 g 4  . gabapentin (NEURONTIN) 300 MG capsule Take 1 capsule (300 mg total) by mouth 3 (three) times daily. 270 capsule 2  . levothyroxine (SYNTHROID) 25 MCG tablet TAKE ONE TABLET BY MOUTH EVERY MORNING BEFORE BREAKFAST 30 tablet 0  . levothyroxine (SYNTHROID) 50 MCG tablet Take 1 tablet (50 mcg total) by mouth daily before breakfast. 90 tablet 3  . montelukast (SINGULAIR) 10 MG tablet Take 10 mg by mouth at bedtime.    . naproxen (NAPROSYN) 500 MG tablet TAKE 1 TAB BY MOUTH 2 (TWO) TIMES DAILY WITH A MEAL. AS NEEDED FOR MENSTRUAL CRAMPING. 30 tablet 0  . norgestrel-ethinyl estradiol (LOW-OGESTREL) 0.3-30 MG-MCG tablet TAKE ONE TABLET BY MOUTH DAILY CONTINUOUSLY SKIP SUGAR PILL WEEK 28 tablet 3  . Olopatadine HCl 0.6 % SOLN U 2 SPRAYS IEN BID  1  . ondansetron (ZOFRAN) 4 MG tablet Take 1 tablet (4 mg total) by mouth every 8 (eight) hours as needed for nausea or vomiting. 30 tablet 1  . spironolactone (ALDACTONE) 25 MG tablet TK 1 T PO BID  2  . traZODone  (DESYREL) 100 MG tablet Take 1 tablet (100 mg total) by mouth at bedtime. 90 tablet 2   No current facility-administered medications for this visit.     Musculoskeletal: Strength & Muscle Tone: within normal limits Gait & Station: normal Patient leans: N/A  Psychiatric Specialty Exam: Review of Systems  Psychiatric/Behavioral: Positive for decreased concentration, dysphoric mood and suicidal ideas. The patient is nervous/anxious.   All other systems reviewed and are negative.   There were no vitals taken for this visit.There is no height or weight on file to calculate BMI.  General Appearance: Casual and Fairly Groomed  Eye Contact:  Good  Speech:  Clear and Coherent  Volume:  Normal  Mood:  Anxious and Depressed  Affect:  Depressed and Tearful  Thought Process:  Goal Directed  Orientation:  Full (Time, Place, and Person)  Thought Content: Rumination   Suicidal Thoughts:  Yes.  without intent/plan  Homicidal Thoughts:  No  Memory:  Immediate;   Good Recent;   Good Remote;   Good  Judgement:  Fair  Insight:  Fair  Psychomotor Activity:  Decreased  Concentration:  Concentration: Fair and Attention Span: Fair  Recall:  Good  Fund of Knowledge: Good  Language: Good  Akathisia:  No  Handed:  Right  AIMS (if indicated): not done  Assets:  Communication Skills Desire for Improvement Physical Health Resilience Social Support Talents/Skills  ADL's:  Intact  Cognition: WNL  Sleep:  Good   Screenings: GAD-7   Flowsheet Row Office Visit from 12/20/2019 in [facility] Office Visit from 04/21/2019 in [facility] for [facility]  Total GAD-7 Score 3 2    PHQ2-9   Flowsheet Row [facility]2020 [facility] [facility] from 08/21/2020 in [facility] Video Visit from 08/08/2020 [facility] Office Visit from 12/20/2019 in [facility] Office Visit from 04/21/2019 in [place] [facility]  PHQ-2 Total Score 6 4 3 3  0  PHQ-9 Total Score 14 17 18 5 6     Flowsheet Row Video Visit from 08/23/2020 in [facility] Counselor from 08/21/2020 in [facility] Video Visit from 08/08/2020 in [facility]  C-SSRS RISK CATEGORY Error: Q7 should not be populated when Q6 is No Low Risk Error: Question 6 not populated       Assessment and Plan: This patient is a 25 year old female with a history of depression anger irritability consistent with bipolar disorder, mixed state.  At this point we will add Vraylar 1.5 mg to target the bipolar depression.  She will continue all other medications as prescribed.  I will see her back in 2 weeks.  I urged her to think seriously about whether or not it is prudent for her to continue in college right now when she is so stressed.  She denies any thoughts of self-harm today and she is not constant observation by her family so I do not think she is at any risk of self-harm.   08/10/2020, MD 08/23/2020, 9:20 AM

## 2020-08-23 NOTE — Telephone Encounter (Signed)
Patient called re: Samantha Tucker has still not received RX for the correct dosage of Levothyroxine. Patient states the correct dosage is as follows:  levothyroxine (SYNTHROID) 50 MCG tablet  Patient states she has been out of the above medication for 2 months due to wrong dosage/denial from [facility].  Patient requests RX for levothyroxine (SYNTHROID) 50 MCG tablet be sent asap to   [facility] [place], [place] - [address] Phone:  [phone]  Fax:  [phone]     Patient is scheduled for appointment on 09/08/20.

## 2020-08-24 ENCOUNTER — Other Ambulatory Visit: Payer: Self-pay

## 2020-08-24 ENCOUNTER — Encounter (HOSPITAL_COMMUNITY): Payer: Self-pay

## 2020-08-24 MED ORDER — LEVOTHYROXINE SODIUM 50 MCG PO TABS: 50.0000 ug | ORAL_TABLET | Freq: Every day | ORAL | 0 refills | Status: DC

## 2020-08-25 NOTE — Telephone Encounter (Signed)
Rx sent to pharmacy   

## 2020-08-28 ENCOUNTER — Telehealth (HOSPITAL_COMMUNITY): Payer: Medicaid Other | Admitting: Psychiatry

## 2020-09-08 ENCOUNTER — Ambulatory Visit (INDEPENDENT_AMBULATORY_CARE_PROVIDER_SITE_OTHER): Payer: Medicaid Other | Admitting: Endocrinology

## 2020-09-08 ENCOUNTER — Other Ambulatory Visit: Payer: Self-pay

## 2020-09-08 VITALS — BP 110/70 | HR 116 | Ht 59.0 in | Wt 130.6 lb

## 2020-09-08 DIAGNOSIS — E039 Hypothyroidism, unspecified: Secondary | ICD-10-CM

## 2020-09-08 MED ORDER — LEVOTHYROXINE SODIUM 50 MCG PO TABS: 50.0000 ug | ORAL_TABLET | Freq: Every day | ORAL | 3 refills | Status: DC

## 2020-09-08 NOTE — Progress Notes (Signed)
Subjective:    Patient ID: Samantha Tucker, female    DOB: 11/10/1995, 25 y.o.   MRN: [id number]  HPI Pt returns for f/u of hypothyroidism (dx'ed early 2019; she was rx'ed low-dosage synthroid; she has never had thyroid imaging; she is not at risk for pregnancy).  pt states she feels well in general.  She denies neck swelling.  Pt verifies synthroid is 50/d.  Pt says she has not missed it in the past 2 weeks.  She has reg menses.  Pt says she is not at risk for pregnancy.   Past Medical History:  Diagnosis Date  . ADHD (attention deficit hyperactivity disorder)   . Allergy   . Anxiety   . Constipation   . Environmental allergies   . Nausea   . Postural hypotension     Past Surgical History:  Procedure Laterality Date  . TYMPANOSTOMY TUBE PLACEMENT Bilateral [id number]    Social History   Socioeconomic History  . Marital status: Single    Spouse name: Not on file  . Number of children: Not on file  . Years of education: Not on file  . Highest education level: Not on file  Occupational History  . Not on file  Tobacco Use  . Smoking status: Never Smoker  . Smokeless tobacco: Never Used  Substance and Sexual Activity  . Alcohol use: No  . Drug use: No  . Sexual activity: Never  Other Topics Concern  . Not on file  Social History Narrative  . Not on file   Social Determinants of Health   Financial Resource Strain: Not on file  Food Insecurity: No Food Insecurity  . Worried About Programme researcher, broadcasting/film/video in the Last Year: Never true  . Ran Out of Food in the Last Year: Never true  Transportation Needs: No Transportation Needs  . Lack of Transportation (Medical): No  . Lack of Transportation (Non-Medical): No  Physical Activity: Not on file  Stress: Not on file  Social Connections: Not on file  Intimate Partner Violence: Not on file    Current Outpatient Medications on File Prior to Visit  Medication Sig Dispense Refill  . albuterol (PROVENTIL HFA;VENTOLIN HFA) 108 (90  Base) MCG/ACT inhaler Inhale 2 puffs into the lungs every 6 (six) hours as needed for wheezing or shortness of breath. 1 Inhaler 0  . amphetamine-dextroamphetamine (ADDERALL XR) 20 MG 24 hr capsule Take 2 capsules (40 mg total) by mouth every morning. 60 capsule 0  . amphetamine-dextroamphetamine (ADDERALL) 10 MG tablet Take 1 tablet (10 mg total) by mouth daily. 30 tablet 0  . buPROPion (WELLBUTRIN XL) 150 MG 24 hr tablet 450 mg daily. Take along with 300 mg tab 30 tablet 2  . buPROPion (WELLBUTRIN XL) 300 MG 24 hr tablet Take 1 tablet (300 mg total) by mouth every morning. 90 tablet 2  . cariprazine (VRAYLAR) capsule Take 1 capsule (1.5 mg total) by mouth daily. 30 capsule 2  . clindamycin-benzoyl peroxide (BENZACLIN) gel APPLY ON THE SKIN DAILY TO FACE AT NIGHT  3  . clonazePAM (KLONOPIN) 0.5 MG tablet Take 1 tablet (0.5 mg total) by mouth 2 (two) times daily as needed for anxiety. 60 tablet 2  . desloratadine (CLARINEX) 5 MG tablet Take 5 mg by mouth daily.    . divalproex (DEPAKOTE ER) 500 MG 24 hr tablet Take 2 tablets (1,000 mg total) by mouth at bedtime. 30 tablet 2  . fluticasone (FLONASE) 50 MCG/ACT nasal spray Place 2 sprays into  both nostrils daily. 16 g 4  . gabapentin (NEURONTIN) 300 MG capsule Take 1 capsule (300 mg total) by mouth 3 (three) times daily. 270 capsule 2  . montelukast (SINGULAIR) 10 MG tablet Take 10 mg by mouth at bedtime.    . naproxen (NAPROSYN) 500 MG tablet TAKE 1 TAB BY MOUTH 2 (TWO) TIMES DAILY WITH A MEAL. AS NEEDED FOR MENSTRUAL CRAMPING. 30 tablet 0  . norgestrel-ethinyl estradiol (LOW-OGESTREL) 0.3-30 MG-MCG tablet TAKE ONE TABLET BY MOUTH DAILY CONTINUOUSLY SKIP SUGAR PILL WEEK 28 tablet 3  . Olopatadine HCl 0.6 % SOLN U 2 SPRAYS IEN BID  1  . ondansetron (ZOFRAN) 4 MG tablet Take 1 tablet (4 mg total) by mouth every 8 (eight) hours as needed for nausea or vomiting. 30 tablet 1  . spironolactone (ALDACTONE) 25 MG tablet TK 1 T PO BID  2  . traZODone  (DESYREL) 100 MG tablet Take 1 tablet (100 mg total) by mouth at bedtime. 90 tablet 2   No current facility-administered medications on file prior to visit.    Allergies  Allergen Reactions  . Other Hives and Shortness Of Breath    Develops hives with exposure to cold air.   . Claritin [Loratadine]     Fainting spells  . Sulfa Antibiotics   . Latex Hives    Family History  Problem Relation Age of Onset  . Bipolar disorder Father   . Bipolar disorder Sister   . Bipolar disorder Brother   . Thyroid disease Brother   . Bipolar disorder Paternal Uncle   . Alcohol abuse Paternal Uncle   . Bipolar disorder Maternal Grandmother   . Bipolar disorder Paternal Grandfather   . Alcohol abuse Paternal Grandfather   . Bipolar disorder Paternal Grandmother   . Bipolar disorder Brother   . Bipolar disorder Brother     BP 110/70 (BP Location: Right Arm, Patient Position: Sitting, Cuff Size: Normal)   Pulse (!) 116   Ht 4\' 11"  (1.499 m)   Wt 130 lb 9.6 oz (59.2 kg)   SpO2 99%   BMI 26.38 kg/m    Review of Systems     Objective:   Physical Exam VITAL SIGNS:  See vs page GENERAL: no distress NECK: There is no palpable thyroid enlargement.  No thyroid nodule is palpable.  No palpable lymphadenopathy at the anterior neck.       Assessment & Plan:  Hypothyroidism Noncompliance with medication.  We'll recheck TFT when back on rx x at least 1 month  Patient Instructions  Blood tests are requested for you today.  Please do in 1 more month.  We'll let you know about the results.   Please come back for a follow-up appointment in 6 months.  Please call sooner if a pregnancy happens.

## 2020-09-08 NOTE — Patient Instructions (Addendum)
Blood tests are requested for you today.  Please do in 1 more month.  We'll let you know about the results.   Please come back for a follow-up appointment in 6 months.  Please call sooner if a pregnancy happens.

## 2020-09-18 ENCOUNTER — Other Ambulatory Visit (HOSPITAL_COMMUNITY): Payer: Self-pay | Admitting: Psychiatry

## 2020-09-25 ENCOUNTER — Telehealth (HOSPITAL_COMMUNITY): Payer: Self-pay | Admitting: *Deleted

## 2020-09-25 NOTE — Telephone Encounter (Signed)
Opened in Error.

## 2020-09-28 ENCOUNTER — Telehealth (INDEPENDENT_AMBULATORY_CARE_PROVIDER_SITE_OTHER): Payer: Medicaid Other | Admitting: Psychiatry

## 2020-09-28 ENCOUNTER — Other Ambulatory Visit: Payer: Self-pay

## 2020-09-28 ENCOUNTER — Encounter (HOSPITAL_COMMUNITY): Payer: Self-pay | Admitting: Psychiatry

## 2020-09-28 DIAGNOSIS — F331 Major depressive disorder, recurrent, moderate: Secondary | ICD-10-CM

## 2020-09-28 DIAGNOSIS — F3162 Bipolar disorder, current episode mixed, moderate: Secondary | ICD-10-CM

## 2020-09-28 DIAGNOSIS — F988 Other specified behavioral and emotional disorders with onset usually occurring in childhood and adolescence: Secondary | ICD-10-CM

## 2020-09-28 MED ORDER — AMPHETAMINE-DEXTROAMPHET ER 20 MG PO CP24: 40.0000 mg | ORAL_CAPSULE | ORAL | 0 refills | Status: DC

## 2020-09-28 MED ORDER — BUPROPION HCL ER (XL) 300 MG PO TB24: 300.0000 mg | ORAL_TABLET | ORAL | 2 refills | Status: DC

## 2020-09-28 MED ORDER — AMPHETAMINE-DEXTROAMPHETAMINE 10 MG PO TABS: 10.0000 mg | ORAL_TABLET | Freq: Every day | ORAL | 0 refills | Status: DC

## 2020-09-28 MED ORDER — TRAZODONE HCL 100 MG PO TABS: 100.0000 mg | ORAL_TABLET | Freq: Every day | ORAL | 2 refills | Status: DC

## 2020-09-28 MED ORDER — DIVALPROEX SODIUM ER 500 MG PO TB24: ORAL_TABLET | ORAL | 2 refills | Status: DC

## 2020-09-28 MED ORDER — CARIPRAZINE HCL 1.5 MG PO CAPS: 1.5000 mg | ORAL_CAPSULE | Freq: Every day | ORAL | 2 refills | Status: DC

## 2020-09-28 MED ORDER — GABAPENTIN 300 MG PO CAPS: 300.0000 mg | ORAL_CAPSULE | Freq: Three times a day (TID) | ORAL | 2 refills | Status: DC

## 2020-09-28 MED ORDER — BUPROPION HCL ER (XL) 150 MG PO TB24: ORAL_TABLET | ORAL | 2 refills | Status: DC

## 2020-09-28 NOTE — Progress Notes (Signed)
Virtual Visit via Video Note  I connected with Samantha Tucker on 09/28/20 at  1:20 PM EDT by a video enabled telemedicine application and verified that I am speaking with the correct person using two identifiers.  Location: Patient: home Provider: office   I discussed the limitations of evaluation and management by telemedicine and the availability of in person appointments. The patient expressed understanding and agreed to proceed.      I discussed the assessment and treatment plan with the patient. The patient was provided an opportunity to ask questions and all were answered. The patient agreed with the plan and demonstrated an understanding of the instructions.   The patient was advised to call back or seek an in-person evaluation if the symptoms worsen or if the condition fails to improve as anticipated.  I provided 15 minutes of non-face-to-face time during this encounter.   Diannia Ruder, MD  [facility] MD/PA/NP OP Progress Note  09/28/2020 2:52 PM Samantha Tucker  MRN:  [id number]  Chief Complaint:  Chief Complaint    Anxiety; Depression; Manic Behavior     HPI: This patient is a 25 year old single white female who lives with her family in [place].  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  Last time she was having a lot of rapid mood shifting.  We did add Vraylar 1.5 mg daily to her regimen.  It seems to have helped.  She was able to pass 2 of her college courses and will be taking a math class over the summer.  She seems a lot calmer.  Her mother agrees that she seems to be doing much better.  She is eating and sleeping well.  She is able to stay focused and continue doing her job.  She denies any thoughts of self-harm or suicidal ideation. Visit Diagnosis:    ICD-10-CM   1. Moderate mixed bipolar I disorder (HCC)  F31.62   2. Major depressive disorder, recurrent episode, moderate  (HCC)  F33.1 buPROPion (WELLBUTRIN XL) 300 MG 24 hr tablet  3. 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 ADHD (attention deficit hyperactivity disorder)   . Allergy   . Anxiety   . Constipation   . Environmental allergies   . Nausea   . Postural hypotension     Past Surgical History:  Procedure Laterality Date  . TYMPANOSTOMY TUBE PLACEMENT Bilateral [id number]    Family Psychiatric History: see below  Family History:  Family History  Problem Relation Age of Onset  . Bipolar disorder Father   . Bipolar disorder Sister   . Bipolar disorder Brother   . Thyroid disease Brother   . Bipolar disorder Paternal Uncle   . Alcohol abuse Paternal Uncle   . Bipolar disorder Maternal Grandmother   . Bipolar disorder Paternal Grandfather   . Alcohol abuse Paternal Grandfather   . Bipolar disorder Paternal Grandmother   . Bipolar disorder Brother   . Bipolar disorder Brother     Social History:  Social History   Socioeconomic History  . Marital status: Single    Spouse name: Not on file  . Number of children: Not on file  . Years of education: Not on file  . Highest education level: Not on file  Occupational History  . Not on file  Tobacco Use  . Smoking status: Never Smoker  . Smokeless tobacco: Never Used  Substance and Sexual Activity  . Alcohol use: No  . Drug use: No  . Sexual activity: Never  Other Topics Concern  . Not on file  Social History Narrative  . Not on file   Social Determinants of Health   Financial Resource Strain: Not on file  Food Insecurity: No Food Insecurity  . Worried About Programme researcher, broadcasting/film/videounning Out of Food in the Last Year: Never true  . Ran Out of Food in the Last Year: Never true  Transportation Needs: No Transportation Needs  . Lack of Transportation (Medical): No  . Lack of Transportation (Non-Medical):  No  Physical Activity: Not on file  Stress: Not on file  Social Connections: Not on file    Allergies:  Allergies  Allergen Reactions  . Other Hives and Shortness Of Breath    Develops hives with exposure to cold air.   . Claritin [Loratadine]     Fainting spells  . Sulfa Antibiotics   . Latex Hives    Metabolic Disorder Labs: No results found for: HGBA1C, MPG No results found for: PROLACTIN No results found for: CHOL, TRIG, HDL, CHOLHDL, VLDL, LDLCALC Lab Results  Component Value Date   TSH 4.150 04/21/2019   TSH 6.39 (H) 01/22/2019    Therapeutic Level Labs: No results found for: LITHIUM Lab Results  Component Value Date   VALPROATE 60.0 08/18/2020   VALPROATE 74.5 01/26/2019   No components found for:  CBMZ  Current Medications: Current Outpatient Medications  Medication Sig Dispense Refill  . amphetamine-dextroamphetamine (ADDERALL XR) 20 MG 24 hr capsule Take 2 capsules (40 mg total) by mouth every morning. 60 capsule 0  . amphetamine-dextroamphetamine (ADDERALL) 10 MG tablet Take 1 tablet (10 mg total) by mouth daily. 90 tablet 0  . albuterol (PROVENTIL HFA;VENTOLIN HFA) 108 (90 Base) MCG/ACT inhaler Inhale 2 puffs into the lungs every 6 (six) hours as needed for wheezing or shortness of breath. 1 Inhaler 0  . amphetamine-dextroamphetamine (ADDERALL XR) 20 MG 24 hr capsule Take 2 capsules (40 mg total) by mouth every morning. 60 capsule 0  . amphetamine-dextroamphetamine (ADDERALL) 10 MG tablet Take 1 tablet (10 mg total) by mouth daily. 30 tablet 0  . buPROPion (WELLBUTRIN XL) 150 MG 24 hr tablet 450 mg daily. Take along with 300 mg tab 30 tablet 2  . buPROPion (WELLBUTRIN XL) 300 MG 24 hr tablet Take 1 tablet (300 mg total) by mouth every morning. 90 tablet 2  . cariprazine (VRAYLAR) 1.5 MG capsule Take 1 capsule (1.5 mg total) by mouth daily. 30 capsule 2  . clindamycin-benzoyl peroxide (BENZACLIN) gel APPLY ON THE SKIN DAILY TO FACE AT NIGHT  3  . clonazePAM  (KLONOPIN) 0.5 MG tablet Take 1 tablet (0.5 mg total) by mouth 2 (two) times daily as needed for anxiety. 60 tablet 2  . desloratadine (CLARINEX) 5 MG tablet Take 5 mg by mouth daily.    . divalproex (DEPAKOTE ER) 500 MG 24 hr tablet TAKE TWO TABLETS BY MOUTH EVERY NIGHT AT BEDTIME 60 tablet 2  . fluticasone (FLONASE) 50 MCG/ACT nasal spray Place 2 sprays into both nostrils daily. 16 g 4  . gabapentin (NEURONTIN) 300 MG capsule Take 1 capsule (300 mg total) by mouth 3 (three) times daily. 270 capsule 2  . levothyroxine (SYNTHROID) 50 MCG tablet Take 1 tablet (50 mcg total) by mouth daily before breakfast.  90 tablet 3  . montelukast (SINGULAIR) 10 MG tablet Take 10 mg by mouth at bedtime.    . naproxen (NAPROSYN) 500 MG tablet TAKE 1 TAB BY MOUTH 2 (TWO) TIMES DAILY WITH A MEAL. AS NEEDED FOR MENSTRUAL CRAMPING. 30 tablet 0  . norgestrel-ethinyl estradiol (LOW-OGESTREL) 0.3-30 MG-MCG tablet TAKE ONE TABLET BY MOUTH DAILY CONTINUOUSLY SKIP SUGAR PILL WEEK 28 tablet 3  . Olopatadine HCl 0.6 % SOLN U 2 SPRAYS IEN BID  1  . ondansetron (ZOFRAN) 4 MG tablet Take 1 tablet (4 mg total) by mouth every 8 (eight) hours as needed for nausea or vomiting. 30 tablet 1  . spironolactone (ALDACTONE) 25 MG tablet TK 1 T PO BID  2  . traZODone (DESYREL) 100 MG tablet Take 1 tablet (100 mg total) by mouth at bedtime. 90 tablet 2   No current facility-administered medications for this visit.     Musculoskeletal: Strength & Muscle Tone: within normal limits Gait & Station: normal Patient leans: N/A  Psychiatric Specialty Exam: Review of Systems  All other systems reviewed and are negative.   There were no vitals taken for this visit.There is no height or weight on file to calculate BMI.  General Appearance: Casual and Fairly Groomed  Eye Contact:  Good  Speech:  Clear and Coherent  Volume:  Normal  Mood:  Euthymic  Affect:  Appropriate and Congruent  Thought Process:  Goal Directed  Orientation:  Full  (Time, Place, and Person)  Thought Content: Rumination   Suicidal Thoughts:  No  Homicidal Thoughts:  No  Memory:  Immediate;   Good Recent;   Good Remote;   Good  Judgement:  Good  Insight:  Fair  Psychomotor Activity:  Normal  Concentration:  Concentration: Good and Attention Span: Good  Recall:  Good  Fund of Knowledge: Good  Language: Good  Akathisia:  No  Handed:  Right  AIMS (if indicated): not done  Assets:  Communication Skills Desire for Improvement Physical Health Resilience Social Support Talents/Skills  ADL's:  Intact  Cognition: WNL  Sleep:  Good   Screenings: GAD-7   Flowsheet Row Office Visit from 12/20/2019 in [facility] Office Visit from 04/21/2019 in [facility]  Total GAD-7 Score 3 2    PHQ2-9   Flowsheet Row [facility] Video Visit from 08/23/2020 in [facility] Counselor from 08[facility]21/2020 in [facility] Video Visit from 08/08/2020 in [facility] Office Visit from 12/20/2019 in [facility]  PHQ-2 Total Score 0 6 4 3 3   PHQ-9 Total Score 0 14 17 18 5     Flowsheet Row Video Visit from 09/28/2020 in [facility] Video Visit from 08/23/2020 in [facility] Counselor from 08/21/2020 [facility]  [facility]-SSRS RISK CATEGORY Error: Question 6 not populated Error: Q7 should not be populated when Q6 is No Low Risk       Assessment and Plan: This patient is a 25 year old female with a history of depression anger irritability consistent with bipolar disorder mixed state.  She seems to be doing better since we added Vraylar  1.5 mg to target the bipolar depression.  She will continue this as well as Wellbutrin XL 450 mg daily for depression, clonazepam 0.5 mg twice daily as needed  for anxiety, Depakote ER 1000 mg at bedtime for mood stabilization, gabapentin 300 mg 3 times daily for anxiety, trazodone 100 mg at bedtime for sleep.  She will return to see me in 2 months   Diannia Ruder, MD 09/28/2020, 2:52 PM

## 2020-10-06 ENCOUNTER — Other Ambulatory Visit: Payer: Self-pay

## 2020-10-06 ENCOUNTER — Other Ambulatory Visit (INDEPENDENT_AMBULATORY_CARE_PROVIDER_SITE_OTHER): Payer: Medicaid Other

## 2020-10-06 DIAGNOSIS — E039 Hypothyroidism, unspecified: Secondary | ICD-10-CM

## 2020-10-06 LAB — T4, FREE: Free T4: 0.87 ng/dL (ref 0.60–1.60)

## 2020-10-06 LAB — TSH: TSH: 3.04 u[IU]/mL (ref 0.35–4.50)

## 2021-01-01 ENCOUNTER — Telehealth (HOSPITAL_COMMUNITY): Payer: Self-pay | Admitting: *Deleted

## 2021-01-01 NOTE — Telephone Encounter (Signed)
Patient pharmacy called requesting PA for patient Adderall XR. Patient called requesting the same thing.   Staff previously called patient insurance [employer] to get PA completed and they informed staff that PA not needed and all pharmacy had to do was enter in their override code to get the medication to go through. Insurance also stated that pharmacy also can run the claims with them for generic version for a paid claim. Pharmacy was informed by staff and they verbalized understanding and stated that it went through.   Provider later informed staff that patient and pt mother informed her during a virtual visit that patient PA for her Adderall XR is still not done and to try working on it.   Staff called patient insurance again just to make sure as to why the pharmacy is still needing PA.   Per [facility], pharmacy do not need a PA. Pharmacy can either run claims with them for generic Adderall XR for a paid claim (which was what another person had informed staff from previous call) or pharmacy need to put in DUR override code at the point of sales. Insurance also informed staff that on their end, they can see that there was a paid claim that already went through for this month (August). Staff asked insurance if they could see if a PA is needed for next month (September) and further. Per insurance they don't see that a PA is needed. Insurance also informed staff that a Production designer, theatre/television/film informed her (while staff was on hold for them to do a further investigation as to why pharmacy is getting a rejection code 61) that they tried to run through the system to see if medication needed PA but the rejection code 88 is needing to be removed by the pharmacy. Per [employer], the insurance is telling them pt don't need PA but they don't know if the rejection code is keeping them for forcing PA from processing or not. Insurance suggest that pharmacy put in their specific pharmacy override code to remove the rejection code  or pharmacy can just request generic for a paid claim. Insurance also stated if message on pharmacy side is stating notes needs to be provided then office might have to release the reason for going over quantity limit. Insurance then stated if that on their end it just looks like a point of sale rejection and they need to put in their override code.   Staff called patient mother and informed her with all of information from the insurance and provided insurance number to her as well [phone]) and she stated she will let patient know all of the information provided to her. Staff called patient and was not able to reach her (mother stated patient was at work) and staff detailed message on patient voicemail because patient stated her full name on voicemail.

## 2021-03-16 ENCOUNTER — Ambulatory Visit (INDEPENDENT_AMBULATORY_CARE_PROVIDER_SITE_OTHER): Payer: Medicaid Other | Admitting: Endocrinology

## 2021-03-16 ENCOUNTER — Other Ambulatory Visit: Payer: Self-pay

## 2021-03-16 VITALS — BP 130/80 | HR 109 | Ht 59.0 in | Wt 160.3 lb

## 2021-03-16 DIAGNOSIS — E039 Hypothyroidism, unspecified: Secondary | ICD-10-CM | POA: Diagnosis not present

## 2021-03-16 DIAGNOSIS — G90A Postural orthostatic tachycardia syndrome (POTS): Secondary | ICD-10-CM | POA: Diagnosis not present

## 2021-03-16 NOTE — Patient Instructions (Addendum)
Blood tests are requested for you today.  We'll let you know about the results.   Please come back for a follow-up appointment in 6 months.  Please call sooner if a pregnancy happens.

## 2021-03-16 NOTE — Progress Notes (Signed)
Subjective:    Patient ID: Samantha Tucker, female    DOB: [birth year], 25 y.o.   MRN: [id number]  HPI Pt returns for f/u of hypothyroidism (dx'ed early 2019; she was rx'ed low-dosage synthroid; she has never had thyroid imaging; she is not at risk for pregnancy).  pt states she feels well in general.  She denies neck swelling.  Pt verifies synthroid is 50/d.  Pt says she never misses the synthroid.  She has reg menses.  Pt says she is not at risk for pregnancy.   Past Medical History:  Diagnosis Date   ADHD (attention deficit hyperactivity disorder)    Allergy    Anxiety    Constipation    Environmental allergies    Nausea    Postural hypotension     Past Surgical History:  Procedure Laterality Date   TYMPANOSTOMY TUBE PLACEMENT Bilateral [id number]    Social History   Socioeconomic History   Marital status: Single    Spouse name: Not on file   Number of children: Not on file   Years of education: Not on file   Highest education level: Not on file  Occupational History   Not on file  Tobacco Use   Smoking status: Never   Smokeless tobacco: Never  Substance and Sexual Activity   Alcohol use: No   Drug use: No   Sexual activity: Never  Other Topics Concern   Not on file  Social History Narrative   Not on file   Social Determinants of Health   Financial Resource Strain: Not on file  Food Insecurity: Not on file  Transportation Needs: Not on file  Physical Activity: Not on file  Stress: Not on file  Social Connections: Not on file  Intimate Partner Violence: Not on file    Current Outpatient Medications on File Prior to Visit  Medication Sig Dispense Refill   albuterol (PROVENTIL HFA;VENTOLIN HFA) 108 (90 Base) MCG/ACT inhaler Inhale 2 puffs into the lungs every 6 (six) hours as needed for wheezing or shortness of breath. 1 Inhaler 0   amphetamine-dextroamphetamine (ADDERALL XR) 20 MG 24 hr capsule Take 2 capsules (40 mg total) by mouth every morning. 60 capsule 0    amphetamine-dextroamphetamine (ADDERALL XR) 20 MG 24 hr capsule Take 2 capsules (40 mg total) by mouth every morning. 60 capsule 0   amphetamine-dextroamphetamine (ADDERALL) 10 MG tablet Take 1 tablet (10 mg total) by mouth daily. 30 tablet 0   amphetamine-dextroamphetamine (ADDERALL) 10 MG tablet Take 1 tablet (10 mg total) by mouth daily. 90 tablet 0   buPROPion (WELLBUTRIN XL) 150 MG 24 hr tablet 450 mg daily. Take along with 300 mg tab 30 tablet 2   buPROPion (WELLBUTRIN XL) 300 MG 24 hr tablet Take 1 tablet (300 mg total) by mouth every morning. 90 tablet 2   cariprazine (VRAYLAR) 1.5 MG capsule Take 1 capsule (1.5 mg total) by mouth daily. 30 capsule 2   clindamycin-benzoyl peroxide (BENZACLIN) gel APPLY ON THE SKIN DAILY TO FACE AT NIGHT  3   clonazePAM (KLONOPIN) 0.5 MG tablet Take 1 tablet (0.5 mg total) by mouth 2 (two) times daily as needed for anxiety. 60 tablet 2   desloratadine (CLARINEX) 5 MG tablet Take 5 mg by mouth daily.     divalproex (DEPAKOTE ER) 500 MG 24 hr tablet TAKE TWO TABLETS BY MOUTH EVERY NIGHT AT BEDTIME 60 tablet 2   fluticasone (FLONASE) 50 MCG/ACT nasal spray Place 2 sprays into both nostrils daily. 16  g 4   gabapentin (NEURONTIN) 300 MG capsule Take 1 capsule (300 mg total) by mouth 3 (three) times daily. 270 capsule 2   montelukast (SINGULAIR) 10 MG tablet Take 10 mg by mouth at bedtime.     naproxen (NAPROSYN) 500 MG tablet TAKE 1 TAB BY MOUTH 2 (TWO) TIMES DAILY WITH A MEAL. AS NEEDED FOR MENSTRUAL CRAMPING. 30 tablet 0   norgestrel-ethinyl estradiol (LOW-OGESTREL) 0.3-30 MG-MCG tablet TAKE ONE TABLET BY MOUTH DAILY CONTINUOUSLY SKIP SUGAR PILL WEEK 28 tablet 3   Olopatadine HCl 0.6 % SOLN U 2 SPRAYS IEN BID  1   ondansetron (ZOFRAN) 4 MG tablet Take 1 tablet (4 mg total) by mouth every 8 (eight) hours as needed for nausea or vomiting. 30 tablet 1   spironolactone (ALDACTONE) 25 MG tablet TK 1 T PO BID  2   traZODone (DESYREL) 100 MG tablet Take 1 tablet (100  mg total) by mouth at bedtime. 90 tablet 2   No current facility-administered medications on file prior to visit.    Allergies  Allergen Reactions   Other Hives and Shortness Of Breath    Develops hives with exposure to cold air.    Claritin [Loratadine]     Fainting spells   Sulfa Antibiotics    Latex Hives    Family History  Problem Relation Age of Onset   Bipolar disorder Father    Bipolar disorder Sister    Bipolar disorder Brother    Thyroid disease Brother    Bipolar disorder Paternal Uncle    Alcohol abuse Paternal Uncle    Bipolar disorder Maternal Grandmother    Bipolar disorder Paternal Grandfather    Alcohol abuse Paternal Grandfather    Bipolar disorder Paternal Grandmother    Bipolar disorder Brother    Bipolar disorder Brother     BP 130/80 (BP Location: Right Arm, Patient Position: Sitting, Cuff Size: Normal)   Pulse (!) 109   Ht 4\' 11"  (1.499 m)   Wt 160 lb 4.8 oz (72.7 kg)   SpO2 98%   BMI 32.38 kg/m    Review of Systems Denies dizziness and LOC    Objective:   Physical Exam NECK: There is no palpable thyroid enlargement.  No thyroid nodule is palpable.  No palpable lymphadenopathy at the anterior neck.   Lab Results  Component Value Date   TSH 4.900 (H) 03/16/2021   T3TOTAL 157 08/22/2017   T4TOTAL 6.6 05/28/2013      Assessment & Plan:  Hypothyroidism: uncontrolled.  I have sent a prescription to your pharmacy, to increase synthroid

## 2021-03-17 LAB — TSH: TSH: 4.9 u[IU]/mL — ABNORMAL HIGH (ref 0.450–4.500)

## 2021-03-17 LAB — T4, FREE: Free T4: 1.26 ng/dL (ref 0.82–1.77)

## 2021-03-17 MED ORDER — LEVOTHYROXINE SODIUM 75 MCG PO TABS: 75.0000 ug | ORAL_TABLET | Freq: Every day | ORAL | 3 refills | Status: DC

## 2021-03-19 LAB — CORTISOL: Cortisol, Plasma: 11.5 ug/dL

## 2021-03-19 LAB — ACTH: C206 ACTH: 15 pg/mL (ref 6–50)

## 2021-04-30 ENCOUNTER — Telehealth (HOSPITAL_COMMUNITY): Payer: Self-pay | Admitting: *Deleted

## 2021-04-30 NOTE — Telephone Encounter (Signed)
Patient called and [facility] stating she is needing refills for her Adderall XR and Adderall short acting.   Staff called patient and informed her to please call her currently pharmacy and make sure they have both Adderall in stock due to national shortage. Then call office back and inform staff which pharmacy she would like for both refills to be sent to and f/u appt will be scheduled at that time.   Patient agreed and verbalized understanding and stated she will call office back with pharmacy of her choice.

## 2021-05-15 ENCOUNTER — Other Ambulatory Visit (HOSPITAL_COMMUNITY): Payer: Self-pay | Admitting: Psychiatry

## 2021-05-15 ENCOUNTER — Telehealth (HOSPITAL_COMMUNITY): Payer: Self-pay

## 2021-05-15 MED ORDER — AMPHETAMINE-DEXTROAMPHETAMINE 10 MG PO TABS: 10.0000 mg | ORAL_TABLET | Freq: Every day | ORAL | 0 refills | Status: DC

## 2021-05-15 MED ORDER — AMPHETAMINE-DEXTROAMPHET ER 20 MG PO CP24: 40.0000 mg | ORAL_CAPSULE | ORAL | 0 refills | Status: DC

## 2021-05-15 NOTE — Telephone Encounter (Signed)
Medication refill - Attempted to reach pt's Mother back, after she left a message pt was in need of both dosage refills for Adderall prescriptions.  Unable to leave a message as collateral's box was full.  Patient last seen 09/28/20 and no current appointment scheduled.

## 2021-05-15 NOTE — Telephone Encounter (Signed)
Per Provider Request  Meds sent, please call for appt.    Attempted call LVM to inform that med's have been sent & to request a call back to schedule appt to continue further refills

## 2021-05-15 NOTE — Telephone Encounter (Signed)
Meds sent, please call for appt

## 2021-05-21 ENCOUNTER — Encounter: Payer: Self-pay | Admitting: Family Medicine

## 2021-05-31 ENCOUNTER — Other Ambulatory Visit (HOSPITAL_COMMUNITY): Payer: Self-pay | Admitting: Psychiatry

## 2021-05-31 NOTE — Telephone Encounter (Signed)
Call for appt, not seen since May

## 2021-06-04 NOTE — Telephone Encounter (Signed)
LMOM

## 2021-06-12 ENCOUNTER — Other Ambulatory Visit: Payer: Self-pay | Admitting: Obstetrics

## 2021-06-12 DIAGNOSIS — Z3009 Encounter for other general counseling and advice on contraception: Secondary | ICD-10-CM

## 2021-06-12 NOTE — Telephone Encounter (Signed)
Called pt to follow up on pharmacy request for OCPs. VM left stating I am calling to follow up on prescription refill request. Explained we will need to speak with patient by phone or message prior to refilling. Callback number given.

## 2021-07-04 ENCOUNTER — Telehealth (HOSPITAL_COMMUNITY): Payer: Self-pay | Admitting: Psychiatry

## 2021-07-04 NOTE — Telephone Encounter (Signed)
Called to schedule f/u almost 40yr since an appt, advised can can cause an interruption in getting future meds if not appt is made

## 2021-07-05 ENCOUNTER — Ambulatory Visit: Payer: Medicaid Other | Admitting: Obstetrics and Gynecology

## 2021-07-05 ENCOUNTER — Ambulatory Visit (INDEPENDENT_AMBULATORY_CARE_PROVIDER_SITE_OTHER): Payer: Medicaid Other

## 2021-07-05 ENCOUNTER — Other Ambulatory Visit: Payer: Self-pay

## 2021-07-05 VITALS — BP 115/85 | HR 115 | Wt 179.2 lb

## 2021-07-05 DIAGNOSIS — R635 Abnormal weight gain: Secondary | ICD-10-CM

## 2021-07-05 NOTE — Progress Notes (Signed)
Patient in for GYN visit. Would like to talk to PCOS, states this runs in family. Has noticed an increase in weight gain in the last year but in the last 3 months she has gained appx 60 pounds.    Wynona Canes, CMA

## 2021-07-05 NOTE — Progress Notes (Signed)
° °  GYNECOLOGY PROGRESS NOTE  History:  26 y.o. G0P0000 presents to [facility] office today for weight gain. Patient reports almost 80lb weight gain since July. She reports a history of hypothyroidism, currently on Synthroid, which is managed by her endocrinologist. She also has an extensive behavioral health history, currently on Depakote, Gabapentin, Adderall, Trazadone, and Wellbutrin. She reports that she eats a "well-balanced" diet, consisting of no fried foods, red meats, heavy pasta, or sugary drinks. She reports that she eats "a lot of vegetables". Snacks usually include poptarts. She reports that she does take OCP's, which she has no problems with. She reports that she exercises once weekly for approximately doing body weight exercises such as squats and sit ups. She and her mother are here today to be "tested for PCOS". She reports no periods as she is on a continuous OCP. She denies abdominal/pelvic pain, or fever/chills.    The following portions of the patient's history were reviewed and updated as appropriate: allergies, current medications, past family history, past medical history, past social history, past surgical history and problem list. Last pap smear on 04/2019 was normal.  Health Maintenance Due  Topic Date Due   HPV VACCINES (2 - 2-dose series) 06/08/2009   Hepatitis C Screening  Never done   TETANUS/TDAP  12/07/2018   COVID-19 Vaccine (2 - Booster for Janssen series) 09/18/2019   INFLUENZA VACCINE  12/11/2020     Review of Systems:  Pertinent items are noted in HPI.   Objective:  Physical Exam Blood pressure 115/85, pulse (!) 115, weight 179 lb 3.2 oz (81.3 kg). VS reviewed, nursing note reviewed,  Constitutional: well developed, well nourished, no distress HEENT: normocephalic CV: normal rate Pulm/chest wall: normal effort Breast Exam: deferred Abdomen: soft Neuro: alert and oriented x 3 Skin: warm, dry Psych: affect normal Pelvic exam: not  indicated  Assessment & Plan:  1. Weight gain - 19lb weight gain documented in chart from visit in November - Reviewed diet/exercise with patient. Encouraged 150 minutes of moderate exercise weekly. Walking and weight bearing exercises to start. Encourage more protein snacks compared to having Poptarts - Reviewed some medications with side effects known to cause weight gain  - Most recent labs checked by endocrinologist in November. TSH was elevated and Synthroid dose was adjusted. She has a f/u appointment in May which I recommend keeping that appointment - Currently on OCP's without issues - Will check A1C today    - HgB A1c  Follow up in 3 months or sooner as needed  Brand Males, CNM 07/05/21 12:07 PM

## 2021-07-06 LAB — HEMOGLOBIN A1C
Est. average glucose Bld gHb Est-mCnc: 105 mg/dL
Hgb A1c MFr Bld: 5.3 % (ref 4.8–5.6)

## 2021-07-09 MED ORDER — LOW-OGESTREL 0.3-30 MG-MCG PO TABS: ORAL_TABLET | ORAL | 11 refills | Status: DC

## 2021-07-09 NOTE — Addendum Note (Signed)
Addended by: Isabell Jarvis on: 07/09/2021 03:50 PM   Modules accepted: Orders

## 2021-07-30 ENCOUNTER — Other Ambulatory Visit (HOSPITAL_COMMUNITY): Payer: Self-pay | Admitting: Psychiatry

## 2021-07-30 NOTE — Telephone Encounter (Signed)
Call for appt, last seen May 2022

## 2021-08-02 NOTE — Telephone Encounter (Signed)
LMOM

## 2021-08-03 ENCOUNTER — Other Ambulatory Visit: Payer: Self-pay

## 2021-08-03 ENCOUNTER — Telehealth (INDEPENDENT_AMBULATORY_CARE_PROVIDER_SITE_OTHER): Payer: Medicaid Other | Admitting: Psychiatry

## 2021-08-03 ENCOUNTER — Encounter (HOSPITAL_COMMUNITY): Payer: Self-pay | Admitting: Psychiatry

## 2021-08-03 DIAGNOSIS — F3162 Bipolar disorder, current episode mixed, moderate: Secondary | ICD-10-CM | POA: Diagnosis not present

## 2021-08-03 DIAGNOSIS — F988 Other specified behavioral and emotional disorders with onset usually occurring in childhood and adolescence: Secondary | ICD-10-CM

## 2021-08-03 DIAGNOSIS — F331 Major depressive disorder, recurrent, moderate: Secondary | ICD-10-CM | POA: Diagnosis not present

## 2021-08-03 MED ORDER — AMPHETAMINE-DEXTROAMPHETAMINE 10 MG PO TABS: 10.0000 mg | ORAL_TABLET | Freq: Every day | ORAL | 0 refills | Status: DC

## 2021-08-03 MED ORDER — CLONAZEPAM 0.5 MG PO TABS: 0.5000 mg | ORAL_TABLET | Freq: Two times a day (BID) | ORAL | 2 refills | Status: DC | PRN

## 2021-08-03 MED ORDER — AMPHETAMINE-DEXTROAMPHET ER 20 MG PO CP24
40.0000 mg | ORAL_CAPSULE | ORAL | 0 refills | Status: DC
Start: 2021-08-03 — End: 2021-10-11

## 2021-08-03 MED ORDER — CARIPRAZINE HCL 1.5 MG PO CAPS
1.5000 mg | ORAL_CAPSULE | Freq: Every day | ORAL | 2 refills | Status: DC
Start: 2021-08-03 — End: 2021-12-17

## 2021-08-03 MED ORDER — BUPROPION HCL ER (XL) 150 MG PO TB24: ORAL_TABLET | ORAL | 2 refills | Status: DC

## 2021-08-03 MED ORDER — AMPHETAMINE-DEXTROAMPHET ER 20 MG PO CP24: 40.0000 mg | ORAL_CAPSULE | ORAL | 0 refills | Status: DC

## 2021-08-03 MED ORDER — DIVALPROEX SODIUM ER 500 MG PO TB24
ORAL_TABLET | ORAL | 2 refills | Status: DC
Start: 2021-08-03 — End: 2022-01-10

## 2021-08-03 MED ORDER — BUPROPION HCL ER (XL) 300 MG PO TB24: 300.0000 mg | ORAL_TABLET | ORAL | 2 refills | Status: DC

## 2021-08-03 MED ORDER — GABAPENTIN 300 MG PO CAPS: 300.0000 mg | ORAL_CAPSULE | Freq: Three times a day (TID) | ORAL | 2 refills | Status: DC

## 2021-08-03 NOTE — Progress Notes (Signed)
Virtual Visit via Video Note ? ?I connected with Samantha Tucker on 08/03/21 at 11:20 AM EDT by a video enabled telemedicine application and verified that I am speaking with the correct person using two identifiers. ? ?Location: ?Patient: home ?Provider: home office ?  ?I discussed the limitations of evaluation and management by telemedicine and the availability of in person appointments. The patient expressed understanding and agreed to proceed. ? ? ?  ?I discussed the assessment and treatment plan with the patient. The patient was provided an opportunity to ask questions and all were answered. The patient agreed with the plan and demonstrated an understanding of the instructions. ?  ?The patient was advised to call back or seek an in-person evaluation if the symptoms worsen or if the condition fails to improve as anticipated. ? ?I provided 12 minutes of non-face-to-face time during this encounter. ? ? ?Diannia Rudereborah Kolina Kube, MD ? ?BH MD/PA/NP OP Progress Note ? ?08/03/2021 11:33 AM ?Samantha Tucker  ?MRN:  [id number] ? ?Chief Complaint:  ?Chief Complaint  ?Patient presents with  ? Anxiety  ? Depression  ? Follow-up  ? ?HPI: This patient is a 26 year-old single white female who lives [place] in [place].  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 ?The patient returns after long absence.  She was last seen about 10 months ago.  Overall she states she is doing well.  Her thyroid was a little bit low at her last visit in November but her endocrinologist has increased the Synthroid.  She denies any fatigue.  She states that she is doing well at both work and school.  She is enjoying her courses and getting good grades.  She denies significant depression anxiety mood swings anger irritability.  She denies thoughts of suicide or self-harm.  She presents is very happy and upbeat. ?Visit Diagnosis:  ?  ICD-10-CM   ?1. Major depressive disorder, recurrent episode, moderate (HCC)  F33.1   ?  ?2.  ADD (attention deficit disorder) without hyperactivity  F98.8   ?  ?3. Moderate mixed bipolar I disorder (HCC)  F31.62   ?  ? ? ?Past Psychiatric History: Long-term outpatient treatment ? ?Past Medical History:  ?Past Medical History:  ?Diagnosis Date  ? ADHD (attention deficit hyperactivity disorder)   ? Allergy   ? Anxiety   ? Constipation   ? Environmental allergies   ? Nausea   ? Postural hypotension   ?  ?Past Surgical History:  ?Procedure Laterality Date  ? TYMPANOSTOMY TUBE PLACEMENT Bilateral [id number]  ? ? ?Family Psychiatric History: see below ? ?Family History:  ?Family History  ?Problem Relation Age of Onset  ? Bipolar disorder Father   ? Bipolar disorder Sister   ? Bipolar disorder Brother   ? Thyroid disease Brother   ? Bipolar disorder Paternal Uncle   ? Alcohol abuse Paternal Uncle   ? Bipolar disorder Maternal Grandmother   ? Bipolar disorder Paternal Grandfather   ? Alcohol abuse Paternal Grandfather   ? Bipolar disorder Paternal Grandmother   ? Bipolar disorder Brother   ? Bipolar disorder Brother   ? ? ?Social History:  ?Social History  ? ?Socioeconomic History  ? Marital status: Single  ?  Spouse name: Not on file  ? Number of children: Not on file  ? Years of education: Not on file  ? Highest education level: Not on file  ?Occupational History  ? Not on file  ?  Tobacco Use  ? Smoking status: Never  ? Smokeless tobacco: Never  ?Substance and Sexual Activity  ? Alcohol use: No  ? Drug use: No  ? Sexual activity: Never  ?Other Topics Concern  ? Not on file  ?Social History Narrative  ? Not on file  ? ?Social Determinants of Health  ? ?Financial Resource Strain: Not on file  ?Food Insecurity: Not on file  ?Transportation Needs: Not on file  ?Physical Activity: Not on file  ?Stress: Not on file  ?Social Connections: Not on file  ? ? ?Allergies:  ?Allergies  ?Allergen Reactions  ? Other Hives and Shortness Of Breath  ?  Develops hives with exposure to cold air.   ? Claritin [Loratadine]   ?  Fainting  spells  ? Sulfa Antibiotics   ? Latex Hives  ? ? ?Metabolic Disorder Labs: ?Lab Results  ?Component Value Date  ? HGBA1C 5.3 07/05/2021  ? ?No results found for: PROLACTIN ?No results found for: CHOL, TRIG, HDL, CHOLHDL, VLDL, LDLCALC ?Lab Results  ?Component Value Date  ? TSH 4.900 (H) 03/16/2021  ? TSH 3.04 10/06/2020  ? ? ?Therapeutic Level Labs: ?No results found for: LITHIUM ?Lab Results  ?Component Value Date  ? VALPROATE 60.0 08/18/2020  ? VALPROATE 74.5 01/26/2019  ? ?No components found for:  CBMZ ? ?Current Medications: ?Current Outpatient Medications  ?Medication Sig Dispense Refill  ? albuterol (PROVENTIL HFA;VENTOLIN HFA) 108 (90 Base) MCG/ACT inhaler Inhale 2 puffs into the lungs every 6 (six) hours as needed for wheezing or shortness of breath. 1 Inhaler 0  ? amphetamine-dextroamphetamine (ADDERALL XR) 20 MG 24 hr capsule Take 2 capsules (40 mg total) by mouth every morning. 60 capsule 0  ? amphetamine-dextroamphetamine (ADDERALL XR) 20 MG 24 hr capsule Take 2 capsules (40 mg total) by mouth every morning. 60 capsule 0  ? amphetamine-dextroamphetamine (ADDERALL) 10 MG tablet Take 1 tablet (10 mg total) by mouth daily. 30 tablet 0  ? amphetamine-dextroamphetamine (ADDERALL) 10 MG tablet Take 1 tablet (10 mg total) by mouth daily. 90 tablet 0  ? buPROPion (WELLBUTRIN XL) 150 MG 24 hr tablet 450 mg daily. Take along with 300 mg tab 30 tablet 2  ? buPROPion (WELLBUTRIN XL) 300 MG 24 hr tablet Take 1 tablet (300 mg total) by mouth every morning. 90 tablet 2  ? clindamycin-benzoyl peroxide (BENZACLIN) gel APPLY ON THE SKIN DAILY TO FACE AT NIGHT (Patient not taking: Reported on 07/05/2021)  3  ? clonazePAM (KLONOPIN) 0.5 MG tablet Take 1 tablet (0.5 mg total) by mouth 2 (two) times daily as needed for anxiety. 60 tablet 2  ? desloratadine (CLARINEX) 5 MG tablet Take 5 mg by mouth daily.    ? divalproex (DEPAKOTE ER) 500 MG 24 hr tablet TAKE TWO TABLETS BY MOUTH EVERY NIGHT AT BEDTIME 60 tablet 2  ?  fluticasone (FLONASE) 50 MCG/ACT nasal spray Place 2 sprays into both nostrils daily. 16 g 4  ? gabapentin (NEURONTIN) 300 MG capsule Take 1 capsule (300 mg total) by mouth 3 (three) times daily. 270 capsule 2  ? levothyroxine (SYNTHROID) 75 MCG tablet Take 1 tablet (75 mcg total) by mouth daily. 90 tablet 3  ? montelukast (SINGULAIR) 10 MG tablet Take 10 mg by mouth at bedtime.    ? naproxen (NAPROSYN) 500 MG tablet TAKE 1 TAB BY MOUTH 2 (TWO) TIMES DAILY WITH A MEAL. AS NEEDED FOR MENSTRUAL CRAMPING. 30 tablet 0  ? norgestrel-ethinyl estradiol (LOW-OGESTREL) 0.3-30 MG-MCG tablet TAKE ONE TABLET BY  MOUTH DAILY CONTINUOUSLY SKIP SUGAR PILL WEEK 28 tablet 11  ? Olopatadine HCl 0.6 % SOLN U 2 SPRAYS IEN BID  1  ? ondansetron (ZOFRAN) 4 MG tablet Take 1 tablet (4 mg total) by mouth every 8 (eight) hours as needed for nausea or vomiting. 30 tablet 1  ? spironolactone (ALDACTONE) 25 MG tablet TK 1 T PO BID  2  ? traZODone (DESYREL) 100 MG tablet TAKE ONE TABLET BY MOUTH EVERY NIGHT AT BEDTIME 90 tablet 2  ? VRAYLAR 1.5 MG capsule TAKE ONE CAPSULE BY MOUTH DAILY 30 capsule 2  ? ?No current facility-administered medications for this visit.  ? ? ? ?Musculoskeletal: ?Strength & Muscle Tone: within normal limits ?Gait & Station: normal ?Patient leans: N/A ? ?Psychiatric Specialty Exam: ?Review of Systems  ?All other systems reviewed and are negative.  ?There were no vitals taken for this visit.There is no height or weight on file to calculate BMI.  ?General Appearance: Casual and Fairly Groomed  ?Eye Contact:  Good  ?Speech:  Clear and Coherent  ?Volume:  Normal  ?Mood:  Euthymic  ?Affect:  Appropriate and Congruent  ?Thought Process:  Goal Directed  ?Orientation:  Full (Time, Place, and Person)  ?Thought Content: WDL   ?Suicidal Thoughts:  No  ?Homicidal Thoughts:  No  ?Memory:  Immediate;   Good ?Recent;   Good ?Remote;   Good  ?Judgement:  Good  ?Insight:  Good  ?Psychomotor Activity:  Normal  ?Concentration:   Concentration: Good and Attention Span: Good  ?Recall:  Good  ?Fund of Knowledge: Good  ?Language: Good  ?Akathisia:  No  ?Handed:  Right  ?AIMS (if indicated): not done  ?Assets:  Communication Skills ?Desire for Improvemen

## 2021-08-09 ENCOUNTER — Other Ambulatory Visit (HOSPITAL_COMMUNITY): Payer: Self-pay | Admitting: Psychiatry

## 2021-08-09 ENCOUNTER — Telehealth (HOSPITAL_COMMUNITY): Payer: Self-pay | Admitting: *Deleted

## 2021-08-09 MED ORDER — AMPHETAMINE-DEXTROAMPHET ER 20 MG PO CP24: 40.0000 mg | ORAL_CAPSULE | ORAL | 0 refills | Status: DC

## 2021-08-09 NOTE — Telephone Encounter (Signed)
Patient pharmacy called stating they would like another script for Patient Adderall XR 20. They have the wrong quantity and the Sig is saying something else.  ? ? ?[phone] ?

## 2021-08-09 NOTE — Telephone Encounter (Signed)
sent 

## 2021-09-14 ENCOUNTER — Ambulatory Visit: Payer: Medicaid Other | Admitting: Endocrinology

## 2021-09-20 ENCOUNTER — Ambulatory Visit: Payer: Medicaid Other | Admitting: Endocrinology

## 2021-10-11 ENCOUNTER — Encounter: Payer: Self-pay | Admitting: Endocrinology

## 2021-10-11 ENCOUNTER — Ambulatory Visit: Payer: Medicaid Other | Admitting: Endocrinology

## 2021-10-11 VITALS — BP 118/82 | HR 105 | Ht 59.0 in | Wt 197.2 lb

## 2021-10-11 DIAGNOSIS — Z6839 Body mass index (BMI) 39.0-39.9, adult: Secondary | ICD-10-CM

## 2021-10-11 DIAGNOSIS — E282 Polycystic ovarian syndrome: Secondary | ICD-10-CM

## 2021-10-11 DIAGNOSIS — R635 Abnormal weight gain: Secondary | ICD-10-CM | POA: Diagnosis not present

## 2021-10-11 DIAGNOSIS — E039 Hypothyroidism, unspecified: Secondary | ICD-10-CM

## 2021-10-11 NOTE — Progress Notes (Unsigned)
Patient ID: Samantha Tucker, female   DOB: [birth year], 26 y.o.   MRN: [id number]            Reason for Appointment:  Hypothyroidism, new visit    History of Present Illness:   Hypothyroidism was first diagnosed in 2019  Unclear what symptoms she was having at that time and likely had mostly fatigue She was referred to endocrinology because of mildly increased TSH of about 4.9 However she started with starting levothyroxine 25 mcg daily she had improved energy level, also was having some menstrual irregularities at that time with heavier bleeding  Subsequently her dose was increased to 50 mcg in 01/2019 and likely she had some fatigue at that time  She was continued on the same dose till about 11/22 and although she did not remember any symptoms of fatigue her dose was increased to 75 mcg She has not followed up until today Recently she feels fairly well with her energy level except for significant weight gain in the last year or so  The patient takes the thyroid supplement before breakfast         Patient's weight history is as follows:  Wt Readings from Last 3 Encounters:  10/11/21 197 lb 3.2 oz (89.4 kg)  07/05/21 179 lb 3.2 oz (81.3 kg)  03/16/21 160 lb 4.8 oz (72.7 kg)   HIGHEST TSH on record is 6.4 as of 2020  Thyroid function results have been as follows:  Lab Results  Component Value Date   TSH 4.900 (H) 03/16/2021   TSH 3.04 10/06/2020   TSH 4.150 04/21/2019   TSH 6.39 (H) 01/22/2019   FREET4 1.26 03/16/2021   FREET4 0.87 10/06/2020   FREET4 0.88 01/22/2019   FREET4 0.73 07/20/2018    WEIGHT gain:  She is concerned about progressive weight gain for at least a year and appears to have gained almost 70 pounds since April 2022 Her mother thinks that she is trying to watch her diet carefully and trying to exercise without any benefit She has not had any easy bruising or muscle weakness Cortisol and ACTH levels were normal in 11/22  She has had history of irregular  menstrual cycles prior to going on birth control pill For at least over a year or so she has had mild facial hair which she does not normally remove Also for the last few years has had severe acne and decide responded to spironolactone from her dermatologist  Wt Readings from Last 3 Encounters:  10/11/21 197 lb 3.2 oz (89.4 kg)  07/05/21 179 lb 3.2 oz (81.3 kg)  03/16/21 160 lb 4.8 oz (72.7 kg)     Past Medical History:  Diagnosis Date   ADHD (attention deficit hyperactivity disorder)    Allergy    Anxiety    Constipation    Environmental allergies    Nausea    Postural hypotension     Past Surgical History:  Procedure Laterality Date   TYMPANOSTOMY TUBE PLACEMENT Bilateral [id number]    Family History  Problem Relation Age of Onset   Bipolar disorder Father    Bipolar disorder Sister    Bipolar disorder Brother    Thyroid disease Brother    Bipolar disorder Paternal Uncle    Alcohol abuse Paternal Uncle    Bipolar disorder Maternal Grandmother    Bipolar disorder Paternal Grandfather    Alcohol abuse Paternal Grandfather    Bipolar disorder Paternal Grandmother    Bipolar disorder Brother    Bipolar disorder  Brother     Social History:  reports that she has never smoked. She has never used smokeless tobacco. She reports that she does not drink alcohol and does not use drugs.  Allergies:  Allergies  Allergen Reactions   Other Hives and Shortness Of Breath    Develops hives with exposure to cold air.    Claritin [Loratadine]     Fainting spells   Sulfa Antibiotics    Latex Hives    Allergies as of 10/11/2021       Reactions   Other Hives, Shortness Of Breath   Develops hives with exposure to cold air.    Claritin [loratadine]    Fainting spells   Sulfa Antibiotics    Latex Hives        Medication List        Accurate as of October 11, 2021  2:26 PM. If you have any questions, ask your nurse or doctor.          albuterol 108 (90 Base) MCG/ACT  inhaler Commonly known as: VENTOLIN HFA Inhale 2 puffs into the lungs every 6 (six) hours as needed for wheezing or shortness of breath.   amphetamine-dextroamphetamine 20 MG 24 hr capsule Commonly known as: Adderall XR Take 2 capsules (40 mg total) by mouth every morning. What changed: Another medication with the same name was removed. Continue taking this medication, and follow the directions you see here.   amphetamine-dextroamphetamine 10 MG tablet Commonly known as: Adderall Take 1 tablet (10 mg total) by mouth daily. Fill after 09/02/2021 What changed: Another medication with the same name was removed. Continue taking this medication, and follow the directions you see here.   buPROPion 300 MG 24 hr tablet Commonly known as: WELLBUTRIN XL Take 1 tablet (300 mg total) by mouth every morning.   buPROPion 150 MG 24 hr tablet Commonly known as: Wellbutrin XL 450 mg daily. Take along with 300 mg tab   cariprazine 1.5 MG capsule Commonly known as: Vraylar Take 1 capsule (1.5 mg total) by mouth daily.   clindamycin-benzoyl peroxide gel Commonly known as: BENZACLIN   clonazePAM 0.5 MG tablet Commonly known as: KLONOPIN Take 1 tablet (0.5 mg total) by mouth 2 (two) times daily as needed for anxiety.   desloratadine 5 MG tablet Commonly known as: CLARINEX Take 5 mg by mouth daily.   divalproex 500 MG 24 hr tablet Commonly known as: DEPAKOTE ER TAKE TWO TABLETS BY MOUTH EVERY NIGHT AT BEDTIME   fluticasone 50 MCG/ACT nasal spray Commonly known as: FLONASE Place 2 sprays into both nostrils daily.   gabapentin 300 MG capsule Commonly known as: Neurontin Take 1 capsule (300 mg total) by mouth 3 (three) times daily.   levothyroxine 75 MCG tablet Commonly known as: SYNTHROID Take 1 tablet (75 mcg total) by mouth daily.   Low-Ogestrel 0.3-30 MG-MCG tablet Generic drug: norgestrel-ethinyl estradiol TAKE ONE TABLET BY MOUTH DAILY CONTINUOUSLY SKIP SUGAR PILL WEEK    montelukast 10 MG tablet Commonly known as: SINGULAIR Take 10 mg by mouth at bedtime.   naproxen 500 MG tablet Commonly known as: NAPROSYN TAKE 1 TAB BY MOUTH 2 (TWO) TIMES DAILY WITH A MEAL. AS NEEDED FOR MENSTRUAL CRAMPING.   Olopatadine HCl 0.6 % Soln U 2 SPRAYS IEN BID   ondansetron 4 MG tablet Commonly known as: ZOFRAN Take 1 tablet (4 mg total) by mouth every 8 (eight) hours as needed for nausea or vomiting.   spironolactone 25 MG tablet Commonly known as: ALDACTONE  TK 1 T PO BID   traZODone 100 MG tablet Commonly known as: DESYREL TAKE ONE TABLET BY MOUTH EVERY NIGHT AT BEDTIME           Review of Systems  Skin:        Acne. Facial hair 2022               Examination:    BP 118/82   Pulse (!) 105   Ht 4\' 11"  (1.499 m)   Wt 197 lb 3.2 oz (89.4 kg)   SpO2 99%   BMI 39.83 kg/m   GENERAL:  Average build.   No pallor.    Skin:  no rash or significant skin lesions.  EYES:  No prominence of the eyes or swelling of the eyelids  ENT: Oral mucosa and tongue normal.  NECK: No lymphadenopathy  THYROID:  Not palpable.  HEART:  Normal  S1 and S2; no murmur or click.  CHEST:    Lungs: Vescicular breath sounds heard equally.  No crepitations/ wheeze.  ABDOMEN:  No distention.  Liver and spleen not palpable.  No other mass or tenderness.  NEUROLOGICAL: Reflexes are bilaterally at biceps.  EXTREMITIES:  Normal peripheral joints.  No ankle edema present   Assessment:  HYPOTHYROIDISM  PLAN:    Follow-up   Reather LittlerAjay Shavonte Zhao 10/11/2021, 2:26 PM   Consultation note copy sent to the PCP  Note: This office note was prepared with Dragon voice recognition system technology. Any transcriptional errors that result from this process are unintentional.

## 2021-10-17 ENCOUNTER — Other Ambulatory Visit (INDEPENDENT_AMBULATORY_CARE_PROVIDER_SITE_OTHER): Payer: Medicaid Other

## 2021-10-17 DIAGNOSIS — E282 Polycystic ovarian syndrome: Secondary | ICD-10-CM

## 2021-10-17 DIAGNOSIS — E039 Hypothyroidism, unspecified: Secondary | ICD-10-CM | POA: Diagnosis not present

## 2021-10-17 LAB — GLUCOSE, RANDOM: Glucose, Bld: 82 mg/dL (ref 70–99)

## 2021-10-17 LAB — T4, FREE: Free T4: 0.8 ng/dL (ref 0.60–1.60)

## 2021-10-17 LAB — TSH: TSH: 4.43 u[IU]/mL (ref 0.35–5.50)

## 2021-10-19 LAB — TESTOSTERONE, FREE, TOTAL, SHBG
Sex Hormone Binding: 26.4 nmol/L (ref 24.6–122.0)
Testosterone, Free: 1.5 pg/mL (ref 0.0–4.2)
Testosterone: 26 ng/dL (ref 13–71)

## 2021-10-19 LAB — PROLACTIN: Prolactin: 17.7 ng/mL (ref 4.8–23.3)

## 2021-10-19 NOTE — Progress Notes (Signed)
Please call to let patient know that the lab results including thyroid are normal.  She is not a candidate for metformin and unlikely she has PCOS.  She needs to ask her PCP to refer her to the weight management program at [facility] office if okay with her

## 2021-10-23 ENCOUNTER — Encounter: Payer: Self-pay | Admitting: Family Medicine

## 2021-12-17 ENCOUNTER — Other Ambulatory Visit (HOSPITAL_COMMUNITY): Payer: Self-pay | Admitting: Psychiatry

## 2021-12-17 NOTE — Telephone Encounter (Signed)
Call for appt

## 2021-12-18 ENCOUNTER — Telehealth (HOSPITAL_COMMUNITY): Payer: Self-pay | Admitting: *Deleted

## 2021-12-18 NOTE — Telephone Encounter (Signed)
LMOM for patient to call office to sch f/u appt with provider.

## 2021-12-26 ENCOUNTER — Telehealth (HOSPITAL_COMMUNITY): Payer: Medicaid Other | Admitting: Psychiatry

## 2022-01-10 ENCOUNTER — Telehealth (INDEPENDENT_AMBULATORY_CARE_PROVIDER_SITE_OTHER): Payer: Medicaid Other | Admitting: Psychiatry

## 2022-01-10 ENCOUNTER — Encounter (HOSPITAL_COMMUNITY): Payer: Self-pay | Admitting: Psychiatry

## 2022-01-10 DIAGNOSIS — F988 Other specified behavioral and emotional disorders with onset usually occurring in childhood and adolescence: Secondary | ICD-10-CM

## 2022-01-10 DIAGNOSIS — F909 Attention-deficit hyperactivity disorder, unspecified type: Secondary | ICD-10-CM

## 2022-01-10 DIAGNOSIS — F3162 Bipolar disorder, current episode mixed, moderate: Secondary | ICD-10-CM | POA: Diagnosis not present

## 2022-01-10 DIAGNOSIS — F331 Major depressive disorder, recurrent, moderate: Secondary | ICD-10-CM

## 2022-01-10 MED ORDER — AMPHETAMINE-DEXTROAMPHET ER 20 MG PO CP24: 40.0000 mg | ORAL_CAPSULE | ORAL | 0 refills | Status: DC

## 2022-01-10 MED ORDER — CLONAZEPAM 0.5 MG PO TABS: 0.5000 mg | ORAL_TABLET | Freq: Two times a day (BID) | ORAL | 2 refills | Status: DC | PRN

## 2022-01-10 MED ORDER — AMPHETAMINE-DEXTROAMPHETAMINE 10 MG PO TABS: 10.0000 mg | ORAL_TABLET | Freq: Every day | ORAL | 0 refills | Status: DC

## 2022-01-10 MED ORDER — TRAZODONE HCL 100 MG PO TABS: 100.0000 mg | ORAL_TABLET | Freq: Every day | ORAL | 2 refills | Status: DC

## 2022-01-10 MED ORDER — DIVALPROEX SODIUM ER 500 MG PO TB24: ORAL_TABLET | ORAL | 2 refills | Status: DC

## 2022-01-10 MED ORDER — CARIPRAZINE HCL 1.5 MG PO CAPS
1.5000 mg | ORAL_CAPSULE | Freq: Every day | ORAL | 2 refills | Status: DC
Start: 2022-01-10 — End: 2022-07-18

## 2022-01-10 MED ORDER — GABAPENTIN 300 MG PO CAPS: 300.0000 mg | ORAL_CAPSULE | Freq: Three times a day (TID) | ORAL | 2 refills | Status: DC

## 2022-01-10 MED ORDER — BUPROPION HCL ER (XL) 150 MG PO TB24: ORAL_TABLET | ORAL | 2 refills | Status: DC

## 2022-01-10 MED ORDER — BUPROPION HCL ER (XL) 300 MG PO TB24: 300.0000 mg | ORAL_TABLET | ORAL | 2 refills | Status: DC

## 2022-01-10 NOTE — Progress Notes (Signed)
Virtual Visit via Video Note  I connected with Samantha Tucker on 01/10/22 at  1:00 PM EDT by a video enabled telemedicine application and verified that I am speaking with the correct person using two identifiers.  Location: Patient: home Provider: office   I discussed the limitations of evaluation and management by telemedicine and the availability of in person appointments. The patient expressed understanding and agreed to proceed.     I discussed the assessment and treatment plan with the patient. The patient was provided an opportunity to ask questions and all were answered. The patient agreed with the plan and demonstrated an understanding of the instructions.   The patient was advised to call back or seek an in-person evaluation if the symptoms worsen or if the condition fails to improve as anticipated.  I provided 15 minutes of non-face-to-face time during this encounter.   Diannia Ruder, MD  [facility] MD/PA/NP OP Progress Note  01/10/2022 1:14 PM Glendene CAMERA KRIENKE  MRN:  [id number]  Chief Complaint:  Chief Complaint  Patient presents with   Depression   Anxiety   Follow-up   HPI: This patient is a 26 year-old single white female who lives with her family in [place].  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  She states that generally she has been doing well.  She and her mother had COVID 2 weeks ago but she came out of it okay.  Right now she states her mood is stable and she denies significant mood swings depression thoughts of self-harm or suicidal ideation.  Her energy is good.  She is enjoying her classes.  She denies significant anxiety.  She is sleeping well.  She is focusing well with the Adderall. Visit Diagnosis:    ICD-10-CM   1. Major depressive disorder, recurrent episode, moderate (HCC)  F33.1     2. ADD (attention deficit disorder) without hyperactivity  F98.8     3. Moderate mixed bipolar I disorder  (HCC)  F31.62       Past Psychiatric History: Long-term outpatient treatment  Past Medical History:  Past Medical History:  Diagnosis Date   ADHD (attention deficit hyperactivity disorder)    Allergy    Anxiety    Constipation    Environmental allergies    Nausea    Postural hypotension     Past Surgical History:  Procedure Laterality Date   TYMPANOSTOMY TUBE PLACEMENT Bilateral [id number]    Family Psychiatric History: See below  Family History:  Family History  Problem Relation Age of Onset   Diabetes Mother    Bipolar disorder Father    Bipolar disorder Sister    Bipolar disorder Brother    Thyroid disease Brother    Bipolar disorder Brother    Bipolar disorder Brother    Bipolar disorder Maternal Grandmother    Bipolar disorder Paternal Grandmother    Bipolar disorder Paternal Grandfather    Alcohol abuse Paternal Grandfather    Bipolar disorder Paternal Uncle    Alcohol abuse Paternal Uncle     Social History:  Social History   Socioeconomic History   Marital status: Single    Spouse name: Not on file   Number of children: Not on file   Years of education: Not on file   Highest education level: Not on file  Occupational History   Not on file  Tobacco Use   Smoking status: Never  Smokeless tobacco: Never  Substance and Sexual Activity   Alcohol use: No   Drug use: No   Sexual activity: Never  Other Topics Concern   Not on file  Social History Narrative   Not on file   Social Determinants of Health   Financial Resource Strain: Not on file  Food Insecurity: No Food Insecurity (12/20/2019)   Hunger Vital Sign    Worried About Running Out of Food in the Last Year: Never true    Ran Out of Food in the Last Year: Never true  Transportation Needs: No Transportation Needs (12/20/2019)   PRAPARE - Hydrologist (Medical): No    Lack of Transportation (Non-Medical): No  Physical Activity: Not on file  Stress: Not on file   Social Connections: Not on file    Allergies:  Allergies  Allergen Reactions   Other Hives and Shortness Of Breath    Develops hives with exposure to cold air.    Claritin [Loratadine]     Fainting spells   Sulfa Antibiotics    Latex Hives    Metabolic Disorder Labs: Lab Results  Component Value Date   HGBA1C 5.3 07/05/2021   Lab Results  Component Value Date   PROLACTIN 17.7 10/17/2021   No results found for: "CHOL", "TRIG", "HDL", "CHOLHDL", "VLDL", "LDLCALC" Lab Results  Component Value Date   TSH 4.43 10/17/2021   TSH 4.900 (H) 03/16/2021    Therapeutic Level Labs: No results found for: "LITHIUM" Lab Results  Component Value Date   VALPROATE 60.0 08/18/2020   VALPROATE 74.5 01/26/2019   No results found for: "CBMZ"  Current Medications: Current Outpatient Medications  Medication Sig Dispense Refill   albuterol (PROVENTIL HFA;VENTOLIN HFA) 108 (90 Base) MCG/ACT inhaler Inhale 2 puffs into the lungs every 6 (six) hours as needed for wheezing or shortness of breath. 1 Inhaler 0   amphetamine-dextroamphetamine (ADDERALL XR) 20 MG 24 hr capsule Take 2 capsules (40 mg total) by mouth every morning. 60 capsule 0   amphetamine-dextroamphetamine (ADDERALL) 10 MG tablet Take 1 tablet (10 mg total) by mouth daily. Fill after 09/02/2021 30 tablet 0   buPROPion (WELLBUTRIN XL) 150 MG 24 hr tablet 450 mg daily. Take along with 300 mg tab 30 tablet 2   buPROPion (WELLBUTRIN XL) 300 MG 24 hr tablet Take 1 tablet (300 mg total) by mouth every morning. 90 tablet 2   clindamycin-benzoyl peroxide (BENZACLIN) gel   3   clonazePAM (KLONOPIN) 0.5 MG tablet Take 1 tablet (0.5 mg total) by mouth 2 (two) times daily as needed for anxiety. 60 tablet 2   desloratadine (CLARINEX) 5 MG tablet Take 5 mg by mouth daily.     divalproex (DEPAKOTE ER) 500 MG 24 hr tablet TAKE TWO TABLETS BY MOUTH EVERY NIGHT AT BEDTIME 60 tablet 2   fluticasone (FLONASE) 50 MCG/ACT nasal spray Place 2 sprays  into both nostrils daily. 16 g 4   gabapentin (NEURONTIN) 300 MG capsule Take 1 capsule (300 mg total) by mouth 3 (three) times daily. 270 capsule 2   levothyroxine (SYNTHROID) 75 MCG tablet Take 1 tablet (75 mcg total) by mouth daily. 90 tablet 3   montelukast (SINGULAIR) 10 MG tablet Take 10 mg by mouth at bedtime.     naproxen (NAPROSYN) 500 MG tablet TAKE 1 TAB BY MOUTH 2 (TWO) TIMES DAILY WITH A MEAL. AS NEEDED FOR MENSTRUAL CRAMPING. 30 tablet 0   norgestrel-ethinyl estradiol (LOW-OGESTREL) 0.3-30 MG-MCG tablet TAKE ONE TABLET  BY MOUTH DAILY CONTINUOUSLY SKIP SUGAR PILL WEEK 28 tablet 11   Olopatadine HCl 0.6 % SOLN U 2 SPRAYS IEN BID  1   ondansetron (ZOFRAN) 4 MG tablet Take 1 tablet (4 mg total) by mouth every 8 (eight) hours as needed for nausea or vomiting. 30 tablet 1   spironolactone (ALDACTONE) 25 MG tablet TK 1 T PO BID  2   traZODone (DESYREL) 100 MG tablet TAKE ONE TABLET BY MOUTH EVERY NIGHT AT BEDTIME 90 tablet 2   VRAYLAR 1.5 MG capsule TAKE ONE CAPSULE BY MOUTH DAILY 30 capsule 2   No current facility-administered medications for this visit.     Musculoskeletal: Strength & Muscle Tone: within normal limits Gait & Station: normal Patient leans: N/A  Psychiatric Specialty Exam: Review of Systems  All other systems reviewed and are negative.   There were no vitals taken for this visit.There is no height or weight on file to calculate BMI.  General Appearance: Casual, Neat, and Well Groomed  Eye Contact:  Good  Speech:  Clear and Coherent  Volume:  Normal  Mood:  Euthymic  Affect:  Appropriate and Congruent  Thought Process:  Goal Directed  Orientation:  Full (Time, Place, and Person)  Thought Content: WDL   Suicidal Thoughts:  No  Homicidal Thoughts:  No  Memory:  Immediate;   Good Recent;   Good Remote;   Fair  Judgement:  Good  Insight:  Fair  Psychomotor Activity:  Normal  Concentration:  Concentration: Good and Attention Span: Good  Recall:  Good   Fund of Knowledge: Good  Language: Good  Akathisia:  No  Handed:  Right  AIMS (if indicated): not done  Assets:  Communication Skills Desire for Improvement Physical Health Resilience Social Support Talents/Skills  ADL's:  Intact  Cognition: WNL  Sleep:  Good   Screenings: GAD-7    Flowsheet Row Office Visit from 07/05/2021 in [facility] Office Visit from 12/20/2019 in [facility] Office Visit from 04/21/2019 in [facility]  Total GAD-7 Score 1 3 2       PHQ2-9    Flowsheet Row [facility] Video Visit from 08/03/2021 [facility] Office Visit from 07/05/2021 in [facility] Video Visit from 09/28/2020 in [facility] Video Visit from 08/23/2020 in [facility]  PHQ-2 Total Score 0 0 1 0 6  PHQ-9 Total Score -- -- 2 0 14      Flowsheet Row Video Visit from 01/10/2022 [facility] Video Visit from 08/03/2021 in [facility] Video Visit from 09/28/2020 in [facility]  C-SSRS RISK CATEGORY No Risk No Risk Error: Question 6 not populated        Assessment and Plan: This patient is a 26 year old female with a history of depression and anger irritability possible mixed bipolar disorder ADHD and difficulty sleeping.  She is doing well on her current regimen.  She will continue Vraylar 1.5 mg for bipolar depression, Depakote ER 1000 mg at bedtime for mood swings, gabapentin 300 mg 3 times daily for anxiety, trazodone 100 mg at bedtime for sleep, Wellbutrin XL 450 mg daily for depression,  Adderall Exar 40 mg every morning for focus and Adderall 10 mg  later in the day also for focus.  She will return to see me in 3 months  Collaboration of Care: Collaboration of Care: Other provider involved in patient's care AEB notes are shared with endocrinology through the epic system  Patient/Guardian was advised Release of Information must be obtained prior to any record release in order to collaborate their care with an outside provider. Patient/Guardian was advised if they have not already done so to contact the registration department to sign all necessary forms in order for [place] to release information regarding their care.   Consent: Patient/Guardian gives verbal consent for treatment and assignment of benefits for services provided during this visit. Patient/Guardian expressed understanding and agreed to proceed.    Levonne Spiller, MD 01/10/2022, 1:14 PM

## 2022-02-04 NOTE — Progress Notes (Signed)
No show

## 2022-04-03 ENCOUNTER — Encounter: Payer: Self-pay | Admitting: Family Medicine

## 2022-05-22 ENCOUNTER — Telehealth: Payer: Self-pay | Admitting: Family Medicine

## 2022-05-22 ENCOUNTER — Encounter: Payer: Self-pay | Admitting: Family Medicine

## 2022-05-22 NOTE — Telephone Encounter (Signed)
Attempted to contact - NA 

## 2022-05-22 NOTE — Telephone Encounter (Signed)
Pt called requesting to speak directly with Dr Alver Sorrow nurse.

## 2022-05-24 ENCOUNTER — Encounter: Payer: Self-pay | Admitting: Family Medicine

## 2022-05-24 ENCOUNTER — Ambulatory Visit: Payer: Medicaid Other | Admitting: Family Medicine

## 2022-05-24 VITALS — BP 125/76 | HR 102 | Temp 97.3°F | Ht 59.0 in | Wt 201.6 lb

## 2022-05-24 DIAGNOSIS — R635 Abnormal weight gain: Secondary | ICD-10-CM

## 2022-05-24 DIAGNOSIS — K219 Gastro-esophageal reflux disease without esophagitis: Secondary | ICD-10-CM

## 2022-05-24 DIAGNOSIS — E039 Hypothyroidism, unspecified: Secondary | ICD-10-CM

## 2022-05-24 DIAGNOSIS — Z833 Family history of diabetes mellitus: Secondary | ICD-10-CM | POA: Diagnosis not present

## 2022-05-24 LAB — BAYER DCA HB A1C WAIVED: HB A1C (BAYER DCA - WAIVED): 5.7 % — ABNORMAL HIGH (ref 4.8–5.6)

## 2022-05-24 MED ORDER — PANTOPRAZOLE SODIUM 40 MG PO TBEC: 40.0000 mg | DELAYED_RELEASE_TABLET | Freq: Every day | ORAL | 0 refills | Status: DC

## 2022-05-24 NOTE — Progress Notes (Signed)
Subjective: CC: GERD PCP: Janora Norlander, DO TKZ:SWFU C Wimes is a 27 y.o. female presenting to clinic today for:  1.  GERD Patient is accompanied today's visit by her mother.  She notes that she has been having difficulty with GERD for most of her life but it has been exacerbated over the last several months.  She was on omeprazole as a youth and has been treated now with Pepcid complete over-the-counter.  She essentially takes Pepcid with every meal every single day.  She denies any NSAID use.  Denies high acidic intake.  Sometimes the acid will be so bad she feels like she is nauseated and is choking.  She reports burning and coughing.  She does note that she has gained quite a bit of weight over the last couple of years and this has been a real struggle for her.  She is try to be aggressive about weight loss with diet and exercise but has been unsuccessful so far.  She read about [facility] and is interested in this medication if she is appropriate for it.  She is currently treated for thyroid disorder, mood disorder.   ROS: Per HPI  Allergies  Allergen Reactions   Other Hives and Shortness Of Breath    Develops hives with exposure to cold air.    Claritin [Loratadine]     Fainting spells   Sulfa Antibiotics    Latex Hives   Past Medical History:  Diagnosis Date   ADHD (attention deficit hyperactivity disorder)    Allergy    Anxiety    Constipation    Environmental allergies    Nausea    Postural hypotension     Current Outpatient Medications:    albuterol (PROVENTIL HFA;VENTOLIN HFA) 108 (90 Base) MCG/ACT inhaler, Inhale 2 puffs into the lungs every 6 (six) hours as needed for wheezing or shortness of breath., Disp: 1 Inhaler, Rfl: 0   amphetamine-dextroamphetamine (ADDERALL XR) 20 MG 24 hr capsule, Take 2 capsules (40 mg total) by mouth every morning., Disp: 60 capsule, Rfl: 0   amphetamine-dextroamphetamine (ADDERALL) 10 MG tablet, Take 1 tablet (10 mg total) by mouth  daily., Disp: 30 tablet, Rfl: 0   buPROPion (WELLBUTRIN XL) 150 MG 24 hr tablet, 450 mg daily. Take along with 300 mg tab, Disp: 30 tablet, Rfl: 2   buPROPion (WELLBUTRIN XL) 300 MG 24 hr tablet, Take 1 tablet (300 mg total) by mouth every morning., Disp: 90 tablet, Rfl: 2   cariprazine (VRAYLAR) 1.5 MG capsule, Take 1 capsule (1.5 mg total) by mouth daily., Disp: 30 capsule, Rfl: 2   clindamycin-benzoyl peroxide (BENZACLIN) gel, , Disp: , Rfl: 3   clonazePAM (KLONOPIN) 0.5 MG tablet, Take 1 tablet (0.5 mg total) by mouth 2 (two) times daily as needed for anxiety., Disp: 60 tablet, Rfl: 2   desloratadine (CLARINEX) 5 MG tablet, Take 5 mg by mouth daily., Disp: , Rfl:    divalproex (DEPAKOTE ER) 500 MG 24 hr tablet, TAKE TWO TABLETS BY MOUTH EVERY NIGHT AT BEDTIME, Disp: 60 tablet, Rfl: 2   fluticasone (FLONASE) 50 MCG/ACT nasal spray, Place 2 sprays into both nostrils daily., Disp: 16 g, Rfl: 4   gabapentin (NEURONTIN) 300 MG capsule, Take 1 capsule (300 mg total) by mouth 3 (three) times daily., Disp: 270 capsule, Rfl: 2   levothyroxine (SYNTHROID) 75 MCG tablet, Take 1 tablet (75 mcg total) by mouth daily., Disp: 90 tablet, Rfl: 3   montelukast (SINGULAIR) 10 MG tablet, Take 10 mg  by mouth at bedtime., Disp: , Rfl:    naproxen (NAPROSYN) 500 MG tablet, TAKE 1 TAB BY MOUTH 2 (TWO) TIMES DAILY WITH A MEAL. AS NEEDED FOR MENSTRUAL CRAMPING., Disp: 30 tablet, Rfl: 0   norgestrel-ethinyl estradiol (LOW-OGESTREL) 0.3-30 MG-MCG tablet, TAKE ONE TABLET BY MOUTH DAILY CONTINUOUSLY SKIP SUGAR PILL WEEK, Disp: 28 tablet, Rfl: 11   Olopatadine HCl 0.6 % SOLN, U 2 SPRAYS IEN BID, Disp: , Rfl: 1   ondansetron (ZOFRAN) 4 MG tablet, Take 1 tablet (4 mg total) by mouth every 8 (eight) hours as needed for nausea or vomiting., Disp: 30 tablet, Rfl: 1   spironolactone (ALDACTONE) 25 MG tablet, TK 1 T PO BID, Disp: , Rfl: 2   traZODone (DESYREL) 100 MG tablet, Take 1 tablet (100 mg total) by mouth at bedtime., Disp:  90 tablet, Rfl: 2   amphetamine-dextroamphetamine (ADDERALL XR) 20 MG 24 hr capsule, Take 2 capsules (40 mg total) by mouth every morning. (Patient not taking: Reported on 05/24/2022), Disp: 60 capsule, Rfl: 0   amphetamine-dextroamphetamine (ADDERALL XR) 20 MG 24 hr capsule, Take 2 capsules (40 mg total) by mouth every morning. (Patient not taking: Reported on 05/24/2022), Disp: 60 capsule, Rfl: 0   amphetamine-dextroamphetamine (ADDERALL) 10 MG tablet, Take 1 tablet (10 mg total) by mouth daily. (Patient not taking: Reported on 05/24/2022), Disp: 90 tablet, Rfl: 0   amphetamine-dextroamphetamine (ADDERALL) 10 MG tablet, Take 1 tablet (10 mg total) by mouth daily. (Patient not taking: Reported on 05/24/2022), Disp: 90 tablet, Rfl: 0 Social History   Socioeconomic History   Marital status: Single    Spouse name: Not on file   Number of children: Not on file   Years of education: Not on file   Highest education level: Not on file  Occupational History   Not on file  Tobacco Use   Smoking status: Never   Smokeless tobacco: Never  Substance and Sexual Activity   Alcohol use: No   Drug use: No   Sexual activity: Never  Other Topics Concern   Not on file  Social History Narrative   Not on file   Social Determinants of Health   Financial Resource Strain: Not on file  Food Insecurity: No Food Insecurity (12/20/2019)   Hunger Vital Sign    Worried About Running Out of Food in the Last Year: Never true    Ran Out of Food in the Last Year: Never true  Transportation Needs: No Transportation Needs (12/20/2019)   PRAPARE - Hydrologist (Medical): No    Lack of Transportation (Non-Medical): No  Physical Activity: Not on file  Stress: Not on file  Social Connections: Not on file  Intimate Partner Violence: Not on file   Family History  Problem Relation Age of Onset   Diabetes Mother    Bipolar disorder Father    Bipolar disorder Sister    Bipolar disorder Brother     Thyroid disease Brother    Bipolar disorder Brother    Bipolar disorder Brother    Bipolar disorder Maternal Grandmother    Bipolar disorder Paternal Grandmother    Bipolar disorder Paternal Grandfather    Alcohol abuse Paternal Grandfather    Bipolar disorder Paternal Uncle    Alcohol abuse Paternal Uncle     Objective: Office vital signs reviewed. BP 125/76   Pulse (!) 102   Temp (!) 97.3 F (36.3 C)   Ht 4\' 11"  (1.499 m)   Wt 201 lb 9.6  oz (91.4 kg)   SpO2 95%   BMI 40.72 kg/m   Physical Examination:  General: Awake, alert, morbidly obese, No acute distress HEENT: Sclera white.  Moist mucous membranes Cardio: regular rate and rhythm, S1S2 heard, no murmurs appreciated Pulm: clear to auscultation bilaterally, no wheezes, rhonchi or rales; normal work of breathing on room air  Assessment/ Plan: 27 y.o. female   Gastroesophageal reflux disease without esophagitis - Plan: pantoprazole (PROTONIX) 40 MG tablet  Morbid obesity (HCC) - Plan: Bayer DCA Hb A1c Waived, CMP14+EGFR, TSH, T4, Free, CBC  Weight gain  Family history of diabetes mellitus  Acquired hypothyroidism - Plan: TSH, T4, Free  Will trial pantoprazole for the next month.  We discussed that if no significant improvement in acid reflux, may need to consider referral to GI for EGD and further testing.  There is a very strong family history that this is more likely to be anatomic/physiologic but we discussed alternative causes including infection, hiatal hernia etc.  I do think that her acid reflux is exacerbated by her body habitus.  She unfortunately has really been trying to pursue lifestyle modification without much success with weight loss.  She was interested in GLP class for treatment of her weight and I do think that she is an appropriate patient for this but unfortunately as she is on [place] Medicaid this is not covered by her insurance.  She is already treated with a stimulant medication and Wellbutrin  fork mood issues and attention issues.  Weight is likely complicated by hypothyroidism.  Her A1c demonstrated prediabetic range A1c so lifestyle modification and reduction in weight will be very important for this patient who has a strong family history of type 2 diabetes.  Will check fasting metabolic labs for this patient today including her thyroid levels and renal function given spironolactone use.  She is to continue following up with her specialist as needed these issues    No orders of the defined types were placed in this encounter.  No orders of the defined types were placed in this encounter.    Raliegh Ip, DO [facility] [phone]  Genella Rife

## 2022-05-24 NOTE — Patient Instructions (Addendum)
Check with insurance re: Wegov[employer] and Zepbound. Medicaid and other government funded agencies have not covered this.  Food Choices for Gastroesophageal Reflux Disease, Adult When you have gastroesophageal reflux disease (GERD), the foods you eat and your eating habits are very important. Choosing the right foods can help ease the discomfort of GERD. Consider working with a dietitian to help you make healthy food choices. What are tips for following this plan? Reading food labels Look for foods that are low in saturated fat. Foods that have less than 5% of daily value (DV) of fat and 0 g of trans fats may help with your symptoms. Cooking Cook foods using methods other than frying. This may include baking, steaming, grilling, or broiling. These are all methods that do not need a lot of fat for cooking. To add flavor, try to use herbs that are low in spice and acidity. Meal planning  Choose healthy foods that are low in fat, such as fruits, vegetables, whole grains, low-fat dairy products, lean meats, fish, and poultry. Eat frequent, small meals instead of three large meals each day. Eat your meals slowly, in a relaxed setting. Avoid bending over or lying down until 2-3 hours after eating. Limit high-fat foods such as fatty meats or fried foods. Limit your intake of fatty foods, such as oils, butter, and shortening. Avoid the following as told by your health care provider: Foods that cause symptoms. These may be different for different people. Keep a food diary to keep track of foods that cause symptoms. Alcohol. Drinking large amounts of liquid with meals. Eating meals during the 2-3 hours before bed. Lifestyle Maintain a healthy weight. Ask your health care provider what weight is healthy for you. If you need to lose weight, work with your health care provider to do so safely. Exercise for at least 30 minutes on 5 or more days each week, or as told by your health care provider. Avoid wearing  clothes that fit tightly around your waist and chest. Do not use any products that contain nicotine or tobacco. These products include cigarettes, chewing tobacco, and vaping devices, such as e-cigarettes. If you need help quitting, ask your health care provider. Sleep with the head of your bed raised. Use a wedge under the mattress or blocks under the bed frame to raise the head of the bed. Chew sugar-free gum after mealtimes. What foods should I eat?  Eat a healthy, well-balanced diet of fruits, vegetables, whole grains, low-fat dairy products, lean meats, fish, and poultry. Each person is different. Foods that may trigger symptoms in one person may not trigger any symptoms in another person. Work with your health care provider to identify foods that are safe for you. The items listed above may not be a complete list of recommended foods and beverages. Contact a dietitian for more information. What foods should I avoid? Limiting some of these foods may help manage the symptoms of GERD. Everyone is different. Consult a dietitian or your health care provider to help you identify the exact foods to avoid, if any. Fruits Any fruits prepared with added fat. Any fruits that cause symptoms. For some people this may include citrus fruits, such as oranges, grapefruit, pineapple, and lemons. Vegetables Deep-fried vegetables. [place] fries. Any vegetables prepared with added fat. Any vegetables that cause symptoms. For some people, this may include tomatoes and tomato products, chili peppers, onions and garlic, and horseradish. Grains Pastries or quick breads with added fat. Meats and other proteins High-fat meats, such  as fatty beef or pork, hot dogs, ribs, ham, sausage, salami, and bacon. Fried meat or protein, including fried fish and fried chicken. Nuts and nut butters, in large amounts. Dairy Whole milk and chocolate milk. Sour cream. Cream. Ice cream. Cream cheese. Milkshakes. Fats and oils Butter.  Margarine. Shortening. Ghee. Beverages Coffee and tea, with or without caffeine. Carbonated beverages. Sodas. Energy drinks. Fruit juice made with acidic fruits, such as orange or grapefruit. Tomato juice. Alcoholic drinks. Sweets and desserts Chocolate and cocoa. Donuts. Seasonings and condiments Pepper. Peppermint and spearmint. Added salt. Any condiments, herbs, or seasonings that cause symptoms. For some people, this may include curry, hot sauce, or vinegar-based salad dressings. The items listed above may not be a complete list of foods and beverages to avoid. Contact a dietitian for more information. Questions to ask your health care provider Diet and lifestyle changes are usually the first steps that are taken to manage symptoms of GERD. If diet and lifestyle changes do not improve your symptoms, talk with your health care provider about taking medicines. Where to find more information International [employer] for Gastrointestinal Disorders: aboutgerd.org Summary When you have gastroesophageal reflux disease (GERD), food and lifestyle choices may be very helpful in easing the discomfort of GERD. Eat frequent, small meals instead of three large meals each day. Eat your meals slowly, in a relaxed setting. Avoid bending over or lying down until 2-3 hours after eating. Limit high-fat foods such as fatty meats or fried foods. This information is not intended to replace advice given to you by your health care provider. Make sure you discuss any questions you have with your health care provider. Document Revised: 11/08/2019 Document Reviewed: 11/08/2019 Elsevier Patient Education  [place].

## 2022-05-25 LAB — CMP14+EGFR
ALT: 25 IU/L (ref 0–32)
AST: 23 IU/L (ref 0–40)
Albumin/Globulin Ratio: 1.8 (ref 1.2–2.2)
Albumin: 4.6 g/dL (ref 4.0–5.0)
Alkaline Phosphatase: 71 IU/L (ref 44–121)
BUN/Creatinine Ratio: 12 (ref 9–23)
BUN: 10 mg/dL (ref 6–20)
Bilirubin Total: 0.2 mg/dL (ref 0.0–1.2)
CO2: 19 mmol/L — ABNORMAL LOW (ref 20–29)
Calcium: 9.5 mg/dL (ref 8.7–10.2)
Chloride: 101 mmol/L (ref 96–106)
Creatinine, Ser: 0.86 mg/dL (ref 0.57–1.00)
Globulin, Total: 2.5 g/dL (ref 1.5–4.5)
Glucose: 81 mg/dL (ref 70–99)
Potassium: 4.3 mmol/L (ref 3.5–5.2)
Sodium: 139 mmol/L (ref 134–144)
Total Protein: 7.1 g/dL (ref 6.0–8.5)
eGFR: 97 mL/min/{1.73_m2} (ref >59)

## 2022-05-25 LAB — CBC
Hematocrit: 39.9 % (ref 34.0–46.6)
Hemoglobin: 13.5 g/dL (ref 11.1–15.9)
MCH: 29.4 pg (ref 26.6–33.0)
MCHC: 33.8 g/dL (ref 31.5–35.7)
MCV: 87 fL (ref 79–97)
Platelets: 339 10*3/uL (ref 150–450)
RBC: 4.59 x10E6/uL (ref 3.77–5.28)
RDW: 13.8 % (ref 11.7–15.4)
WBC: 8.3 10*3/uL (ref 3.4–10.8)

## 2022-05-25 LAB — T4, FREE: Free T4: 1.01 ng/dL (ref 0.82–1.77)

## 2022-05-25 LAB — TSH: TSH: 18.5 u[IU]/mL — ABNORMAL HIGH (ref 0.450–4.500)

## 2022-06-05 ENCOUNTER — Telehealth: Payer: Self-pay | Admitting: Internal Medicine

## 2022-06-05 NOTE — Telephone Encounter (Signed)
Patient is scheduled for 06/07/2022 at 9:40 AM with Dr. Cruzita Lederer.

## 2022-06-05 NOTE — Telephone Encounter (Signed)
OK to see her as an IV for hypothyroidism. I need 40 min for her.

## 2022-06-05 NOTE — Telephone Encounter (Signed)
Patient's mother, Samantha Tucker, was in office today and states that she and Samantha Tucker would like to transfer care from Dr. Dwyane Dee to Dr. Cruzita Lederer.  Threasa Beards states that this was already approved by Dr. Cruzita Lederer, but no information found to support this.  Approval is needed to change providers.

## 2022-06-07 ENCOUNTER — Ambulatory Visit: Payer: Medicaid Other | Admitting: Internal Medicine

## 2022-06-07 ENCOUNTER — Encounter: Payer: Self-pay | Admitting: Internal Medicine

## 2022-06-07 VITALS — BP 110/74 | HR 100 | Ht 59.0 in | Wt 202.0 lb

## 2022-06-07 DIAGNOSIS — E039 Hypothyroidism, unspecified: Secondary | ICD-10-CM | POA: Diagnosis not present

## 2022-06-07 MED ORDER — LEVOTHYROXINE SODIUM 100 MCG PO TABS: 100.0000 ug | ORAL_TABLET | Freq: Every day | ORAL | 3 refills | Status: DC

## 2022-06-07 NOTE — Patient Instructions (Signed)
Please increase Levothyroxine to 100 mcg daily.  Take the thyroid hormone every day, with water, at least 30 minutes before breakfast, separated by at least 4 hours from: - acid reflux medications - calcium - iron - multivitamins  Place the levothyroxine tablets on your nightstand.  Return for labs in ~5 weeks.  You should have an endocrinology follow-up appointment in 4 months.

## 2022-06-07 NOTE — Progress Notes (Signed)
Patient ID: Samantha Tucker, female   DOB: [birth year], 27 y.o.   MRN: [id number]  HPI  Samantha Tucker is a 27 y.o.-year-old female, referred by her PCP, Dr. Lajuana Ripple, for management of hypothyroidism.  She previously saw Loanne Drilling and then Dr. Dwyane Dee in the practice, with last visit with Dr. Kumar 7 months ago.  She is here with her mother, Samantha Tucker, who is also my patient.  She offers part of the history including PMH, thyroid medication doses, symptoms.  Pt. has been dx with hypothyroidism in 2019 - irregular menses, low AP, mood swings. She was started on Levothyroxine. She started to feel better, however, she reports eating a significant amount of weight since then, per our chart approximately 60 pounds.  She gained approximately 23 pounds in the last year.  Pt. is on levothyroxine 75 mcg daily, taken: - in am - fasting - previously 30 min from b'fast, now skipping b'fast  - no calcium - no iron - no multivitamins - she started  PPIs (Protonix) - 05/2022, prev. Pepcid for a long time - 30 min -1h after LT4 - not on Biotin  I reviewed pt's thyroid tests: Lab Results  Component Value Date   TSH 18.500 (H) 05/24/2022   TSH 4.43 10/17/2021   TSH 4.900 (H) 03/16/2021   TSH 3.04 10/06/2020   TSH 4.150 04/21/2019   TSH 6.39 (H) 01/22/2019   TSH 5.00 07/20/2018   TSH 4.790 (H) 02/17/2018   TSH 3.08 01/14/2018   TSH 4.930 (H) 08/22/2017   FREET4 1.01 05/24/2022   FREET4 0.80 10/17/2021   FREET4 1.26 03/16/2021   FREET4 0.87 10/06/2020   FREET4 0.88 01/22/2019   FREET4 0.73 07/20/2018   FREET4 1.33 08/22/2017   FREET4 1.07 07/22/2017   Antithyroid antibodies: No results found for: "THGAB" No components found for: "TPOAB"  Pt denies: - feeling nodules in neck - hoarseness - dysphagia - SOB with lying down She has choking with GERD sxs.  She has + FH of thyroid disorders in: Brother. No FH of thyroid cancer. No h/o radiation tx to head or neck. No recent use of iodine supplements.   No herbal supplements.  No recent steroid use.  Pt. also has a history of suspected PCOS:  Reviewed previous investigation:   - Normal pelvic ultrasound 10/08/2018 (ordered by OB/GYN-Dr. Hulan Fray): Uterus: 8.3 x 3.4 x 4.3 cm = volume: 63.4 mL. No fibroids or other mass visualized. Endometrium Thickness: 4.1 mm.  Normal trilaminar appearance Right ovary: 4.1 x 2.1 x 2.0 cm = volume: 9.1 mL. Corpus luteum noted. Left ovary: 2.5 x 1.2 x 1.6 cm = volume: 2.6 mL. Normal appearance/no adnexal mass. Other findings:  No abnormal free fluid   IMPRESSION: 1. Normal pelvic sonogram.  - Normal ACTH and cortisol levels in 03/2021: Component     Latest Ref Rng 03/16/2021  Cortisol, Plasma     mcg/dL 11.5   C206 ACTH     6 - 50 pg/mL 15    - Normal testosterone and prolactin levels in 10/2021 - Dr. Dwyane Dee advised her that she likely did not have PCOS and was not a candidate for metformin.  He recommended a referral to the Weight management clinic. Component     Latest Ref Rng 10/17/2021  Testosterone     13 - 71 ng/dL 26   Testosterone Free     0.0 - 4.2 pg/mL 1.5   Sex Horm Binding Glob, Serum     24.6 - 122.0 nmol/L  26.4   Prolactin     4.8 - 23.3 ng/mL 17.7    She is on spironolactone.  She has a h/o POTS - dx'ed 2014 - now controlled.  ROS: + see HPI  Past Medical History:  Diagnosis Date   ADHD (attention deficit hyperactivity disorder)    Allergy    Anxiety    Constipation    Environmental allergies    Nausea    Postural hypotension    Past Surgical History:  Procedure Laterality Date   TYMPANOSTOMY TUBE PLACEMENT Bilateral [id number]   Social History   Socioeconomic History   Marital status: Single    Spouse name: Not on file   Number of children: Not on file   Years of education: Not on file   Highest education level: Not on file  Occupational History   Not on file  Tobacco Use   Smoking status: Never   Smokeless tobacco: Never  Substance and Sexual Activity    Alcohol use: No   Drug use: No   Sexual activity: Never  Other Topics Concern   Not on file  Social History Narrative   Not on file   Social Determinants of Health   Financial Resource Strain: Not on file  Food Insecurity: No Food Insecurity (12/20/2019)   Hunger Vital Sign    Worried About Running Out of Food in the Last Year: Never true    Ran Out of Food in the Last Year: Never true  Transportation Needs: No Transportation Needs (12/20/2019)   PRAPARE - Hydrologist (Medical): No    Lack of Transportation (Non-Medical): No  Physical Activity: Not on file  Stress: Not on file  Social Connections: Not on file  Intimate Partner Violence: Not on file   Current Outpatient Medications on File Prior to Visit  Medication Sig Dispense Refill   albuterol (PROVENTIL HFA;VENTOLIN HFA) 108 (90 Base) MCG/ACT inhaler Inhale 2 puffs into the lungs every 6 (six) hours as needed for wheezing or shortness of breath. 1 Inhaler 0   amphetamine-dextroamphetamine (ADDERALL XR) 20 MG 24 hr capsule Take 2 capsules (40 mg total) by mouth every morning. 60 capsule 0   amphetamine-dextroamphetamine (ADDERALL XR) 20 MG 24 hr capsule Take 2 capsules (40 mg total) by mouth every morning. (Patient not taking: Reported on 05/24/2022) 60 capsule 0   amphetamine-dextroamphetamine (ADDERALL XR) 20 MG 24 hr capsule Take 2 capsules (40 mg total) by mouth every morning. (Patient not taking: Reported on 05/24/2022) 60 capsule 0   amphetamine-dextroamphetamine (ADDERALL) 10 MG tablet Take 1 tablet (10 mg total) by mouth daily. (Patient not taking: Reported on 05/24/2022) 90 tablet 0   amphetamine-dextroamphetamine (ADDERALL) 10 MG tablet Take 1 tablet (10 mg total) by mouth daily. (Patient not taking: Reported on 05/24/2022) 90 tablet 0   buPROPion (WELLBUTRIN XL) 150 MG 24 hr tablet 450 mg daily. Take along with 300 mg tab 30 tablet 2   buPROPion (WELLBUTRIN XL) 300 MG 24 hr tablet Take 1 tablet (300  mg total) by mouth every morning. 90 tablet 2   cariprazine (VRAYLAR) 1.5 MG capsule Take 1 capsule (1.5 mg total) by mouth daily. 30 capsule 2   clindamycin-benzoyl peroxide (BENZACLIN) gel   3   clonazePAM (KLONOPIN) 0.5 MG tablet Take 1 tablet (0.5 mg total) by mouth 2 (two) times daily as needed for anxiety. 60 tablet 2   desloratadine (CLARINEX) 5 MG tablet Take 5 mg by mouth daily.  divalproex (DEPAKOTE ER) 500 MG 24 hr tablet TAKE TWO TABLETS BY MOUTH EVERY NIGHT AT BEDTIME 60 tablet 2   fluticasone (FLONASE) 50 MCG/ACT nasal spray Place 2 sprays into both nostrils daily. 16 g 4   gabapentin (NEURONTIN) 300 MG capsule Take 1 capsule (300 mg total) by mouth 3 (three) times daily. 270 capsule 2   levothyroxine (SYNTHROID) 75 MCG tablet Take 1 tablet (75 mcg total) by mouth daily. 90 tablet 3   montelukast (SINGULAIR) 10 MG tablet Take 10 mg by mouth at bedtime.     naproxen (NAPROSYN) 500 MG tablet TAKE 1 TAB BY MOUTH 2 (TWO) TIMES DAILY WITH A MEAL. AS NEEDED FOR MENSTRUAL CRAMPING. 30 tablet 0   norgestrel-ethinyl estradiol (LOW-OGESTREL) 0.3-30 MG-MCG tablet TAKE ONE TABLET BY MOUTH DAILY CONTINUOUSLY SKIP SUGAR PILL WEEK 28 tablet 11   Olopatadine HCl 0.6 % SOLN U 2 SPRAYS IEN BID  1   ondansetron (ZOFRAN) 4 MG tablet Take 1 tablet (4 mg total) by mouth every 8 (eight) hours as needed for nausea or vomiting. 30 tablet 1   pantoprazole (PROTONIX) 40 MG tablet Take 1 tablet (40 mg total) by mouth daily. 90 tablet 0   spironolactone (ALDACTONE) 25 MG tablet TK 1 T PO BID  2   traZODone (DESYREL) 100 MG tablet Take 1 tablet (100 mg total) by mouth at bedtime. 90 tablet 2   No current facility-administered medications on file prior to visit.   Allergies  Allergen Reactions   Other Hives and Shortness Of Breath    Develops hives with exposure to cold air.    Claritin [Loratadine]     Fainting spells   Sulfa Antibiotics    Latex Hives   Family History  Problem Relation Age of Onset    Diabetes Mother    Bipolar disorder Father    Bipolar disorder Sister    Bipolar disorder Brother    Thyroid disease Brother    Bipolar disorder Brother    Bipolar disorder Brother    Bipolar disorder Maternal Grandmother    Bipolar disorder Paternal Grandmother    Bipolar disorder Paternal Grandfather    Alcohol abuse Paternal Grandfather    Bipolar disorder Paternal Uncle    Alcohol abuse Paternal Uncle    PE: BP 110/74 (BP Location: Left Arm, Patient Position: Sitting, Cuff Size: Large)   Pulse 100   Ht 4\' 11"  (1.499 m)   Wt 202 lb (91.6 kg)   SpO2 97%   BMI 40.80 kg/m  Wt Readings from Last 3 Encounters:  06/07/22 202 lb (91.6 kg)  05/24/22 201 lb 9.6 oz (91.4 kg)  10/11/21 197 lb 3.2 oz (89.4 kg)   Constitutional: overweight, in NAD Eyes:  EOMI, no exophthalmos ENT: no neck masses, no cervical lymphadenopathy Cardiovascular: RRR, No MRG Respiratory: CTA B Musculoskeletal: no deformities Skin:no rashes Neurological: no tremor with outstretched hands  ASSESSMENT: 1. Hypothyroidism  2.  Weight gain  PLAN:  1. Patient with long-standing hypothyroidism, on levothyroxine therapy. - she does not appear to have a goiter, thyroid nodules, or neck compression symptoms - latest thyroid labs reviewed with pt. >> TSH was high: Lab Results  Component Value Date   TSH 18.500 (H) 05/24/2022  - she continues on LT4 75 mcg daily, but it does not look like this dose is enough for her.  At today's visit, I advised her to increase the dose to 100 mcg daily. - we discussed about taking the thyroid hormone every day, with  water, >30 minutes before breakfast, separated by >4 hours from acid reflux medications, calcium, iron, multivitamins. Pt. Is not taking it correctly -she was previously taking it too close to Prevacid in a.m., now on Protonix, but she recently stopped eating breakfast so she takes Protonix later in the day.  We discussed about the need to separate acid reflux  medication from levothyroxine by at least 4 hours.  I also advised her to move the levothyroxine bottle on the nightstand, since now she has it in her back in her room.  I advised her that if she misses a dose, she can take 2 tablets the next day. - will check thyroid tests in 5 weeks: TSH and fT4 and will add TPO and ATA antibodies to screen for Hashimoto's disease. - Otherwise, I will see her back in 4 months  2.  Weight gain -She gained approximately 23 pounds in the last year, but approximately 60 pounds since her hyperthyroidism diagnosis in 2019 -Reviewing previous investigation, there was no clear indication of PCOS (transvaginal ultrasound and testosterone levels were normal); cortisol and ACTH levels were normal; prolactin level was normal -She does have a history of depression, which can contribute -However, her thyroid tests are fluctuating, mostly above target.  We discussed that this can cause wt gain or difficulty losing weight. -For now, we will increase her levothyroxine dose and we discussed about how to take it correctly.  Patient Instructions  Please increase Levothyroxine to 100 mcg daily.  Take the thyroid hormone every day, with water, at least 30 minutes before breakfast, separated by at least 4 hours from: - acid reflux medications - calcium - iron - multivitamins  Place the levothyroxine tablets on your nightstand.  Return for labs in ~5 weeks.  You should have an endocrinology follow-up appointment in 4 months.  Orders Placed This Encounter  Procedures   TSH   T4, free   Thyroid peroxidase antibody   Thyroglobulin antibody   - Total time spent for the visit: 40 min, in precharting, postcharting, reviewing Dr. Lucianne Muss and [facility]'s last notes, obtaining medical information from the chart and from the pt. and her mother, reviewing her  previous labs, evaluations, and treatments, reviewing her symptoms, counseling her about her thyroid condition and weight gain  (please see the discussed topics above), and developing a plan to further treat investigate and treat them.  Carlus Pavlov, MD PhD [facility] Endocrinology

## 2022-06-24 ENCOUNTER — Other Ambulatory Visit (HOSPITAL_COMMUNITY): Payer: Self-pay | Admitting: Psychiatry

## 2022-06-24 DIAGNOSIS — J3081 Allergic rhinitis due to animal (cat) (dog) hair and dander: Secondary | ICD-10-CM | POA: Diagnosis not present

## 2022-06-24 DIAGNOSIS — L503 Dermatographic urticaria: Secondary | ICD-10-CM | POA: Diagnosis not present

## 2022-06-24 DIAGNOSIS — R052 Subacute cough: Secondary | ICD-10-CM | POA: Diagnosis not present

## 2022-06-24 DIAGNOSIS — J301 Allergic rhinitis due to pollen: Secondary | ICD-10-CM | POA: Diagnosis not present

## 2022-06-24 MED ORDER — AMPHETAMINE-DEXTROAMPHET ER 20 MG PO CP24: 40.0000 mg | ORAL_CAPSULE | ORAL | 0 refills | Status: DC

## 2022-07-03 ENCOUNTER — Encounter: Payer: Self-pay | Admitting: Family Medicine

## 2022-07-18 ENCOUNTER — Other Ambulatory Visit (INDEPENDENT_AMBULATORY_CARE_PROVIDER_SITE_OTHER): Payer: Medicaid Other

## 2022-07-18 ENCOUNTER — Other Ambulatory Visit (HOSPITAL_COMMUNITY): Payer: Self-pay | Admitting: Psychiatry

## 2022-07-18 DIAGNOSIS — E039 Hypothyroidism, unspecified: Secondary | ICD-10-CM | POA: Diagnosis not present

## 2022-07-18 LAB — TSH: TSH: 3.28 u[IU]/mL (ref 0.35–5.50)

## 2022-07-18 LAB — T4, FREE: Free T4: 1.06 ng/dL (ref 0.60–1.60)

## 2022-07-19 LAB — THYROID PEROXIDASE ANTIBODY: Thyroperoxidase Ab SerPl-aCnc: 325 IU/mL — ABNORMAL HIGH (ref <9)

## 2022-07-19 LAB — THYROGLOBULIN ANTIBODY: Thyroglobulin Ab: 12 IU/mL — ABNORMAL HIGH (ref <=1)

## 2022-07-24 ENCOUNTER — Encounter (INDEPENDENT_AMBULATORY_CARE_PROVIDER_SITE_OTHER): Payer: Medicaid Other | Admitting: Family Medicine

## 2022-07-24 DIAGNOSIS — R110 Nausea: Secondary | ICD-10-CM

## 2022-07-24 DIAGNOSIS — H5310 Unspecified subjective visual disturbances: Secondary | ICD-10-CM

## 2022-07-24 MED ORDER — ONDANSETRON 4 MG PO TBDP: 4.0000 mg | ORAL_TABLET | Freq: Three times a day (TID) | ORAL | 0 refills | Status: DC | PRN

## 2022-07-24 NOTE — Telephone Encounter (Signed)

## 2022-08-06 ENCOUNTER — Encounter: Payer: Self-pay | Admitting: Family Medicine

## 2022-08-23 ENCOUNTER — Encounter: Payer: Self-pay | Admitting: Family Medicine

## 2022-08-23 ENCOUNTER — Ambulatory Visit: Payer: Medicaid Other | Admitting: Family Medicine

## 2022-08-23 VITALS — BP 116/76 | HR 93 | Temp 98.5°F | Ht 59.0 in | Wt 186.0 lb

## 2022-08-23 DIAGNOSIS — H5310 Unspecified subjective visual disturbances: Secondary | ICD-10-CM | POA: Diagnosis not present

## 2022-08-23 DIAGNOSIS — K219 Gastro-esophageal reflux disease without esophagitis: Secondary | ICD-10-CM

## 2022-08-23 DIAGNOSIS — R110 Nausea: Secondary | ICD-10-CM | POA: Diagnosis not present

## 2022-08-23 MED ORDER — PANTOPRAZOLE SODIUM 40 MG PO TBEC: 40.0000 mg | DELAYED_RELEASE_TABLET | Freq: Every day | ORAL | 3 refills | Status: DC

## 2022-08-23 MED ORDER — ONDANSETRON 4 MG PO TBDP: 4.0000 mg | ORAL_TABLET | Freq: Three times a day (TID) | ORAL | 1 refills | Status: AC | PRN

## 2022-08-23 NOTE — Progress Notes (Signed)
Subjective: CC: Follow-up GERD PCP: Raliegh Ip, DO ZOX:WRUE C Samantha Tucker is a 27 y.o. female presenting to clinic today for:  1.  GERD Patient was last seen 3 months ago for GERD.  She reports great improvement in acid reflux with Protonix 40 mg.  She would like to continue.  Occasionally still gets some nausea but this seems to be secondary to eyestrain as she is on a computer of some sort all day long.  This seems to be slightly alleviated by being in a dark room.   ROS: Per HPI  Allergies  Allergen Reactions   Other Hives and Shortness Of Breath    Develops hives with exposure to cold air.    Claritin [Loratadine]     Fainting spells   Sulfa Antibiotics    Latex Hives   Past Medical History:  Diagnosis Date   ADHD (attention deficit hyperactivity disorder)    Allergy    Anxiety    Constipation    Environmental allergies    Nausea    Postural hypotension     Current Outpatient Medications:    albuterol (PROVENTIL HFA;VENTOLIN HFA) 108 (90 Base) MCG/ACT inhaler, Inhale 2 puffs into the lungs every 6 (six) hours as needed for wheezing or shortness of breath., Disp: 1 Inhaler, Rfl: 0   amphetamine-dextroamphetamine (ADDERALL XR) 20 MG 24 hr capsule, Take 2 capsules (40 mg total) by mouth every morning., Disp: 60 capsule, Rfl: 0   amphetamine-dextroamphetamine (ADDERALL XR) 20 MG 24 hr capsule, Take 2 capsules (40 mg total) by mouth every morning., Disp: 60 capsule, Rfl: 0   amphetamine-dextroamphetamine (ADDERALL XR) 20 MG 24 hr capsule, Take 2 capsules (40 mg total) by mouth every morning., Disp: 60 capsule, Rfl: 0   amphetamine-dextroamphetamine (ADDERALL) 10 MG tablet, Take 1 tablet (10 mg total) by mouth daily., Disp: 90 tablet, Rfl: 0   amphetamine-dextroamphetamine (ADDERALL) 10 MG tablet, Take 1 tablet (10 mg total) by mouth daily., Disp: 90 tablet, Rfl: 0   buPROPion (WELLBUTRIN XL) 150 MG 24 hr tablet, 450 mg daily. Take along with 300 mg tab, Disp: 30 tablet, Rfl:  2   buPROPion (WELLBUTRIN XL) 300 MG 24 hr tablet, Take 1 tablet (300 mg total) by mouth every morning., Disp: 90 tablet, Rfl: 2   clindamycin-benzoyl peroxide (BENZACLIN) gel, , Disp: , Rfl: 3   clonazePAM (KLONOPIN) 0.5 MG tablet, Take 1 tablet (0.5 mg total) by mouth 2 (two) times daily as needed for anxiety., Disp: 60 tablet, Rfl: 2   desloratadine (CLARINEX) 5 MG tablet, Take 5 mg by mouth daily., Disp: , Rfl:    divalproex (DEPAKOTE ER) 500 MG 24 hr tablet, TAKE TWO TABLETS BY MOUTH EVERY NIGHT AT BEDTIME, Disp: 60 tablet, Rfl: 2   fluticasone (FLONASE) 50 MCG/ACT nasal spray, Place 2 sprays into both nostrils daily., Disp: 16 g, Rfl: 4   gabapentin (NEURONTIN) 300 MG capsule, Take 1 capsule (300 mg total) by mouth 3 (three) times daily., Disp: 270 capsule, Rfl: 2   levothyroxine (SYNTHROID) 100 MCG tablet, Take 1 tablet (100 mcg total) by mouth daily., Disp: 45 tablet, Rfl: 3   montelukast (SINGULAIR) 10 MG tablet, Take 10 mg by mouth at bedtime., Disp: , Rfl:    naproxen (NAPROSYN) 500 MG tablet, TAKE 1 TAB BY MOUTH 2 (TWO) TIMES DAILY WITH A MEAL. AS NEEDED FOR MENSTRUAL CRAMPING., Disp: 30 tablet, Rfl: 0   norgestrel-ethinyl estradiol (LOW-OGESTREL) 0.3-30 MG-MCG tablet, TAKE ONE TABLET BY MOUTH DAILY CONTINUOUSLY SKIP SUGAR  PILL WEEK, Disp: 28 tablet, Rfl: 11   Olopatadine HCl 0.6 % SOLN, U 2 SPRAYS IEN BID, Disp: , Rfl: 1   ondansetron (ZOFRAN-ODT) 4 MG disintegrating tablet, Take 1 tablet (4 mg total) by mouth every 8 (eight) hours as needed for nausea or vomiting., Disp: 20 tablet, Rfl: 0   pantoprazole (PROTONIX) 40 MG tablet, Take 1 tablet (40 mg total) by mouth daily., Disp: 90 tablet, Rfl: 0   spironolactone (ALDACTONE) 25 MG tablet, TK 1 T PO BID, Disp: , Rfl: 2   traZODone (DESYREL) 100 MG tablet, Take 1 tablet (100 mg total) by mouth at bedtime., Disp: 90 tablet, Rfl: 2   VRAYLAR 1.5 MG capsule, TAKE 1 CAPSULE BY MOUTH DAILY, Disp: 30 capsule, Rfl: 2 Social History    Socioeconomic History   Marital status: Single    Spouse name: Not on file   Number of children: Not on file   Years of education: Not on file   Highest education level: Not on file  Occupational History   Not on file  Tobacco Use   Smoking status: Never   Smokeless tobacco: Never  Substance and Sexual Activity   Alcohol use: No   Drug use: No   Sexual activity: Never  Other Topics Concern   Not on file  Social History Narrative   Not on file   Social Determinants of Health   Financial Resource Strain: Not on file  Food Insecurity: No Food Insecurity (12/20/2019)   Hunger Vital Sign    Worried About Running Out of Food in the Last Year: Never true    Ran Out of Food in the Last Year: Never true  Transportation Needs: No Transportation Needs (12/20/2019)   PRAPARE - Administrator, Civil Service (Medical): No    Lack of Transportation (Non-Medical): No  Physical Activity: Not on file  Stress: Not on file  Social Connections: Not on file  Intimate Partner Violence: Not on file   Family History  Problem Relation Age of Onset   Diabetes Mother    Bipolar disorder Father    Bipolar disorder Sister    Bipolar disorder Brother    Thyroid disease Brother    Bipolar disorder Brother    Bipolar disorder Brother    Bipolar disorder Maternal Grandmother    Bipolar disorder Paternal Grandmother    Bipolar disorder Paternal Grandfather    Alcohol abuse Paternal Grandfather    Bipolar disorder Paternal Uncle    Alcohol abuse Paternal Uncle     Objective: Office vital signs reviewed. BP 116/76   Pulse 93   Temp 98.5 F (36.9 C)   Ht 4\' 11"  (1.499 m)   Wt 186 lb (84.4 kg)   LMP 08/23/2022 Comment: spotting  SpO2 98%   BMI 37.57 kg/m   Physical Examination:  General: Awake, alert, well nourished, No acute distress HEENT: No thyromegaly or goiter appreciated Cardio: regular rate and rhythm, S1S2 heard, no murmurs appreciated Pulm: clear to auscultation  bilaterally, no wheezes, rhonchi or rales; normal work of breathing on room air GI: soft, non-tender, non-distended, bowel sounds present x4, no hepatomegaly, no splenomegaly, no masses    ssment/ Plan: 27 y.o. female   Gastroesophageal reflux disease without esophagitis - Plan: pantoprazole (PROTONIX) 40 MG tablet  Eye strain - Plan: ondansetron (ZOFRAN-ODT) 4 MG disintegrating tablet  Nausea - Plan: ondansetron (ZOFRAN-ODT) 4 MG disintegrating tablet  PPI working well.  This has been renewed.  Zofran renewed for as needed  use but I highly recommended that she consider use of shades during daytime hours if the dark seems to make the symptoms better so as to reduce risk of dependency on Zofran  No orders of the defined types were placed in this encounter.  No orders of the defined types were placed in this encounter.    Raliegh Ip, DO [facility] [phone]

## 2022-08-27 ENCOUNTER — Other Ambulatory Visit (HOSPITAL_COMMUNITY): Payer: Self-pay | Admitting: Psychiatry

## 2022-08-28 NOTE — Telephone Encounter (Signed)
Call for appt

## 2022-09-11 ENCOUNTER — Other Ambulatory Visit (HOSPITAL_COMMUNITY): Payer: Self-pay | Admitting: Psychiatry

## 2022-09-11 ENCOUNTER — Telehealth (HOSPITAL_COMMUNITY): Payer: Self-pay | Admitting: *Deleted

## 2022-09-11 MED ORDER — TRAZODONE HCL 100 MG PO TABS: 100.0000 mg | ORAL_TABLET | Freq: Every day | ORAL | 2 refills | Status: DC

## 2022-09-11 MED ORDER — AMPHETAMINE-DEXTROAMPHET ER 20 MG PO CP24: 40.0000 mg | ORAL_CAPSULE | ORAL | 0 refills | Status: DC

## 2022-09-11 NOTE — Telephone Encounter (Signed)
Patent mother called to get refills for patient Trazodone and Adderall. Per pt mother she would like it to go to [facility] off of [address]

## 2022-09-13 ENCOUNTER — Telehealth (HOSPITAL_COMMUNITY): Payer: Medicaid Other | Admitting: Psychiatry

## 2022-09-16 ENCOUNTER — Other Ambulatory Visit (HOSPITAL_COMMUNITY): Payer: Self-pay | Admitting: Psychiatry

## 2022-09-16 ENCOUNTER — Telehealth (HOSPITAL_COMMUNITY): Payer: Self-pay

## 2022-09-16 MED ORDER — AMPHETAMINE-DEXTROAMPHETAMINE 10 MG PO TABS: 10.0000 mg | ORAL_TABLET | Freq: Every day | ORAL | 0 refills | Status: DC

## 2022-09-16 NOTE — Telephone Encounter (Signed)
Called pt no answer left vm to check pharmacy 

## 2022-09-16 NOTE — Telephone Encounter (Signed)
Pt called in needing a refill on her amphetamine-dextroamphetamine (ADDERALL) 10 MG tablet  sent to [facility] on [address]. Pt scheduled 09/23/22. Please advise.

## 2022-09-16 NOTE — Progress Notes (Signed)
No show

## 2022-09-23 ENCOUNTER — Encounter (HOSPITAL_COMMUNITY): Payer: Self-pay | Admitting: Psychiatry

## 2022-09-23 ENCOUNTER — Telehealth (INDEPENDENT_AMBULATORY_CARE_PROVIDER_SITE_OTHER): Payer: Medicaid Other | Admitting: Psychiatry

## 2022-09-23 DIAGNOSIS — F3162 Bipolar disorder, current episode mixed, moderate: Secondary | ICD-10-CM

## 2022-09-23 DIAGNOSIS — F988 Other specified behavioral and emotional disorders with onset usually occurring in childhood and adolescence: Secondary | ICD-10-CM

## 2022-09-23 DIAGNOSIS — F331 Major depressive disorder, recurrent, moderate: Secondary | ICD-10-CM

## 2022-09-23 MED ORDER — DIVALPROEX SODIUM ER 500 MG PO TB24: ORAL_TABLET | ORAL | 2 refills | Status: DC

## 2022-09-23 MED ORDER — AMPHETAMINE-DEXTROAMPHETAMINE 10 MG PO TABS: 10.0000 mg | ORAL_TABLET | Freq: Every day | ORAL | 0 refills | Status: DC

## 2022-09-23 MED ORDER — AMPHETAMINE-DEXTROAMPHET ER 20 MG PO CP24: 40.0000 mg | ORAL_CAPSULE | ORAL | 0 refills | Status: DC

## 2022-09-23 MED ORDER — CARIPRAZINE HCL 1.5 MG PO CAPS: 1.5000 mg | ORAL_CAPSULE | Freq: Every day | ORAL | 2 refills | Status: DC

## 2022-09-23 MED ORDER — TRAZODONE HCL 100 MG PO TABS: 100.0000 mg | ORAL_TABLET | Freq: Every day | ORAL | 2 refills | Status: DC

## 2022-09-23 MED ORDER — BUPROPION HCL ER (XL) 300 MG PO TB24: 300.0000 mg | ORAL_TABLET | ORAL | 2 refills | Status: DC

## 2022-09-23 MED ORDER — BUPROPION HCL ER (XL) 150 MG PO TB24: ORAL_TABLET | ORAL | 2 refills | Status: DC

## 2022-09-23 MED ORDER — CLONAZEPAM 0.5 MG PO TABS: 0.5000 mg | ORAL_TABLET | Freq: Two times a day (BID) | ORAL | 2 refills | Status: DC | PRN

## 2022-09-23 MED ORDER — GABAPENTIN 300 MG PO CAPS: 300.0000 mg | ORAL_CAPSULE | Freq: Three times a day (TID) | ORAL | 2 refills | Status: DC

## 2022-09-23 NOTE — Progress Notes (Signed)
Virtual Visit via Video Note  I connected with Samantha Tucker on 09/23/22 at  2:20 PM EDT by a video enabled telemedicine application and verified that I am speaking with the correct person using two identifiers.  Location: Patient: home Provider: office   I discussed the limitations of evaluation and management by telemedicine and the availability of in person appointments. The patient expressed understanding and agreed to proceed.      I discussed the assessment and treatment plan with the patient. The patient was provided an opportunity to ask questions and all were answered. The patient agreed with the plan and demonstrated an understanding of the instructions.   The patient was advised to call back or seek an in-person evaluation if the symptoms worsen or if the condition fails to improve as anticipated.  I provided 15 minutes of non-face-to-face time during this encounter.   Diannia Ruder, MD  [facility] MD/PA/NP OP Progress Note  09/23/2022 2:31 PM Samantha Tucker  MRN:  [id number]  Chief Complaint:  Chief Complaint  Patient presents with   Depression   Anxiety   Follow-up   ADHD   HPI: This patient is a 27 year-old single white female who lives with her family in [place].  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  She states overall she has been doing well.  She finished her associates degree.  She is now still at [employer] taking prerequisites to transfer to a 4-year college.  She wants to do criminal forensics.  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  The patient states that she has not had significant depression or anxiety.  She is able to stay focused through the day on both work and school.  She is sleeping well at night.  She is not having any panic attacks.  She denies thoughts of suicide or self-harm.  She is sleeping well. Visit  Diagnosis:    ICD-10-CM   1. Major depressive disorder, recurrent episode, moderate (HCC)  F33.1     2. ADD (attention deficit disorder) without hyperactivity  F98.8     3. Moderate mixed bipolar I disorder (HCC)  F31.62       Past Psychiatric History: Long-term outpatient treatment  Past Medical History:  Past Medical History:  Diagnosis Date   ADHD (attention deficit hyperactivity disorder)    Allergy    Anxiety    Constipation    Environmental allergies    Nausea    Postural hypotension     Past Surgical History:  Procedure Laterality Date   TYMPANOSTOMY TUBE PLACEMENT Bilateral [id number]    Family Psychiatric History: See below  Family History:  Family History  Problem Relation Age of Onset   Diabetes Mother    Bipolar disorder Father    Bipolar disorder Sister    Bipolar disorder Brother    Thyroid disease Brother    Bipolar disorder Brother    Bipolar disorder Brother    Bipolar disorder Maternal Grandmother    Bipolar disorder Paternal Grandmother    Bipolar disorder Paternal Grandfather    Alcohol abuse Paternal Grandfather    Bipolar disorder Paternal Uncle    Alcohol abuse Paternal Uncle     Social History:  Social History   Socioeconomic History   Marital status: Single    Spouse name: Not on file  Number of children: Not on file   Years of education: Not on file   Highest education level: Not on file  Occupational History   Not on file  Tobacco Use   Smoking status: Never   Smokeless tobacco: Never  Substance and Sexual Activity   Alcohol use: No   Drug use: No   Sexual activity: Never  Other Topics Concern   Not on file  Social History Narrative   Not on file   Social Determinants of Health   Financial Resource Strain: Not on file  Food Insecurity: No Food Insecurity (12/20/2019)   Hunger Vital Sign    Worried About Running Out of Food in the Last Year: Never true    Ran Out of Food in the Last Year: Never true  Transportation  Needs: No Transportation Needs (12/20/2019)   PRAPARE - Administrator, Civil Service (Medical): No    Lack of Transportation (Non-Medical): No  Physical Activity: Not on file  Stress: Not on file  Social Connections: Not on file    Allergies:  Allergies  Allergen Reactions   Other Hives and Shortness Of Breath    Develops hives with exposure to cold air.    Claritin [Loratadine]     Fainting spells   Sulfa Antibiotics    Latex Hives    Metabolic Disorder Labs: Lab Results  Component Value Date   HGBA1C 5.7 (H) 05/24/2022   Lab Results  Component Value Date   PROLACTIN 17.7 10/17/2021   No results found for: "CHOL", "TRIG", "HDL", "CHOLHDL", "VLDL", "LDLCALC" Lab Results  Component Value Date   TSH 3.28 07/18/2022   TSH 18.500 (H) 05/24/2022    Therapeutic Level Labs: No results found for: "LITHIUM" Lab Results  Component Value Date   VALPROATE 60.0 08/18/2020   VALPROATE 74.5 01/26/2019   No results found for: "CBMZ"  Current Medications: Current Outpatient Medications  Medication Sig Dispense Refill   amphetamine-dextroamphetamine (ADDERALL) 10 MG tablet Take 1 tablet (10 mg total) by mouth daily. 30 tablet 0   albuterol (PROVENTIL HFA;VENTOLIN HFA) 108 (90 Base) MCG/ACT inhaler Inhale 2 puffs into the lungs every 6 (six) hours as needed for wheezing or shortness of breath. 1 Inhaler 0   amphetamine-dextroamphetamine (ADDERALL XR) 20 MG 24 hr capsule Take 2 capsules (40 mg total) by mouth every morning. 60 capsule 0   amphetamine-dextroamphetamine (ADDERALL XR) 20 MG 24 hr capsule Take 2 capsules (40 mg total) by mouth every morning. 60 capsule 0   amphetamine-dextroamphetamine (ADDERALL XR) 20 MG 24 hr capsule Take 2 capsules (40 mg total) by mouth every morning. 60 capsule 0   amphetamine-dextroamphetamine (ADDERALL) 10 MG tablet Take 1 tablet (10 mg total) by mouth daily. 30 tablet 0   amphetamine-dextroamphetamine (ADDERALL) 10 MG tablet Take 1  tablet (10 mg total) by mouth daily. 30 tablet 0   buPROPion (WELLBUTRIN XL) 150 MG 24 hr tablet TAKE ONE TABLET BY MOUTH DAILY WITH 300MG  TO EQUAL 450MG  DAILY DOSE 30 tablet 2   buPROPion (WELLBUTRIN XL) 300 MG 24 hr tablet Take 1 tablet (300 mg total) by mouth every morning. 90 tablet 2   clindamycin-benzoyl peroxide (BENZACLIN) gel   3   clonazePAM (KLONOPIN) 0.5 MG tablet Take 1 tablet (0.5 mg total) by mouth 2 (two) times daily as needed for anxiety. 60 tablet 2   desloratadine (CLARINEX) 5 MG tablet Take 5 mg by mouth daily.     divalproex (DEPAKOTE ER) 500 MG 24 hr  tablet TAKE TWO TABLETS BY MOUTH EVERY NIGHT AT BEDTIME 60 tablet 2   fluticasone (FLONASE) 50 MCG/ACT nasal spray Place 2 sprays into both nostrils daily. 16 g 4   gabapentin (NEURONTIN) 300 MG capsule Take 1 capsule (300 mg total) by mouth 3 (three) times daily. 270 capsule 2   levothyroxine (SYNTHROID) 100 MCG tablet Take 1 tablet (100 mcg total) by mouth daily. 45 tablet 3   montelukast (SINGULAIR) 10 MG tablet Take 10 mg by mouth at bedtime.     naproxen (NAPROSYN) 500 MG tablet TAKE 1 TAB BY MOUTH 2 (TWO) TIMES DAILY WITH A MEAL. AS NEEDED FOR MENSTRUAL CRAMPING. 30 tablet 0   norgestrel-ethinyl estradiol (LOW-OGESTREL) 0.3-30 MG-MCG tablet TAKE ONE TABLET BY MOUTH DAILY CONTINUOUSLY SKIP SUGAR PILL WEEK 28 tablet 11   Olopatadine HCl 0.6 % SOLN U 2 SPRAYS IEN BID  1   ondansetron (ZOFRAN-ODT) 4 MG disintegrating tablet Take 1 tablet (4 mg total) by mouth every 8 (eight) hours as needed for nausea or vomiting. 20 tablet 1   pantoprazole (PROTONIX) 40 MG tablet Take 1 tablet (40 mg total) by mouth daily. 90 tablet 3   spironolactone (ALDACTONE) 25 MG tablet TK 1 T PO BID  2   traZODone (DESYREL) 100 MG tablet Take 1 tablet (100 mg total) by mouth at bedtime. 90 tablet 2   VRAYLAR 1.5 MG capsule TAKE 1 CAPSULE BY MOUTH DAILY 30 capsule 2   No current facility-administered medications for this visit.      Musculoskeletal: Strength & Muscle Tone: within normal limits Gait & Station: normal Patient leans: N/A  Psychiatric Specialty Exam: Review of Systems  All other systems reviewed and are negative.   There were no vitals taken for this visit.There is no height or weight on file to calculate BMI.  General Appearance: Casual and Fairly Groomed  Eye Contact:  Good  Speech:  Clear and Coherent  Volume:  Normal  Mood:  Euthymic  Affect:  Congruent  Thought Process:  Goal Directed  Orientation:  Full (Time, Place, and Person)  Thought Content: WDL   Suicidal Thoughts:  No  Homicidal Thoughts:  No  Memory:  Immediate;   Good Recent;   Good Remote;   Good  Judgement:  Good  Insight:  Fair  Psychomotor Activity:  Normal  Concentration:  Concentration: Good and Attention Span: Good  Recall:  Good  Fund of Knowledge: Good  Language: Good  Akathisia:  No  Handed:  Right  AIMS (if indicated): not done  Assets:  Communication Skills Desire for Improvement Physical Health Resilience Social Support Talents/Skills  ADL's:  Intact  Cognition: WNL  Sleep:  Good   Screenings: GAD-7    Flowsheet Row Office Visit from 08/23/2022 in [facility] Office Visit from 05/24/2022 in [facility] Office Visit from 07/05/2021 in [facility] Office Visit from 12/20/2019 in [facility] for [facility] Office Visit from 04/21/2019 in [facility]  Total GAD-7 Score 0 0 1 3 2       PHQ2-9    Flowsheet Row Office Visit from 08/23/2022 in [facility] Office Visit from 05/24/2022 in [facility] Video Visit from 01/10/2022 in [facility] [place] Video Visit from 08/03/2021 in [facility]  [facility] Visit from  07/05/2021 in Center for [facility] at [facility]  PHQ-2 Total Score 0 0 0 0 1  PHQ-9 Total Score 0 -- -- -- 2      Flowsheet Row Video Visit from 01/10/2022 in [facility] [place] Video Visit from 08/03/2021 in [facility] at [place] Video Visit from 09/28/2020 in [facility] at [place]  C-SSRS RISK CATEGORY No Risk No Risk Error: Question 6 not populated        Assessment and Plan: This patient is a 27 year old female with a history of depression anger irritability possibly mixed bipolar disorder, ADHD and insomnia.  She is doing well on her current regimen.  She will continue Vraylar 1.5 mg daily for bipolar depression, Depakote ER 1000 mg at bedtime also for bipolar disorder, gabapentin 300 mg 3 times daily for anxiety, clonazepam 0.5 mg twice daily only as needed for anxiety, trazodone 100 mg at bedtime for sleep, Wellbutrin XL 450 mg daily for depression, Adderall XR 40 mg every morning for ADD and Adderall 10 mg later in the day also for ADD.  She will return to see me in 3 months  Collaboration of Care: Collaboration of Care: Other provider involved in patient's care AEB notes are shared with endocrinology through the epic system  Patient/Guardian was advised Release of Information must be obtained prior to any record release in order to collaborate their care with an outside provider. Patient/Guardian was advised if they have not already done so to contact the registration department to sign all necessary forms in order for [place] to release information regarding their care.   Consent: Patient/Guardian gives verbal consent for treatment and assignment of benefits for services provided during this visit. Patient/Guardian expressed understanding and agreed to proceed.    Diannia Ruder, MD 09/23/2022, 2:31 PM

## 2022-10-04 ENCOUNTER — Encounter: Payer: Self-pay | Admitting: Internal Medicine

## 2022-10-04 ENCOUNTER — Ambulatory Visit: Payer: Medicaid Other | Admitting: Internal Medicine

## 2022-10-04 VITALS — BP 130/78 | HR 94 | Ht 59.0 in | Wt 180.2 lb

## 2022-10-04 DIAGNOSIS — E669 Obesity, unspecified: Secondary | ICD-10-CM

## 2022-10-04 DIAGNOSIS — E039 Hypothyroidism, unspecified: Secondary | ICD-10-CM | POA: Diagnosis not present

## 2022-10-04 LAB — T4, FREE: Free T4: 1.13 ng/dL (ref 0.60–1.60)

## 2022-10-04 LAB — TSH: TSH: 3.3 u[IU]/mL (ref 0.35–5.50)

## 2022-10-04 MED ORDER — LEVOTHYROXINE SODIUM 100 MCG PO TABS: 100.0000 ug | ORAL_TABLET | Freq: Every day | ORAL | 3 refills | Status: DC

## 2022-10-04 NOTE — Patient Instructions (Signed)
Please continue levothyroxine 100 mcg daily.   Take the thyroid hormone every day, with water, at least 30 minutes before breakfast, separated by at least 4 hours from: - acid reflux medications - calcium - iron - multivitamins   Please stop at the lab.   Please return for another visit in 6 months.

## 2022-10-04 NOTE — Progress Notes (Signed)
Patient ID: Samantha Tucker, female   DOB: 1996-02-09, 27 y.o.   MRN: [id number]  HPI  Samantha Tucker is a 27 y.o.-year-old female, initially referred by her PCP, Dr. Nadine Tucker, returning for follow-up for Hashimoto's hypothyroidism.  She previously saw Samantha Tucker and then Dr. Lucianne Tucker, but last visit with me 4 months ago.  She is here with her mother, Samantha Tucker, who is also my patient.  She offers part of the history including PMH, thyroid medication doses, symptoms.  Interim history: Since last visit, she was able to lose 22 pounds. She feels better.  Reviewed and addended history: Pt. has been dx with hypothyroidism in 2019 - irregular menses, low AP, mood swings.  She was started on Levothyroxine.  She started to feel better, however, she reports eating a significant amount of weight since then, per our chart approximately 60 pounds.  She gained approximately 23 pounds in 2023.   At our first visit in 05/2022, we increased her LT4 dose.  Pt. is on levothyroxine 100 mcg daily, taken: - in am - fasting - previously 30 min from b'fast, now skipping b'fast  - no calcium - no iron - no multivitamins - she started  PPIs (Protonix) - 05/2022, prev. Pepcid for a long time - 30 min -1h after LT4 >> now taking Protonix later in the day - not on Biotin  I reviewed pt's thyroid tests: Lab Results  Component Value Date   TSH 3.28 07/18/2022   TSH 18.500 (H) 05/24/2022   TSH 4.43 10/17/2021   TSH 4.900 (H) 03/16/2021   TSH 3.04 10/06/2020   TSH 4.150 04/21/2019   TSH 6.39 (H) 01/22/2019   TSH 5.00 07/20/2018   TSH 4.790 (H) 02/17/2018   TSH 3.08 01/14/2018   FREET4 1.06 07/18/2022   FREET4 1.01 05/24/2022   FREET4 0.80 10/17/2021   FREET4 1.26 03/16/2021   FREET4 0.87 10/06/2020   FREET4 0.88 01/22/2019   FREET4 0.73 07/20/2018   FREET4 1.33 08/22/2017   FREET4 1.07 07/22/2017   Antithyroid antibodies were positive at last visit: Component     Latest Ref Rng 07/18/2022  Thyroglobulin Ab      < or = 1 IU/mL 12 (H)   Thyroperoxidase Ab SerPl-aCnc     <9 IU/mL 325 (H)    Pt denies: - feeling nodules in neck - hoarseness - dysphagia She has choking with GERD sxs.  She has + FH of thyroid disorders in: Brother. No FH of thyroid cancer. No h/o radiation tx to head or neck. No recent use of iodine supplements.  No herbal supplements.  No recent steroid use.  Pt. also has a history of suspected PCOS:  Reviewed previous investigation:  - Normal pelvic ultrasound 10/08/2018 (ordered by OB/GYN-Dr. Marice Tucker): Uterus: 8.3 x 3.4 x 4.3 cm = volume: 63.4 mL. No fibroids or other mass visualized. Endometrium Thickness: 4.1 mm.  Normal trilaminar appearance Right ovary: 4.1 x 2.1 x 2.0 cm = volume: 9.1 mL. Corpus luteum noted. Left ovary: 2.5 x 1.2 x 1.6 cm = volume: 2.6 mL. Normal appearance/no adnexal mass. Other findings:  No abnormal free fluid   IMPRESSION: 1. Normal pelvic sonogram.  - Normal ACTH and cortisol levels in 03/2021: Component     Latest Ref Rng 03/16/2021  Cortisol, Plasma     mcg/dL 19.1   Y782 ACTH     6 - 50 pg/mL 15    - Normal testosterone and prolactin levels in 10/2021 - Dr. Lucianne Tucker advised her that  she likely did not have PCOS and was not a candidate for metformin.  He recommended a referral to the Weight management clinic. Component     Latest Ref Rng 10/17/2021  Testosterone     13 - 71 ng/dL 26   Testosterone Free     0.0 - 4.2 pg/mL 1.5   Sex Horm Binding Glob, Serum     24.6 - 122.0 nmol/L 26.4   Prolactin     4.8 - 23.3 ng/mL 17.7    She is on spironolactone.  She has a h/o POTS - dx'ed 2014 - now controlled.  ROS: + see HPI  Past Medical History:  Diagnosis Date   ADHD (attention deficit hyperactivity disorder)    Allergy    Anxiety    Constipation    Environmental allergies    Nausea    Postural hypotension    Past Surgical History:  Procedure Laterality Date   TYMPANOSTOMY TUBE PLACEMENT Bilateral [id number]   Social History    Socioeconomic History   Marital status: Single    Spouse name: Not on file   Number of children: Not on file   Years of education: Not on file   Highest education level: Not on file  Occupational History   Not on file  Tobacco Use   Smoking status: Never   Smokeless tobacco: Never  Substance and Sexual Activity   Alcohol use: No   Drug use: No   Sexual activity: Never  Other Topics Concern   Not on file  Social History Narrative   Not on file   Social Determinants of Health   Financial Resource Strain: Not on file  Food Insecurity: No Food Insecurity (12/20/2019)   Hunger Vital Sign    Worried About Running Out of Food in the Last Year: Never true    Ran Out of Food in the Last Year: Never true  Transportation Needs: No Transportation Needs (12/20/2019)   PRAPARE - Administrator, Civil Service (Medical): No    Lack of Transportation (Non-Medical): No  Physical Activity: Not on file  Stress: Not on file  Social Connections: Not on file  Intimate Partner Violence: Not on file   Current Outpatient Medications on File Prior to Visit  Medication Sig Dispense Refill   albuterol (PROVENTIL HFA;VENTOLIN HFA) 108 (90 Base) MCG/ACT inhaler Inhale 2 puffs into the lungs every 6 (six) hours as needed for wheezing or shortness of breath. 1 Inhaler 0   amphetamine-dextroamphetamine (ADDERALL XR) 20 MG 24 hr capsule Take 2 capsules (40 mg total) by mouth every morning. 60 capsule 0   amphetamine-dextroamphetamine (ADDERALL XR) 20 MG 24 hr capsule Take 2 capsules (40 mg total) by mouth every morning. 60 capsule 0   amphetamine-dextroamphetamine (ADDERALL XR) 20 MG 24 hr capsule Take 2 capsules (40 mg total) by mouth every morning. 60 capsule 0   amphetamine-dextroamphetamine (ADDERALL) 10 MG tablet Take 1 tablet (10 mg total) by mouth daily. 30 tablet 0   amphetamine-dextroamphetamine (ADDERALL) 10 MG tablet Take 1 tablet (10 mg total) by mouth daily. 30 tablet 0    amphetamine-dextroamphetamine (ADDERALL) 10 MG tablet Take 1 tablet (10 mg total) by mouth daily. 30 tablet 0   buPROPion (WELLBUTRIN XL) 150 MG 24 hr tablet TAKE ONE TABLET BY MOUTH DAILY WITH 300MG  TO EQUAL 450MG  DAILY DOSE 90 tablet 2   buPROPion (WELLBUTRIN XL) 300 MG 24 hr tablet Take 1 tablet (300 mg total) by mouth every morning. 90 tablet 2  cariprazine (VRAYLAR) 1.5 MG capsule Take 1 capsule (1.5 mg total) by mouth daily. 30 capsule 2   clindamycin-benzoyl peroxide (BENZACLIN) gel   3   clonazePAM (KLONOPIN) 0.5 MG tablet Take 1 tablet (0.5 mg total) by mouth 2 (two) times daily as needed for anxiety. 60 tablet 2   desloratadine (CLARINEX) 5 MG tablet Take 5 mg by mouth daily.     divalproex (DEPAKOTE ER) 500 MG 24 hr tablet TAKE TWO TABLETS BY MOUTH EVERY NIGHT AT BEDTIME 60 tablet 2   fluticasone (FLONASE) 50 MCG/ACT nasal spray Place 2 sprays into both nostrils daily. 16 g 4   gabapentin (NEURONTIN) 300 MG capsule Take 1 capsule (300 mg total) by mouth 3 (three) times daily. 270 capsule 2   levothyroxine (SYNTHROID) 100 MCG tablet Take 1 tablet (100 mcg total) by mouth daily. 45 tablet 3   montelukast (SINGULAIR) 10 MG tablet Take 10 mg by mouth at bedtime.     naproxen (NAPROSYN) 500 MG tablet TAKE 1 TAB BY MOUTH 2 (TWO) TIMES DAILY WITH A MEAL. AS NEEDED FOR MENSTRUAL CRAMPING. 30 tablet 0   norgestrel-ethinyl estradiol (LOW-OGESTREL) 0.3-30 MG-MCG tablet TAKE ONE TABLET BY MOUTH DAILY CONTINUOUSLY SKIP SUGAR PILL WEEK 28 tablet 11   Olopatadine HCl 0.6 % SOLN U 2 SPRAYS IEN BID  1   ondansetron (ZOFRAN-ODT) 4 MG disintegrating tablet Take 1 tablet (4 mg total) by mouth every 8 (eight) hours as needed for nausea or vomiting. 20 tablet 1   pantoprazole (PROTONIX) 40 MG tablet Take 1 tablet (40 mg total) by mouth daily. 90 tablet 3   spironolactone (ALDACTONE) 25 MG tablet TK 1 T PO BID  2   traZODone (DESYREL) 100 MG tablet Take 1 tablet (100 mg total) by mouth at bedtime. 90 tablet 2    No current facility-administered medications on file prior to visit.   Allergies  Allergen Reactions   Other Hives and Shortness Of Breath    Develops hives with exposure to cold air.    Claritin [Loratadine]     Fainting spells   Sulfa Antibiotics    Latex Hives   Family History  Problem Relation Age of Onset   Diabetes Mother    Bipolar disorder Father    Bipolar disorder Sister    Bipolar disorder Brother    Thyroid disease Brother    Bipolar disorder Brother    Bipolar disorder Brother    Bipolar disorder Maternal Grandmother    Bipolar disorder Paternal Grandmother    Bipolar disorder Paternal Grandfather    Alcohol abuse Paternal Grandfather    Bipolar disorder Paternal Uncle    Alcohol abuse Paternal Uncle    PE: BP 130/78 (BP Location: Right Arm, Patient Position: Sitting, Cuff Size: Normal)   Pulse 94   Ht 4\' 11"  (1.499 m)   Wt 180 lb 3.2 oz (81.7 kg)   SpO2 99%   BMI 36.40 kg/m  Wt Readings from Last 3 Encounters:  10/04/22 180 lb 3.2 oz (81.7 kg)  08/23/22 186 lb (84.4 kg)  06/07/22 202 lb (91.6 kg)   Constitutional: overweight, in NAD Eyes:  EOMI, no exophthalmos ENT: no neck masses, no cervical lymphadenopathy Cardiovascular: tachycardia, RR, No MRG Respiratory: CTA B Musculoskeletal: no deformities Skin:no rashes Neurological: no tremor with outstretched hands  ASSESSMENT: 1.  Hashimoto's hypothyroidism  2.  Obesity class 2  PLAN:  1. 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  We discussed that this is a autoimmune disease and treatment is still with levothyroxine - latest thyroid labs reviewed with pt. >> normal: Lab Results  Component Value Date   TSH 3.28 07/18/2022  - she continues on LT4 100 mcg daily, dose increased at last visit - pt feels  good on this dose.  She was able to lose 16 pounds since last visit. - we discussed about taking the thyroid hormone every day, with water, >30 minutes before breakfast, separated by >4 hours from acid reflux medications, calcium, iron, multivitamins. Pt. is taking it correctly now.  At last visit, she was taking acid reflux medication closer to levothyroxine and we discussed about taking levothyroxine first, putting it on the nightstand and taking Protonix later in the day.  I also advised her that if she missed a dose to take 2 tablets the next day. - will check thyroid tests today: TSH and fT4 - If labs are abnormal, she will need to return for repeat TFTs in 1.5 months - I we will see her back in 6 months  2.  Obesity class 2 -At last visit, she was in the obesity class III category -At last visit she reported a 23 pound weight gain in the previous year, but approximately 60 pounds since her diagnosis of hypothyroidism -Reviewing previous investigation, there was no clear indication of PCOS (transvaginal ultrasound and testosterone levels were normal), cortisol and ACTH levels were normal, prolactin level was also normal -She has a history of depression, which can contribute -Also, her thyroid tests were fluctuating, mostly above target, which can also cause weight gain -At last visit, we increased her levothyroxine dose and advised her how to take it correctly -She lost 22 pounds since then  Patient Instructions  Please continue levothyroxine 100 mcg daily.  Take the thyroid hormone every day, with water, at least 30 minutes before breakfast, separated by at least 4 hours from: - acid reflux medications - calcium - iron - multivitamins  Please stop at the lab.  Please return for another visit in 6 months.  Needs 90 day refills.  Component     Latest Ref Rng 10/04/2022  TSH     0.35 - 5.50 uIU/mL 3.30   T4,Free(Direct)     0.60 - 1.60 ng/dL 0.98   Thyroid tests remain normal.   I will refill the same dose of levothyroxine.  Carlus Pavlov, MD PhD [facility]

## 2022-10-14 ENCOUNTER — Other Ambulatory Visit: Payer: Self-pay | Admitting: *Deleted

## 2022-10-14 DIAGNOSIS — Z3009 Encounter for other general counseling and advice on contraception: Secondary | ICD-10-CM

## 2022-10-14 NOTE — Addendum Note (Signed)
Addended by: Julious Payer D on: 10/14/2022 02:47 PM   Modules accepted: Orders

## 2022-10-14 NOTE — Telephone Encounter (Signed)
She was seeing OB/GYN for this.  Is she no longer seeing them?  She is overdue for pap smear.

## 2022-10-14 NOTE — Telephone Encounter (Signed)
[facility] med RF visit, last OV for Dx 2019 NO RF sent

## 2022-10-14 NOTE — Telephone Encounter (Signed)
Made appt for first available = July 30

## 2022-10-18 ENCOUNTER — Encounter: Payer: Self-pay | Admitting: Family Medicine

## 2022-11-18 ENCOUNTER — Other Ambulatory Visit: Payer: Self-pay | Admitting: *Deleted

## 2022-11-18 DIAGNOSIS — Z3009 Encounter for other general counseling and advice on contraception: Secondary | ICD-10-CM

## 2022-11-18 MED ORDER — LOW-OGESTREL 0.3-30 MG-MCG PO TABS
ORAL_TABLET | ORAL | 6 refills | Status: DC
Start: 2022-11-18 — End: 2022-12-10

## 2022-12-10 ENCOUNTER — Ambulatory Visit: Payer: Medicaid Other | Admitting: Family Medicine

## 2022-12-10 ENCOUNTER — Encounter: Payer: Self-pay | Admitting: Family Medicine

## 2022-12-10 VITALS — BP 117/76 | HR 77 | Temp 98.7°F | Ht 59.0 in | Wt 177.0 lb

## 2022-12-10 DIAGNOSIS — Z3041 Encounter for surveillance of contraceptive pills: Secondary | ICD-10-CM | POA: Diagnosis not present

## 2022-12-10 DIAGNOSIS — J3089 Other allergic rhinitis: Secondary | ICD-10-CM

## 2022-12-10 DIAGNOSIS — L700 Acne vulgaris: Secondary | ICD-10-CM | POA: Diagnosis not present

## 2022-12-10 MED ORDER — FLUTICASONE PROPIONATE 50 MCG/ACT NA SUSP
2.0000 | Freq: Every day | NASAL | 4 refills | Status: DC
Start: 2022-12-10 — End: 2023-12-12

## 2022-12-10 MED ORDER — CLINDAMYCIN PHOS-BENZOYL PEROX 1-5 % EX GEL: Freq: Two times a day (BID) | CUTANEOUS | 3 refills | Status: DC

## 2022-12-10 MED ORDER — OLOPATADINE HCL 0.6 % NA SOLN: 2.0000 | Freq: Two times a day (BID) | NASAL | 3 refills | Status: AC

## 2022-12-10 MED ORDER — DESLORATADINE 5 MG PO TABS: 5.0000 mg | ORAL_TABLET | Freq: Every day | ORAL | 3 refills | Status: DC

## 2022-12-10 MED ORDER — LOW-OGESTREL 0.3-30 MG-MCG PO TABS: ORAL_TABLET | ORAL | 99 refills | Status: DC

## 2022-12-10 MED ORDER — MONTELUKAST SODIUM 10 MG PO TABS
10.0000 mg | ORAL_TABLET | Freq: Every day | ORAL | 3 refills | Status: DC
Start: 2022-12-10 — End: 2023-12-08

## 2022-12-10 NOTE — Progress Notes (Signed)
Subjective: CC: Follow-up dysmenorrhea PCP: Raliegh Ip, DO Samantha Tucker is a 27 y.o. female presenting to clinic today for:  1.  Dysmenorrhea This has been treated with oral contraceptive.  Currently previously prescribed by OB/GYN.  She reports that this works well for both cramping and menstrual cycles.  She is on a continuous OCP and this has worked wonders  2.  Acne Reports BenzaClin has been helpful for acne and would like to get this renewed  3.  Allergic rhinitis Patient reports allergic rhinitis responds well to current modalities of treatment and needs refills on everything   ROS: Per HPI  Allergies  Allergen Reactions   Other Hives and Shortness Of Breath    Develops hives with exposure to cold air.    Claritin [Loratadine]     Fainting spells   Sulfa Antibiotics    Latex Hives   Past Medical History:  Diagnosis Date   ADHD (attention deficit hyperactivity disorder)    Allergy    Anxiety    Constipation    Environmental allergies    Nausea    Postural hypotension     Current Outpatient Medications:    albuterol (PROVENTIL HFA;VENTOLIN HFA) 108 (90 Base) MCG/ACT inhaler, Inhale 2 puffs into the lungs every 6 (six) hours as needed for wheezing or shortness of breath., Disp: 1 Inhaler, Rfl: 0   amoxicillin (AMOXIL) 500 MG capsule, Take 500 mg by mouth 3 (three) times daily., Disp: , Rfl:    amphetamine-dextroamphetamine (ADDERALL XR) 20 MG 24 hr capsule, Take 2 capsules (40 mg total) by mouth every morning., Disp: 60 capsule, Rfl: 0   amphetamine-dextroamphetamine (ADDERALL XR) 20 MG 24 hr capsule, Take 2 capsules (40 mg total) by mouth every morning., Disp: 60 capsule, Rfl: 0   amphetamine-dextroamphetamine (ADDERALL XR) 20 MG 24 hr capsule, Take 2 capsules (40 mg total) by mouth every morning., Disp: 60 capsule, Rfl: 0   amphetamine-dextroamphetamine (ADDERALL) 10 MG tablet, Take 1 tablet (10 mg total) by mouth daily., Disp: 30 tablet, Rfl: 0    amphetamine-dextroamphetamine (ADDERALL) 10 MG tablet, Take 1 tablet (10 mg total) by mouth daily., Disp: 30 tablet, Rfl: 0   amphetamine-dextroamphetamine (ADDERALL) 10 MG tablet, Take 1 tablet (10 mg total) by mouth daily., Disp: 30 tablet, Rfl: 0   buPROPion (WELLBUTRIN XL) 150 MG 24 hr tablet, TAKE ONE TABLET BY MOUTH DAILY WITH 300MG  TO EQUAL 450MG  DAILY DOSE, Disp: 90 tablet, Rfl: 2   buPROPion (WELLBUTRIN XL) 300 MG 24 hr tablet, Take 1 tablet (300 mg total) by mouth every morning., Disp: 90 tablet, Rfl: 2   cariprazine (VRAYLAR) 1.5 MG capsule, Take 1 capsule (1.5 mg total) by mouth daily., Disp: 30 capsule, Rfl: 2   clindamycin-benzoyl peroxide (BENZACLIN) gel, , Disp: , Rfl: 3   clonazePAM (KLONOPIN) 0.5 MG tablet, Take 1 tablet (0.5 mg total) by mouth 2 (two) times daily as needed for anxiety., Disp: 60 tablet, Rfl: 2   desloratadine (CLARINEX) 5 MG tablet, Take 5 mg by mouth daily., Disp: , Rfl:    divalproex (DEPAKOTE ER) 500 MG 24 hr tablet, TAKE TWO TABLETS BY MOUTH EVERY NIGHT AT BEDTIME, Disp: 60 tablet, Rfl: 2   fluticasone (FLONASE) 50 MCG/ACT nasal spray, Place 2 sprays into both nostrils daily., Disp: 16 g, Rfl: 4   gabapentin (NEURONTIN) 300 MG capsule, Take 1 capsule (300 mg total) by mouth 3 (three) times daily., Disp: 270 capsule, Rfl: 2   levothyroxine (SYNTHROID) 100 MCG tablet, Take  1 tablet (100 mcg total) by mouth daily., Disp: 90 tablet, Rfl: 3   montelukast (SINGULAIR) 10 MG tablet, Take 10 mg by mouth at bedtime., Disp: , Rfl:    naproxen (NAPROSYN) 500 MG tablet, TAKE 1 TAB BY MOUTH 2 (TWO) TIMES DAILY WITH A MEAL. AS NEEDED FOR MENSTRUAL CRAMPING., Disp: 30 tablet, Rfl: 0   norgestrel-ethinyl estradiol (LOW-OGESTREL) 0.3-30 MG-MCG tablet, TAKE ONE TABLET BY MOUTH DAILY CONTINUOUSLY SKIP SUGAR PILL WEEK, Disp: 28 tablet, Rfl: 6   Olopatadine HCl 0.6 % SOLN, U 2 SPRAYS IEN BID, Disp: , Rfl: 1   ondansetron (ZOFRAN-ODT) 4 MG disintegrating tablet, Take 1 tablet (4 mg  total) by mouth every 8 (eight) hours as needed for nausea or vomiting., Disp: 20 tablet, Rfl: 1   pantoprazole (PROTONIX) 40 MG tablet, Take 1 tablet (40 mg total) by mouth daily., Disp: 90 tablet, Rfl: 3   spironolactone (ALDACTONE) 25 MG tablet, TK 1 T PO BID, Disp: , Rfl: 2   traZODone (DESYREL) 100 MG tablet, Take 1 tablet (100 mg total) by mouth at bedtime., Disp: 90 tablet, Rfl: 2 Social History   Socioeconomic History   Marital status: Single    Spouse name: Not on file   Number of children: Not on file   Years of education: Not on file   Highest education level: Not on file  Occupational History   Not on file  Tobacco Use   Smoking status: Never   Smokeless tobacco: Never  Substance and Sexual Activity   Alcohol use: No   Drug use: No   Sexual activity: Never  Other Topics Concern   Not on file  Social History Narrative   Not on file   Social Determinants of Health   Financial Resource Strain: Not on file  Food Insecurity: No Food Insecurity (12/20/2019)   Hunger Vital Sign    Worried About Running Out of Food in the Last Year: Never true    Ran Out of Food in the Last Year: Never true  Transportation Needs: No Transportation Needs (12/20/2019)   PRAPARE - Administrator, Civil Service (Medical): No    Lack of Transportation (Non-Medical): No  Physical Activity: Not on file  Stress: Not on file  Social Connections: Not on file  Intimate Partner Violence: Not on file   Family History  Problem Relation Age of Onset   Diabetes Mother    Bipolar disorder Father    Bipolar disorder Sister    Bipolar disorder Brother    Thyroid disease Brother    Bipolar disorder Brother    Bipolar disorder Brother    Bipolar disorder Maternal Grandmother    Bipolar disorder Paternal Grandmother    Bipolar disorder Paternal Grandfather    Alcohol abuse Paternal Grandfather    Bipolar disorder Paternal Uncle    Alcohol abuse Paternal Uncle     Objective: Office  vital signs reviewed. BP 117/76   Pulse 77   Temp 98.7 F (37.1 C)   Ht 4\' 11"  (1.499 m)   Wt 177 lb (80.3 kg)   SpO2 99%   BMI 35.75 kg/m   Physical Examination:  General: Awake, alert, well nourished, No acute distress HEENT: No conjunctival injection, no rhinorrhea.  MMM Cardio: regular rate and rhythm Pulm: normal WOB on room air. No wheezes Skin: minimal acneiform lesions on face  Assessment/ Plan: 27 y.o. female   Oral contraceptive pill surveillance - Plan: norgestrel-ethinyl estradiol (LOW-OGESTREL) 0.3-30 MG-MCG tablet  Non-seasonal allergic  rhinitis, unspecified trigger - Plan: desloratadine (CLARINEX) 5 MG tablet, fluticasone (FLONASE) 50 MCG/ACT nasal spray, Olopatadine HCl 0.6 % SOLN, montelukast (SINGULAIR) 10 MG tablet  Acne vulgaris - Plan: clindamycin-benzoyl peroxide (BENZACLIN) gel  Dysmenorrhea well controlled.  OCP renewed for continuous use  Allergic rhinitis chronic and stable with current meds. These have been renewed  Acne responds well to benzaclin. This has been reordered.  Follow up in 1 year for Annual PE   No orders of the defined types were placed in this encounter.  No orders of the defined types were placed in this encounter.    Raliegh Ip, DO [facility] [phone]

## 2022-12-11 ENCOUNTER — Telehealth: Payer: Self-pay

## 2022-12-11 ENCOUNTER — Other Ambulatory Visit (HOSPITAL_COMMUNITY): Payer: Self-pay

## 2022-12-11 DIAGNOSIS — L700 Acne vulgaris: Secondary | ICD-10-CM

## 2022-12-11 NOTE — Telephone Encounter (Signed)
Pharmacy Patient Advocate Encounter   Received notification from CoverMyMeds that prior authorization for Clindamycin Phos-Benzoyl Perox 1-5% gel is required/requested.   Insurance verification completed.   The patient is insured through [facility] .   Per test claim:  Generic Duac ( is preferred by the ins.  If suggested medication is appropriate, Please send in a new RX and discontinue this one. If not, please advise as to why it's not appropriate so that we may request a Prior Authorization. No PA submitted at this time.  Please see below for preferred options via Medicaid:

## 2022-12-11 NOTE — Telephone Encounter (Signed)
Pharmacy Patient Advocate Encounter   Received notification from CoverMyMeds that prior authorization for Desloratadine is required/requested.   Insurance verification completed.   The patient is insured through [facility] .   Per test claim: PA required; PA submitted to [facility] via CoverMyMeds Key/confirmation #/EOC [id number] Status is pending

## 2022-12-11 NOTE — Telephone Encounter (Signed)
Pharmacy Patient Advocate Encounter  Received notification from [facility] that Prior Authorization for  Desloratadine 5mg  tabs    has been APPROVED from 12/11/2022 to 12/11/2023. Ran test claim, Copay is $4.00/30days  Approval letter has been attached in patients media

## 2022-12-12 MED ORDER — CLINDAMYCIN PHOS-BENZOYL PEROX 1.2-5 % EX GEL
CUTANEOUS | 99 refills | Status: DC
Start: 2022-12-12 — End: 2023-12-12

## 2023-01-14 ENCOUNTER — Encounter: Payer: Self-pay | Admitting: Family Medicine

## 2023-01-19 ENCOUNTER — Encounter: Payer: Self-pay | Admitting: Family Medicine

## 2023-02-02 ENCOUNTER — Encounter: Payer: Self-pay | Admitting: Family Medicine

## 2023-02-14 ENCOUNTER — Encounter: Payer: Self-pay | Admitting: Family Medicine

## 2023-02-28 ENCOUNTER — Encounter: Payer: Self-pay | Admitting: Family Medicine

## 2023-03-31 ENCOUNTER — Encounter: Payer: Self-pay | Admitting: Internal Medicine

## 2023-04-07 ENCOUNTER — Encounter: Payer: Self-pay | Admitting: Internal Medicine

## 2023-04-07 ENCOUNTER — Telehealth (INDEPENDENT_AMBULATORY_CARE_PROVIDER_SITE_OTHER): Payer: Self-pay | Admitting: Internal Medicine

## 2023-04-07 DIAGNOSIS — E039 Hypothyroidism, unspecified: Secondary | ICD-10-CM

## 2023-04-07 DIAGNOSIS — E66812 Obesity, class 2: Secondary | ICD-10-CM

## 2023-04-07 NOTE — Patient Instructions (Addendum)
Please continue levothyroxine 100 mcg daily.  Take the thyroid hormone every day, with water, at least 30 minutes before breakfast, separated by at least 4 hours from: - acid reflux medications - calcium - iron - multivitamins  Please come back for labs at your convenience.  Please return for another visit in 6 months.

## 2023-04-07 NOTE — Progress Notes (Addendum)
Patient ID: Samantha Tucker, female   DOB: [birth year], 27 y.o.   MRN: [id number]  Patient location: Home My location: Office Persons participating in the virtual visit: patient, provider  Referring Provider: Raliegh Ip, DO  I connected with the patient on 04/07/23 at  8:20 AM EST by a video enabled telemedicine application and verified that I am speaking with the correct person.   I discussed the limitations of evaluation and management by telemedicine and the availability of in person appointments. The patient expressed understanding and agreed to proceed.   Details of the encounter are shown below.   HPI  Samantha Tucker is a 27 y.o.-year-old female, initially referred by her PCP, Dr. Nadine Counts, returning for follow-up for Hashimoto's hypothyroidism.  She previously saw Everardo All and then Dr. Lucianne Muss, but last visit with me 6 months ago.  Her mother, Tieara Arevalos,  is also my patient.   This appointment was scheduled virtually as the patient woke up not feeling well.  Interim history: She has sore throat, feeling more tired, but no fever, cough. She describes that she has had some weight gain recently (before last visit she lost 22 pounds) and feeling more tired.  Reviewed and addended history: Pt. has been dx with hypothyroidism in 2019 - irregular menses, low AP, mood swings.  She was started on Levothyroxine.  She started to feel better, however, she reports eating a significant amount of weight since then, per our chart approximately 60 pounds.  She gained approximately 23 pounds in 2023.   At our first visit in 05/2022, we increased her LT4 dose.  Pt. is on levothyroxine 100 mcg daily, taken: - in am - fasting - previously 30 min from b'fast, now skipping b'fast  - no calcium - no iron - no multivitamins - she started  PPIs (Protonix) - 05/2022, prev. Pepcid for a long time - 30 min -1h after LT4 >> now taking Protonix later in the day >> at night - not on Biotin  I reviewed  pt's thyroid tests: Lab Results  Component Value Date   TSH 3.30 10/04/2022   TSH 3.28 07/18/2022   TSH 18.500 (H) 05/24/2022   TSH 4.43 10/17/2021   TSH 4.900 (H) 03/16/2021   TSH 3.04 10/06/2020   TSH 4.150 04/21/2019   TSH 6.39 (H) 01/22/2019   TSH 5.00 07/20/2018   TSH 4.790 (H) 02/17/2018   FREET4 1.13 10/04/2022   FREET4 1.06 07/18/2022   FREET4 1.01 05/24/2022   FREET4 0.80 10/17/2021   FREET4 1.26 03/16/2021   FREET4 0.87 10/06/2020   FREET4 0.88 01/22/2019   FREET4 0.73 07/20/2018   FREET4 1.33 08/22/2017   FREET4 1.07 07/22/2017   Antithyroid antibodies were positive: Component     Latest Ref Rng 07/18/2022  Thyroglobulin Ab     < or = 1 IU/mL 12 (H)   Thyroperoxidase Ab SerPl-aCnc     <9 IU/mL 325 (H)    Pt denies: - feeling nodules in neck - hoarseness - dysphagia - choking  She has + FH of thyroid disorders in: Brother. No FH of thyroid cancer. No h/o radiation tx to head or neck. No recent use of iodine supplements.  No herbal supplements.  No recent steroid use.  Pt. also has a history of suspected PCOS:  Reviewed previous investigation:  - Normal pelvic ultrasound 10/08/2018 (ordered by OB/GYN-Dr. Marice Potter): Uterus: 8.3 x 3.4 x 4.3 cm = volume: 63.4 mL. No fibroids or other mass visualized. Endometrium Thickness: 4.1 mm.  Normal trilaminar appearance Right ovary: 4.1 x 2.1 x 2.0 cm = volume: 9.1 mL. Corpus luteum noted. Left ovary: 2.5 x 1.2 x 1.6 cm = volume: 2.6 mL. Normal appearance/no adnexal mass. Other findings:  No abnormal free fluid   IMPRESSION: 1. Normal pelvic sonogram.  - Normal ACTH and cortisol levels in 03/2021: Component     Latest Ref Rng 03/16/2021  Cortisol, Plasma     mcg/dL 16.1   W960 ACTH     6 - 50 pg/mL 15    - Normal testosterone and prolactin levels in 10/2021 - Dr. Lucianne Muss advised her that she likely did not have PCOS and was not a candidate for metformin.  He recommended a referral to the Weight management  clinic. Component     Latest Ref Rng 10/17/2021  Testosterone     13 - 71 ng/dL 26   Testosterone Free     0.0 - 4.2 pg/mL 1.5   Sex Horm Binding Glob, Serum     24.6 - 122.0 nmol/L 26.4   Prolactin     4.8 - 23.3 ng/mL 17.7    She is on spironolactone.  She has a h/o POTS - dx'ed 2014 - now controlled.  ROS: + see HPI  Past Medical History:  Diagnosis Date   ADHD (attention deficit hyperactivity disorder)    Allergy    Anxiety    Constipation    Environmental allergies    Nausea    Postural hypotension    Past Surgical History:  Procedure Laterality Date   TYMPANOSTOMY TUBE PLACEMENT Bilateral [id number]   Social History   Socioeconomic History   Marital status: Single    Spouse name: Not on file   Number of children: Not on file   Years of education: Not on file   Highest education level: Not on file  Occupational History   Not on file  Tobacco Use   Smoking status: Never   Smokeless tobacco: Never  Substance and Sexual Activity   Alcohol use: No   Drug use: No   Sexual activity: Never  Other Topics Concern   Not on file  Social History Narrative   Not on file   Social Determinants of Health   Financial Resource Strain: Not on file  Food Insecurity: No Food Insecurity (12/20/2019)   Hunger Vital Sign    Worried About Running Out of Food in the Last Year: Never true    Ran Out of Food in the Last Year: Never true  Transportation Needs: No Transportation Needs (12/20/2019)   PRAPARE - Administrator, Civil Service (Medical): No    Lack of Transportation (Non-Medical): No  Physical Activity: Not on file  Stress: Not on file  Social Connections: Not on file  Intimate Partner Violence: Not on file   Current Outpatient Medications on File Prior to Visit  Medication Sig Dispense Refill   albuterol (PROVENTIL HFA;VENTOLIN HFA) 108 (90 Base) MCG/ACT inhaler Inhale 2 puffs into the lungs every 6 (six) hours as needed for wheezing or shortness of  breath. 1 Inhaler 0   amoxicillin (AMOXIL) 500 MG capsule Take 500 mg by mouth 3 (three) times daily.     amphetamine-dextroamphetamine (ADDERALL XR) 20 MG 24 hr capsule Take 2 capsules (40 mg total) by mouth every morning. 60 capsule 0   amphetamine-dextroamphetamine (ADDERALL XR) 20 MG 24 hr capsule Take 2 capsules (40 mg total) by mouth every morning. 60 capsule 0   amphetamine-dextroamphetamine (ADDERALL XR) 20 MG  24 hr capsule Take 2 capsules (40 mg total) by mouth every morning. 60 capsule 0   amphetamine-dextroamphetamine (ADDERALL) 10 MG tablet Take 1 tablet (10 mg total) by mouth daily. 30 tablet 0   amphetamine-dextroamphetamine (ADDERALL) 10 MG tablet Take 1 tablet (10 mg total) by mouth daily. 30 tablet 0   amphetamine-dextroamphetamine (ADDERALL) 10 MG tablet Take 1 tablet (10 mg total) by mouth daily. 30 tablet 0   buPROPion (WELLBUTRIN XL) 150 MG 24 hr tablet TAKE ONE TABLET BY MOUTH DAILY WITH 300MG  TO EQUAL 450MG  DAILY DOSE 90 tablet 2   buPROPion (WELLBUTRIN XL) 300 MG 24 hr tablet Take 1 tablet (300 mg total) by mouth every morning. 90 tablet 2   cariprazine (VRAYLAR) 1.5 MG capsule Take 1 capsule (1.5 mg total) by mouth daily. 30 capsule 2   Clindamycin-Benzoyl Per, Refr, gel Apply to affected acne areas twice daily. (To replace benzaclin due to ins) 45 g PRN   clonazePAM (KLONOPIN) 0.5 MG tablet Take 1 tablet (0.5 mg total) by mouth 2 (two) times daily as needed for anxiety. 60 tablet 2   desloratadine (CLARINEX) 5 MG tablet Take 1 tablet (5 mg total) by mouth daily. 90 tablet 3   divalproex (DEPAKOTE ER) 500 MG 24 hr tablet TAKE TWO TABLETS BY MOUTH EVERY NIGHT AT BEDTIME 60 tablet 2   fluticasone (FLONASE) 50 MCG/ACT nasal spray Place 2 sprays into both nostrils daily. 16 g 4   gabapentin (NEURONTIN) 300 MG capsule Take 1 capsule (300 mg total) by mouth 3 (three) times daily. 270 capsule 2   levothyroxine (SYNTHROID) 100 MCG tablet Take 1 tablet (100 mcg total) by mouth daily.  90 tablet 3   montelukast (SINGULAIR) 10 MG tablet Take 1 tablet (10 mg total) by mouth at bedtime. 90 tablet 3   naproxen (NAPROSYN) 500 MG tablet TAKE 1 TAB BY MOUTH 2 (TWO) TIMES DAILY WITH A MEAL. AS NEEDED FOR MENSTRUAL CRAMPING. 30 tablet 0   norgestrel-ethinyl estradiol (LOW-OGESTREL) 0.3-30 MG-MCG tablet TAKE ONE TABLET BY MOUTH DAILY CONTINUOUSLY SKIP SUGAR PILL WEEK 84 tablet PRN   Olopatadine HCl 0.6 % SOLN Place 2 sprays into the nose in the morning and at bedtime. 30.5 g 3   ondansetron (ZOFRAN-ODT) 4 MG disintegrating tablet Take 1 tablet (4 mg total) by mouth every 8 (eight) hours as needed for nausea or vomiting. 20 tablet 1   pantoprazole (PROTONIX) 40 MG tablet Take 1 tablet (40 mg total) by mouth daily. 90 tablet 3   spironolactone (ALDACTONE) 25 MG tablet TK 1 T PO BID  2   traZODone (DESYREL) 100 MG tablet Take 1 tablet (100 mg total) by mouth at bedtime. 90 tablet 2   No current facility-administered medications on file prior to visit.   Allergies  Allergen Reactions   Other Hives and Shortness Of Breath    Develops hives with exposure to cold air.    Claritin [Loratadine]     Fainting spells   Sulfa Antibiotics    Latex Hives   Family History  Problem Relation Age of Onset   Diabetes Mother    Bipolar disorder Father    Bipolar disorder Sister    Bipolar disorder Brother    Thyroid disease Brother    Bipolar disorder Brother    Bipolar disorder Brother    Bipolar disorder Maternal Grandmother    Bipolar disorder Paternal Grandmother    Bipolar disorder Paternal Grandfather    Alcohol abuse Paternal Grandfather    Bipolar  disorder Paternal Uncle    Alcohol abuse Paternal Uncle    PE: There were no vitals taken for this visit. Wt 182 lbs. Wt Readings from Last 10 Encounters:  12/10/22 177 lb (80.3 kg)  10/04/22 180 lb 3.2 oz (81.7 kg)  08/23/22 186 lb (84.4 kg)  06/07/22 202 lb (91.6 kg)  05/24/22 201 lb 9.6 oz (91.4 kg)  10/11/21 197 lb 3.2 oz (89.4  kg)  07/05/21 179 lb 3.2 oz (81.3 kg)  03/16/21 160 lb 4.8 oz (72.7 kg)  09/08/20 130 lb 9.6 oz (59.2 kg)  12/20/19 128 lb 9.6 oz (58.3 kg)   Constitutional:  in NAD  The physical exam was not performed (virtual visit).  ASSESSMENT: 1.  Hashimoto's hypothyroidism  2.  Obesity class 2  PLAN:  1. Patient with longstanding hypothyroidism, on levothyroxine therapy -Her TPO and ATA antibodies were positive giving her a diagnosis of Hashimoto's thyroiditis - latest thyroid labs reviewed with pt. >> normal: Lab Results  Component Value Date   TSH 3.30 10/04/2022  - she continues on LT4 100 mcg daily - pt feels good on this dose.  Before last visit, she lost 16 pounds. - we discussed about taking the thyroid hormone every day, with water, >30 minutes before breakfast, separated by >4 hours from acid reflux medications, calcium, iron, multivitamins. Pt. is taking it correctly.  She was previously taking acid reflux medication close levothyroxine we discussed about separating them by at least 4 hours. - will check thyroid tests when she can come to the lab: TSH and fT4 - If labs are abnormal, she will need to return for repeat TFTs in 1.5 months -Will see her back in a yearI  2.  Obesity class 2 -She gained ~60 pounds after her diagnosis of hypothyroidism; however, before last visit, she lost 22 pounds.  At today's visit, she is net +2 pounds since last visit, but recently gained approximately 12 pounds reportedly -Reviewing previous investigation, there was no clear indication of PCOS (transvaginal ultrasound and testosterone levels were normal), cortisol and ACTH levels were normal, prolactin level was also normal. She has a history of depression, which can contribute -She is worried about weight gain and would like to have her thyroid test checked to make sure that this is not the cause for her increased fatigue and weight gain.  She will come for labs at her convenience-I advised her to  wait for her URI to resolve before coming.  Orders Placed This Encounter  Procedures   T4, free   TSH   Component     Latest Ref Rng 04/18/2023  TSH     mIU/L 6.39 (H)   T4,Free(Direct)     0.8 - 1.8 ng/dL 1.4   TSH is higher than before, will increase the dose of levothyroxine to 112 mcg daily and repeat labs in 1.5 mo.  Carlus Pavlov, MD PhD [facility]

## 2023-04-18 ENCOUNTER — Other Ambulatory Visit: Payer: Self-pay

## 2023-04-19 LAB — TSH: TSH: 6.39 m[IU]/L — ABNORMAL HIGH

## 2023-04-19 LAB — T4, FREE: Free T4: 1.4 ng/dL (ref 0.8–1.8)

## 2023-04-21 MED ORDER — LEVOTHYROXINE SODIUM 112 MCG PO TABS: 112.0000 ug | ORAL_TABLET | Freq: Every day | ORAL | 3 refills | Status: DC

## 2023-04-21 NOTE — Addendum Note (Signed)
Addended by: Carlus Pavlov on: 04/21/2023 12:06 PM   Modules accepted: Orders

## 2023-04-25 ENCOUNTER — Encounter: Payer: Self-pay | Admitting: Family Medicine

## 2023-05-19 ENCOUNTER — Other Ambulatory Visit (HOSPITAL_COMMUNITY): Payer: Self-pay | Admitting: Psychiatry

## 2023-05-19 ENCOUNTER — Telehealth (HOSPITAL_COMMUNITY): Payer: Self-pay

## 2023-05-19 MED ORDER — AMPHETAMINE-DEXTROAMPHET ER 20 MG PO CP24: 40.0000 mg | ORAL_CAPSULE | ORAL | 0 refills | Status: DC

## 2023-05-19 NOTE — Telephone Encounter (Signed)
 Spoke with pt advised rx has been sent to the pharmacy she verbalized understanding

## 2023-05-19 NOTE — Telephone Encounter (Signed)
 sent

## 2023-05-19 NOTE — Telephone Encounter (Signed)
 Pt called in requesting a refill on her amphetamine-dextroamphetamine (ADDERALL XR) 20 MG 24 hr capsule sent to [facility] on [address]. Pt scheduled for 05/23/23. Please advise.

## 2023-05-23 ENCOUNTER — Telehealth (INDEPENDENT_AMBULATORY_CARE_PROVIDER_SITE_OTHER): Payer: Self-pay | Admitting: Psychiatry

## 2023-05-23 ENCOUNTER — Encounter (HOSPITAL_COMMUNITY): Payer: Self-pay | Admitting: Psychiatry

## 2023-05-23 DIAGNOSIS — F331 Major depressive disorder, recurrent, moderate: Secondary | ICD-10-CM

## 2023-05-23 DIAGNOSIS — F988 Other specified behavioral and emotional disorders with onset usually occurring in childhood and adolescence: Secondary | ICD-10-CM

## 2023-05-23 DIAGNOSIS — F3162 Bipolar disorder, current episode mixed, moderate: Secondary | ICD-10-CM

## 2023-05-23 MED ORDER — DIVALPROEX SODIUM ER 500 MG PO TB24: ORAL_TABLET | ORAL | 2 refills | Status: DC

## 2023-05-23 MED ORDER — AMPHETAMINE-DEXTROAMPHET ER 20 MG PO CP24: 40.0000 mg | ORAL_CAPSULE | Freq: Every day | ORAL | 0 refills | Status: DC

## 2023-05-23 MED ORDER — BUPROPION HCL ER (XL) 150 MG PO TB24: ORAL_TABLET | ORAL | 2 refills | Status: DC

## 2023-05-23 MED ORDER — AMPHETAMINE-DEXTROAMPHETAMINE 10 MG PO TABS: 10.0000 mg | ORAL_TABLET | Freq: Every day | ORAL | 0 refills | Status: DC

## 2023-05-23 MED ORDER — AMPHETAMINE-DEXTROAMPHET ER 20 MG PO CP24: 40.0000 mg | ORAL_CAPSULE | ORAL | 0 refills | Status: DC

## 2023-05-23 MED ORDER — CARIPRAZINE HCL 1.5 MG PO CAPS: 1.5000 mg | ORAL_CAPSULE | Freq: Every day | ORAL | 2 refills | Status: DC

## 2023-05-23 MED ORDER — GABAPENTIN 300 MG PO CAPS: 300.0000 mg | ORAL_CAPSULE | Freq: Three times a day (TID) | ORAL | 2 refills | Status: DC

## 2023-05-23 MED ORDER — CLONAZEPAM 0.5 MG PO TABS: 0.5000 mg | ORAL_TABLET | Freq: Two times a day (BID) | ORAL | 2 refills | Status: DC | PRN

## 2023-05-23 MED ORDER — TRAZODONE HCL 100 MG PO TABS: 100.0000 mg | ORAL_TABLET | Freq: Every day | ORAL | 2 refills | Status: DC

## 2023-05-23 NOTE — Progress Notes (Signed)
 Virtual Visit via Video Note  I connected with Samantha Tucker on 05/23/23 at 10:40 AM EST by a video enabled telemedicine application and verified that I am speaking with the correct person using two identifiers.  Location: Patient: home Provider: office   I discussed the limitations of evaluation and management by telemedicine and the availability of in person appointments. The patient expressed understanding and agreed to proceed.      I discussed the assessment and treatment plan with the patient. The patient was provided an opportunity to ask questions and all were answered. The patient agreed with the plan and demonstrated an understanding of the instructions.   The patient was advised to call back or seek an in-person evaluation if the symptoms worsen or if the condition fails to improve as anticipated.  I provided 20 minutes of non-face-to-face time during this encounter.   Barnie Gull, MD  [facility] MD/PA/NP OP Progress Note  05/23/2023 11:04 AM Samantha Tucker  MRN:  [id number]  Chief Complaint:  Chief Complaint  Patient presents with   ADD   Anxiety   Depression   Follow-up   HPI:  This patient is a 28 year-old single white female who lives with her family in [place].  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  She states overall she is doing well.  She is in her last semester at [employer].  She is going to transfer to a online for your college.  She wants is that he criminal forensics.  She states that her focus is good.  She states her mood is also good and she denies significant depression anxiety thoughts of self-harm or mood swings.  She is sleeping well.  Her Synthroid  was recently adjusted because it was too low and she is now feeling better and has more energy. Visit Diagnosis:    ICD-10-CM   1. Major depressive disorder,  recurrent episode, moderate (HCC)  F33.1     2. ADD (attention deficit disorder) without hyperactivity  F98.8     3. Moderate mixed bipolar I disorder (HCC)  F31.62       Past Psychiatric History: Long-term outpatient treatment  Past Medical History:  Past Medical History:  Diagnosis Date   ADHD (attention deficit hyperactivity disorder)    Allergy    Anxiety    Constipation    Environmental allergies    Nausea    Postural hypotension     Past Surgical History:  Procedure Laterality Date   TYMPANOSTOMY TUBE PLACEMENT Bilateral [id number]    Family Psychiatric History: See below  Family History:  Family History  Problem Relation Age of Onset   Diabetes Mother    Bipolar disorder Father    Bipolar disorder Sister    Bipolar disorder Brother    Thyroid  disease Brother    Bipolar disorder Brother    Bipolar disorder Brother    Bipolar disorder Maternal Grandmother    Bipolar disorder Paternal Grandmother    Bipolar disorder Paternal Grandfather    Alcohol abuse Paternal Grandfather    Bipolar disorder Paternal Uncle    Alcohol abuse Paternal Uncle     Social History:  Social History   Socioeconomic History   Marital status: Single    Spouse name: Not on file   Number of children: Not on file   Years of education:  Not on file   Highest education level: Not on file  Occupational History   Not on file  Tobacco Use   Smoking status: Never   Smokeless tobacco: Never  Substance and Sexual Activity   Alcohol use: No   Drug use: No   Sexual activity: Never  Other Topics Concern   Not on file  Social History Narrative   Not on file   Social Drivers of Health   Financial Resource Strain: Not on file  Food Insecurity: No Food Insecurity (12/20/2019)   Hunger Vital Sign    Worried About Running Out of Food in the Last Year: Never true    Ran Out of Food in the Last Year: Never true  Transportation Needs: No Transportation Needs (12/20/2019)   PRAPARE -  Administrator, Civil Service (Medical): No    Lack of Transportation (Non-Medical): No  Physical Activity: Not on file  Stress: Not on file  Social Connections: Not on file    Allergies:  Allergies  Allergen Reactions   Other Hives and Shortness Of Breath    Develops hives with exposure to cold air.    Claritin [Loratadine]     Fainting spells   Sulfa Antibiotics    Latex Hives    Metabolic Disorder Labs: Lab Results  Component Value Date   HGBA1C 5.7 (H) 05/24/2022   Lab Results  Component Value Date   PROLACTIN 17.7 10/17/2021   No results found for: CHOL, TRIG, HDL, CHOLHDL, VLDL, LDLCALC Lab Results  Component Value Date   TSH 6.39 (H) 04/18/2023   TSH 3.30 10/04/2022    Therapeutic Level Labs: No results found for: LITHIUM Lab Results  Component Value Date   VALPROATE 60.0 08/18/2020   VALPROATE 74.5 01/26/2019   No results found for: CBMZ  Current Medications: Current Outpatient Medications  Medication Sig Dispense Refill   amphetamine -dextroamphetamine  (ADDERALL XR) 20 MG 24 hr capsule Take 2 capsules (40 mg total) by mouth every morning. 60 capsule 0   amphetamine -dextroamphetamine  (ADDERALL XR) 20 MG 24 hr capsule Take 2 capsules (40 mg total) by mouth daily. 60 capsule 0   amphetamine -dextroamphetamine  (ADDERALL) 10 MG tablet Take 1 tablet (10 mg total) by mouth daily. 30 tablet 0   albuterol  (PROVENTIL  HFA;VENTOLIN  HFA) 108 (90 Base) MCG/ACT inhaler Inhale 2 puffs into the lungs every 6 (six) hours as needed for wheezing or shortness of breath. 1 Inhaler 0   amoxicillin  (AMOXIL ) 500 MG capsule Take 500 mg by mouth 3 (three) times daily.     amphetamine -dextroamphetamine  (ADDERALL XR) 20 MG 24 hr capsule Take 2 capsules (40 mg total) by mouth every morning. 60 capsule 0   amphetamine -dextroamphetamine  (ADDERALL) 10 MG tablet Take 1 tablet (10 mg total) by mouth daily. 30 tablet 0   amphetamine -dextroamphetamine  (ADDERALL) 10  MG tablet Take 1 tablet (10 mg total) by mouth daily. 30 tablet 0   buPROPion  (WELLBUTRIN  XL) 150 MG 24 hr tablet TAKE ONE TABLET BY MOUTH DAILY WITH 300MG  TO EQUAL 450MG  DAILY DOSE 90 tablet 2   buPROPion  (WELLBUTRIN  XL) 300 MG 24 hr tablet Take 1 tablet (300 mg total) by mouth every morning. 90 tablet 2   cariprazine  (VRAYLAR ) 1.5 MG capsule Take 1 capsule (1.5 mg total) by mouth daily. 30 capsule 2   Clindamycin -Benzoyl Per, Refr, gel Apply to affected acne areas twice daily. (To replace benzaclin due to ins) 45 g PRN   clonazePAM  (KLONOPIN ) 0.5 MG tablet Take 1 tablet (0.5 mg  total) by mouth 2 (two) times daily as needed for anxiety. 60 tablet 2   desloratadine  (CLARINEX ) 5 MG tablet Take 1 tablet (5 mg total) by mouth daily. 90 tablet 3   divalproex  (DEPAKOTE  ER) 500 MG 24 hr tablet TAKE TWO TABLETS BY MOUTH EVERY NIGHT AT BEDTIME 60 tablet 2   fluticasone  (FLONASE ) 50 MCG/ACT nasal spray Place 2 sprays into both nostrils daily. 16 g 4   gabapentin  (NEURONTIN ) 300 MG capsule Take 1 capsule (300 mg total) by mouth 3 (three) times daily. 270 capsule 2   levothyroxine  (SYNTHROID ) 112 MCG tablet Take 1 tablet (112 mcg total) by mouth daily. 45 tablet 3   montelukast  (SINGULAIR ) 10 MG tablet Take 1 tablet (10 mg total) by mouth at bedtime. 90 tablet 3   naproxen  (NAPROSYN ) 500 MG tablet TAKE 1 TAB BY MOUTH 2 (TWO) TIMES DAILY WITH A MEAL. AS NEEDED FOR MENSTRUAL CRAMPING. 30 tablet 0   norgestrel -ethinyl estradiol  (LOW-OGESTREL ) 0.3-30 MG-MCG tablet TAKE ONE TABLET BY MOUTH DAILY CONTINUOUSLY SKIP SUGAR PILL WEEK 84 tablet PRN   Olopatadine  HCl 0.6 % SOLN Place 2 sprays into the nose in the morning and at bedtime. 30.5 g 3   ondansetron  (ZOFRAN -ODT) 4 MG disintegrating tablet Take 1 tablet (4 mg total) by mouth every 8 (eight) hours as needed for nausea or vomiting. 20 tablet 1   pantoprazole  (PROTONIX ) 40 MG tablet Take 1 tablet (40 mg total) by mouth daily. 90 tablet 3   spironolactone (ALDACTONE)  25 MG tablet TK 1 T PO BID  2   traZODone  (DESYREL ) 100 MG tablet Take 1 tablet (100 mg total) by mouth at bedtime. 90 tablet 2   No current facility-administered medications for this visit.     Musculoskeletal: Strength & Muscle Tone: within normal limits Gait & Station: normal Patient leans: N/A  Psychiatric Specialty Exam: Review of Systems  All other systems reviewed and are negative.   There were no vitals taken for this visit.There is no height or weight on file to calculate BMI.  General Appearance: Casual, Neat, and Well Groomed  Eye Contact:  Good  Speech:  Clear and Coherent  Volume:  Normal  Mood:  Euthymic  Affect:  Congruent  Thought Process:  Goal Directed  Orientation:  Full (Time, Place, and Person)  Thought Content: WDL   Suicidal Thoughts:  No  Homicidal Thoughts:  No  Memory:  Immediate;   Good Recent;   Good Remote;   NA  Judgement:  Good  Insight:  Fair  Psychomotor Activity:  Normal  Concentration:  Concentration: Good and Attention Span: Good  Recall:  Good  Fund of Knowledge: Good  Language: Good  Akathisia:  No  Handed:  Right  AIMS (if indicated): not done  Assets:  Communication Skills Desire for Improvement Physical Health Resilience Social Support Talents/Skills  ADL's:  Intact  Cognition: WNL  Sleep:  Good   Screenings: GAD-7    Flowsheet Row Office Visit from 12/10/2022 in [facility] Office Visit from 08/23/2022 in [facility] Visit from 05/24/2022 in [facility] Visit from 07/05/2021 in [facility] Visit from 12/20/2019 in [facility]  Total GAD-7 Score 0 0 0 1 3      PHQ2-9    Flowsheet Row Office Visit from 12/10/2022 in [facility] Office Visit from 08/23/2022 in  [facility] [place] [facility] Office Visit from 05/24/2022 in [facility] Video Visit from 01/10/2022 in [facility] [place] Video Visit from 08/03/2021 in [facility] at [facility] Total Score 0 0 0 0 0  PHQ-9 Total Score 0 0 -- -- --      Flowsheet Row Video Visit from 01/10/2022 in [facility] at [place] Video Visit from 08/03/2021 in [facility] at [place] Video Visit from 09/28/2020 in [facility] at [place]  C-SSRS RISK CATEGORY No Risk No Risk Error: Question 6 not populated        Assessment and Plan: This patient is a 28 year old female with a history of depression anger irritability possible mixed bipolar disorder ADHD and insomnia.  She continues to do well on her current regimen.  She will continue Vraylar  1.5 mg daily for bipolar depression, Depakote  1000 mg at bedtime also for bipolar disorder, gabapentin  300 mg 3 times daily for anxiety, clonazepam  0.5 mg twice daily only as needed for anxiety, trazodone  100 mg at bedtime for sleep, Wellbutrin  XL 450 mg daily for depression, Adderall XR 40 mg every morning for ADD and Adderall 10 mg later in the day also for ADD.  She will return to see me in 3 months  Collaboration of Care: Collaboration of Care: Primary Care Provider AEB notes are shared with PCP on the epic system  Patient/Guardian was advised Release of Information must be obtained prior to any record release in order to collaborate their care with an outside provider. Patient/Guardian was advised if they have not already done so to contact the registration department to sign all necessary forms in order for us  to release information regarding their care.   Consent: Patient/Guardian gives verbal consent for treatment and assignment of benefits for services provided  during this visit. Patient/Guardian expressed understanding and agreed to proceed.    Barnie Gull, MD 05/23/2023, 11:04 AM

## 2023-05-30 ENCOUNTER — Encounter (HOSPITAL_COMMUNITY): Payer: Self-pay

## 2023-05-30 NOTE — Telephone Encounter (Signed)
You can try taking one extra gabapentin per day. If this helps let me know andI'll send in a higher quantity

## 2023-09-04 ENCOUNTER — Encounter (INDEPENDENT_AMBULATORY_CARE_PROVIDER_SITE_OTHER): Payer: Self-pay | Admitting: Family Medicine

## 2023-09-04 DIAGNOSIS — K219 Gastro-esophageal reflux disease without esophagitis: Secondary | ICD-10-CM | POA: Diagnosis not present

## 2023-09-05 MED ORDER — PANTOPRAZOLE SODIUM 40 MG PO TBEC: 40.0000 mg | DELAYED_RELEASE_TABLET | Freq: Two times a day (BID) | ORAL | 3 refills | Status: DC

## 2023-09-05 NOTE — Telephone Encounter (Signed)

## 2023-09-06 ENCOUNTER — Other Ambulatory Visit: Payer: Self-pay | Admitting: Family Medicine

## 2023-09-06 DIAGNOSIS — K219 Gastro-esophageal reflux disease without esophagitis: Secondary | ICD-10-CM

## 2023-09-08 ENCOUNTER — Encounter: Payer: Self-pay | Admitting: Internal Medicine

## 2023-09-09 ENCOUNTER — Encounter: Payer: Self-pay | Admitting: Family Medicine

## 2023-09-17 ENCOUNTER — Telehealth: Payer: Self-pay

## 2023-09-17 NOTE — Telephone Encounter (Signed)
 Spoke to lab and they had no information to provide. I will wait till tomorrow and speak to Danielle to see if she is able to help with this patient.

## 2023-09-17 NOTE — Telephone Encounter (Signed)
 Copied from CRM [id number]. Topic: Clinical - Request for Lab/Test Order >> Sep 17, 2023  2:58 PM Ivette P wrote: Reason for CRM: Pt mom Prentice Brochure called in about lab work orders from [employer], Pt wanted to see the cost of the lab work at [facility] in primary office. Called [facility] and spoke to [facility] and was told there is no way of them knowing [facility] charges and do not have a phone number would need to [employer].   Deland Favre that there was no number for [facility] in clinic and Prentice Brochure was not happy and would like for someone to follow up about this matter.     Prentice Brochure callback [phone]

## 2023-09-18 NOTE — Telephone Encounter (Signed)
 Pt's mom informed tsh 43$ per [facility].

## 2023-09-26 ENCOUNTER — Encounter: Payer: Self-pay | Admitting: Family Medicine

## 2023-09-29 ENCOUNTER — Encounter: Payer: Self-pay | Admitting: Family Medicine

## 2023-09-29 NOTE — Telephone Encounter (Signed)
 Please provide her a note that states she needs to have water on her at all times

## 2023-10-22 ENCOUNTER — Other Ambulatory Visit: Payer: Self-pay | Admitting: Internal Medicine

## 2023-11-16 ENCOUNTER — Other Ambulatory Visit: Payer: Self-pay | Admitting: Internal Medicine

## 2023-11-17 NOTE — Telephone Encounter (Signed)
 Refill request complete

## 2023-11-25 ENCOUNTER — Encounter (HOSPITAL_COMMUNITY): Payer: Self-pay

## 2023-11-26 ENCOUNTER — Other Ambulatory Visit (HOSPITAL_COMMUNITY): Payer: Self-pay | Admitting: Psychiatry

## 2023-11-26 MED ORDER — AMPHETAMINE-DEXTROAMPHETAMINE 10 MG PO TABS: 10.0000 mg | ORAL_TABLET | Freq: Every day | ORAL | 0 refills | Status: DC

## 2023-11-26 MED ORDER — AMPHETAMINE-DEXTROAMPHET ER 20 MG PO CP24: 40.0000 mg | ORAL_CAPSULE | ORAL | 0 refills | Status: DC

## 2023-12-06 ENCOUNTER — Other Ambulatory Visit: Payer: Self-pay | Admitting: Family Medicine

## 2023-12-06 DIAGNOSIS — J3089 Other allergic rhinitis: Secondary | ICD-10-CM

## 2023-12-12 ENCOUNTER — Encounter: Payer: Self-pay | Admitting: Family Medicine

## 2023-12-12 ENCOUNTER — Ambulatory Visit (INDEPENDENT_AMBULATORY_CARE_PROVIDER_SITE_OTHER): Payer: Medicaid Other | Admitting: Family Medicine

## 2023-12-12 VITALS — BP 112/76 | HR 110 | Temp 97.7°F | Ht 59.0 in | Wt 190.1 lb

## 2023-12-12 DIAGNOSIS — E039 Hypothyroidism, unspecified: Secondary | ICD-10-CM

## 2023-12-12 DIAGNOSIS — M25561 Pain in right knee: Secondary | ICD-10-CM

## 2023-12-12 DIAGNOSIS — Z8249 Family history of ischemic heart disease and other diseases of the circulatory system: Secondary | ICD-10-CM

## 2023-12-12 DIAGNOSIS — L700 Acne vulgaris: Secondary | ICD-10-CM

## 2023-12-12 DIAGNOSIS — Z793 Long term (current) use of hormonal contraceptives: Secondary | ICD-10-CM

## 2023-12-12 DIAGNOSIS — Z0001 Encounter for general adult medical examination with abnormal findings: Secondary | ICD-10-CM

## 2023-12-12 DIAGNOSIS — J3089 Other allergic rhinitis: Secondary | ICD-10-CM

## 2023-12-12 DIAGNOSIS — Z0000 Encounter for general adult medical examination without abnormal findings: Secondary | ICD-10-CM

## 2023-12-12 DIAGNOSIS — N946 Dysmenorrhea, unspecified: Secondary | ICD-10-CM

## 2023-12-12 DIAGNOSIS — G90A Postural orthostatic tachycardia syndrome (POTS): Secondary | ICD-10-CM

## 2023-12-12 DIAGNOSIS — K219 Gastro-esophageal reflux disease without esophagitis: Secondary | ICD-10-CM

## 2023-12-12 MED ORDER — ALBUTEROL SULFATE HFA 108 (90 BASE) MCG/ACT IN AERS: 2.0000 | INHALATION_SPRAY | Freq: Four times a day (QID) | RESPIRATORY_TRACT | 0 refills | Status: AC | PRN

## 2023-12-12 MED ORDER — PANTOPRAZOLE SODIUM 40 MG PO TBEC: 40.0000 mg | DELAYED_RELEASE_TABLET | Freq: Two times a day (BID) | ORAL | 3 refills | Status: AC

## 2023-12-12 MED ORDER — FLUTICASONE PROPIONATE 50 MCG/ACT NA SUSP: 2.0000 | Freq: Every day | NASAL | 4 refills | Status: AC

## 2023-12-12 MED ORDER — LOW-OGESTREL 0.3-30 MG-MCG PO TABS
ORAL_TABLET | ORAL | 99 refills | Status: AC
Start: 2023-12-12 — End: ?

## 2023-12-12 MED ORDER — CLINDAMYCIN PHOS-BENZOYL PEROX 1-5 % EX GEL: Freq: Two times a day (BID) | CUTANEOUS | 3 refills | Status: AC

## 2023-12-12 MED ORDER — MONTELUKAST SODIUM 10 MG PO TABS
10.0000 mg | ORAL_TABLET | Freq: Every day | ORAL | 1 refills | Status: AC
Start: 2023-12-12 — End: ?

## 2023-12-12 MED ORDER — DESLORATADINE 5 MG PO TABS: 5.0000 mg | ORAL_TABLET | Freq: Every day | ORAL | 3 refills | Status: AC

## 2023-12-12 NOTE — Progress Notes (Signed)
 Samantha Tucker is a 28 y.o. female presents to office today for annual physical exam examination.    Concerns today include: 1. None.  She reports that she excellently hurt her right knee when she was dancing recently but otherwise is doing well.  She just finished getting 2 associates degrees and is going to start at [employer] virtually for a degree in cyber surveillance.  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  Resides with mother, Marital status: Single, Substance use: None Health Maintenance Due  Topic Date Due   HPV VACCINES (2 - 2-dose series) 06/08/2009   Hepatitis C Screening  Never done   DTaP/Tdap/Td (7 - Td or Tdap) 12/07/2018   Cervical Cancer Screening (Pap smear)  04/20/2022   Refills needed today: All  Immunization History  Administered Date(s) Administered    Astrazeneca Covid-19 Vaccine, Pf, 0.5 Ml Non Us   12/27/2019   DTaP 12/21/1997, 07/11/1998, 08/10/1998, 06/01/1999   HIB (PRP-OMP) 12/21/1997, 07/11/1998, 06/01/1999   Hepatitis A, Ped/Adol-2 Dose 01/01/2016   Hepatitis B Dec 03, 1997, 11/10/1997, 07/11/1998   Hpv-Unspecified 12/06/2008   IPV 12/21/1997, 07/11/1998, 08/10/1998   Influenza Split 03/13/2006   Influenza,inj,Quad PF,6+ Mos 02/24/2014, 04/21/2019   Janssen (J&J) SARS-COV-2 Vaccination 07/24/2019   MMR 06/01/1999   Meningococcal polysaccharide vaccine (MPSV4) 01/01/2016   Pneumococcal Conjugate-13 06/01/1999, 10/15/1999   Td 12/06/2008   Tdap 12/06/2008   Varicella 06/01/1999, 01/01/2016   Past Medical History:  Diagnosis Date   ADHD (attention deficit hyperactivity disorder)    Allergy    Anxiety    Constipation    Environmental allergies    Nausea    Postural hypotension    Social History   Socioeconomic History   Marital status: Single    Spouse name: Not on file   Number of children: Not on file   Years of education: Not on file   Highest education level: Not on file  Occupational History    Not on file  Tobacco Use   Smoking status: Never   Smokeless tobacco: Never  Substance and Sexual Activity   Alcohol use: No   Drug use: No   Sexual activity: Never  Other Topics Concern   Not on file  Social History Narrative   Not on file   Social Drivers of Health   Financial Resource Strain: Not on file  Food Insecurity: No Food Insecurity (12/20/2019)   Hunger Vital Sign    Worried About Running Out of Food in the Last Year: Never true    Ran Out of Food in the Last Year: Never true  Transportation Needs: No Transportation Needs (12/20/2019)   PRAPARE - Administrator, Civil Service (Medical): No    Lack of Transportation (Non-Medical): No  Physical Activity: Not on file  Stress: Not on file  Social Connections: Not on file  Intimate Partner Violence: Not on file   Past Surgical History:  Procedure Laterality Date   TYMPANOSTOMY TUBE PLACEMENT Bilateral [id number]   Family History  Problem Relation Age of Onset   Diabetes Mother    Bipolar disorder Father    Bipolar disorder Sister    Bipolar disorder Brother    Thyroid  disease Brother    Bipolar disorder Brother    Bipolar disorder Brother    Heart attack Maternal Aunt        41s   Bipolar disorder Paternal Uncle    Alcohol abuse Paternal Uncle  Bipolar disorder Maternal Grandmother    Bipolar disorder Paternal Grandmother    Bipolar disorder Paternal Grandfather    Alcohol abuse Paternal Grandfather     Current Outpatient Medications:    amoxicillin  (AMOXIL ) 500 MG capsule, Take 500 mg by mouth 3 (three) times daily., Disp: , Rfl:    amphetamine -dextroamphetamine  (ADDERALL XR) 20 MG 24 hr capsule, Take 2 capsules (40 mg total) by mouth every morning., Disp: 60 capsule, Rfl: 0   amphetamine -dextroamphetamine  (ADDERALL XR) 20 MG 24 hr capsule, Take 2 capsules (40 mg total) by mouth daily., Disp: 60 capsule, Rfl: 0   amphetamine -dextroamphetamine  (ADDERALL XR) 20 MG 24 hr capsule, Take 2 capsules  (40 mg total) by mouth every morning., Disp: 60 capsule, Rfl: 0   amphetamine -dextroamphetamine  (ADDERALL) 10 MG tablet, Take 1 tablet (10 mg total) by mouth daily., Disp: 30 tablet, Rfl: 0   amphetamine -dextroamphetamine  (ADDERALL) 10 MG tablet, Take 1 tablet (10 mg total) by mouth daily., Disp: 30 tablet, Rfl: 0   amphetamine -dextroamphetamine  (ADDERALL) 10 MG tablet, Take 1 tablet (10 mg total) by mouth daily., Disp: 30 tablet, Rfl: 0   buPROPion  (WELLBUTRIN  XL) 150 MG 24 hr tablet, TAKE ONE TABLET BY MOUTH DAILY WITH 300MG  TO EQUAL 450MG  DAILY DOSE, Disp: 90 tablet, Rfl: 2   buPROPion  (WELLBUTRIN  XL) 300 MG 24 hr tablet, Take 1 tablet (300 mg total) by mouth every morning., Disp: 90 tablet, Rfl: 2   cariprazine  (VRAYLAR ) 1.5 MG capsule, Take 1 capsule (1.5 mg total) by mouth daily., Disp: 30 capsule, Rfl: 2   clonazePAM  (KLONOPIN ) 0.5 MG tablet, Take 1 tablet (0.5 mg total) by mouth 2 (two) times daily as needed for anxiety., Disp: 60 tablet, Rfl: 2   divalproex  (DEPAKOTE  ER) 500 MG 24 hr tablet, TAKE TWO TABLETS BY MOUTH EVERY NIGHT AT BEDTIME, Disp: 60 tablet, Rfl: 2   gabapentin  (NEURONTIN ) 300 MG capsule, Take 1 capsule (300 mg total) by mouth 3 (three) times daily., Disp: 270 capsule, Rfl: 2   levothyroxine  (SYNTHROID ) 112 MCG tablet, TAKE 1 TABLET BY MOUTH DAILY, Disp: 30 tablet, Rfl: 0   naproxen  (NAPROSYN ) 500 MG tablet, TAKE 1 TAB BY MOUTH 2 (TWO) TIMES DAILY WITH A MEAL. AS NEEDED FOR MENSTRUAL CRAMPING., Disp: 30 tablet, Rfl: 0   Olopatadine  HCl 0.6 % SOLN, Place 2 sprays into the nose in the morning and at bedtime., Disp: 30.5 g, Rfl: 3   ondansetron  (ZOFRAN -ODT) 4 MG disintegrating tablet, Take 1 tablet (4 mg total) by mouth every 8 (eight) hours as needed for nausea or vomiting., Disp: 20 tablet, Rfl: 1   spironolactone (ALDACTONE) 25 MG tablet, TK 1 T PO BID, Disp: , Rfl: 2   traZODone  (DESYREL ) 100 MG tablet, Take 1 tablet (100 mg total) by mouth at bedtime., Disp: 90 tablet, Rfl:  2   albuterol  (VENTOLIN  HFA) 108 (90 Base) MCG/ACT inhaler, Inhale 2 puffs into the lungs every 6 (six) hours as needed for wheezing or shortness of breath., Disp: 1 each, Rfl: 0   clindamycin -benzoyl peroxide (BENZACLIN) gel, Apply topically 2 (two) times daily. For acne, Disp: 25 g, Rfl: 3   desloratadine  (CLARINEX ) 5 MG tablet, Take 1 tablet (5 mg total) by mouth daily., Disp: 90 tablet, Rfl: 3   fluticasone  (FLONASE ) 50 MCG/ACT nasal spray, Place 2 sprays into both nostrils daily., Disp: 16 g, Rfl: 4   montelukast  (SINGULAIR ) 10 MG tablet, Take 1 tablet (10 mg total) by mouth at bedtime., Disp: 90 tablet, Rfl: 1   norgestrel -ethinyl  estradiol  (LOW-OGESTREL ) 0.3-30 MG-MCG tablet, TAKE ONE TABLET BY MOUTH DAILY CONTINUOUSLY SKIP SUGAR PILL WEEK, Disp: 84 tablet, Rfl: PRN   pantoprazole  (PROTONIX ) 40 MG tablet, Take 1 tablet (40 mg total) by mouth 2 (two) times daily before a meal., Disp: 180 tablet, Rfl: 3  Allergies  Allergen Reactions   Other Hives and Shortness Of Breath    Develops hives with exposure to cold air.    Claritin [Loratadine]     Fainting spells   Sulfa Antibiotics    Latex Hives     ROS: Review of Systems A comprehensive review of systems was negative except for: Gastrointestinal: positive for dyspepsia Musculoskeletal: positive for right knee pain    Physical exam BP 112/76   Pulse (!) 110   Temp 97.7 F (36.5 C)   Ht 4' 11 (1.499 m)   Wt 190 lb 2 oz (86.2 kg)   SpO2 95%   BMI 38.40 kg/m  General appearance: alert, cooperative, appears stated age, and moderately obese Head: Normocephalic, without obvious abnormality, atraumatic Eyes: negative findings: lids and lashes normal, conjunctivae and sclerae normal, corneas clear, and pupils equal, round, reactive to light and accomodation Ears: normal TM's and external ear canals both ears Nose: Nares normal. Septum midline. Mucosa normal. No drainage or sinus tenderness. Throat: lips, mucosa, and tongue normal;  teeth and gums normal Neck: no adenopathy, supple, symmetrical, trachea midline, and thyroid  not enlarged, symmetric, no tenderness/mass/nodules Back: symmetric, no curvature. ROM normal. No CVA tenderness. Lungs: clear to auscultation bilaterally Heart: regular rate and rhythm, S1, S2 normal, no murmur, click, rub or gallop Abdomen: soft, non-tender; bowel sounds normal; no masses,  no organomegaly Extremities: extremities normal, atraumatic, no cyanosis or edema Pulses: 2+ and symmetric Skin: Skin color, texture, turgor normal. No rashes or lesions Lymph nodes: Cervical, supraclavicular, and axillary nodes normal. Neurologic: Grossly normal  MSK: Right knee with wrap in place     12/12/2023    2:43 PM 12/10/2022    3:58 PM 08/23/2022    3:27 PM  Depression screen PHQ 2/9  Decreased Interest 2 0 0  Down, Depressed, Hopeless 0 0 0  PHQ - 2 Score 2 0 0  Altered sleeping 0 0 0  Tired, decreased energy 0 0 0  Change in appetite 0 0 0  Feeling bad or failure about yourself  0 0 0  Trouble concentrating 0 0 0  Moving slowly or fidgety/restless 0 0 0  Suicidal thoughts 0 0 0  PHQ-9 Score 2 0 0  Difficult doing work/chores Not difficult at all Not difficult at all Not difficult at all      12/12/2023    2:44 PM 12/10/2022    3:58 PM 08/23/2022    3:27 PM 05/24/2022    1:32 PM  GAD 7 : Generalized Anxiety Score  Nervous, Anxious, on Edge 0 0 0 0  Control/stop worrying 0 0 0 0  Worry too much - different things 0 0 0 0  Trouble relaxing 0 0 0 0  Restless 0 0 0 0  Easily annoyed or irritable 0 0 0 0  Afraid - awful might happen 0 0 0 0  Total GAD 7 Score 0 0 0 0  Anxiety Difficulty Not difficult at all  Not difficult at all Not difficult at all     Assessment/ Plan: Sarai C [place] here for annual physical exam.   Annual physical exam  Dysmenorrhea treated with oral contraceptive - Plan: CMP14+EGFR, CBC, norgestrel -ethinyl estradiol  (LOW-OGESTREL )  0.3-30 MG-MCG tablet  POTS  (postural orthostatic tachycardia syndrome) - Plan: CMP14+EGFR, CBC  Acquired hypothyroidism - Plan: TSH + free T4, CMP14+EGFR  Family history of premature coronary heart disease - Plan: Lipid panel, Lipoprotein A (LPA)  Acute pain of right knee  Acne vulgaris - Plan: clindamycin -benzoyl peroxide (BENZACLIN) gel  Non-seasonal allergic rhinitis, unspecified trigger - Plan: albuterol  (VENTOLIN  HFA) 108 (90 Base) MCG/ACT inhaler, desloratadine  (CLARINEX ) 5 MG tablet, fluticasone  (FLONASE ) 50 MCG/ACT nasal spray, montelukast  (SINGULAIR ) 10 MG tablet  Gastroesophageal reflux disease without esophagitis - Plan: pantoprazole  (PROTONIX ) 40 MG tablet  OCP renewed.  Future fasting labs placed  POTS with mild tachycardia on exam today.  She is feeling well however. CBC, CMP ordered.  Sees endocrinology for thyroid .  I have ordered her thyroid  labs to be collected with my labs and we will CC Dr. Trixie on these results  New family history of MI.  Future order for lipid panel given obesity and I have also added lipoprotein a.  Reinforced need for diet modification to reduce weight  BenzaClin gel reordered  Allergy meds reordered  PPI renewed.  Again, reinforced need to modify diet to reduce flareups  Counseled on healthy lifestyle choices, including diet (rich in fruits, vegetables and lean meats and low in salt and simple carbohydrates) and exercise (at least 30 minutes of moderate physical activity daily).  Patient to follow up 1 year  Dontavis Tschantz M. Jolinda, DO

## 2023-12-21 ENCOUNTER — Other Ambulatory Visit: Payer: Self-pay | Admitting: Internal Medicine

## 2024-01-21 ENCOUNTER — Encounter: Payer: Self-pay | Admitting: Family Medicine

## 2024-03-16 ENCOUNTER — Encounter (HOSPITAL_COMMUNITY): Payer: Self-pay

## 2024-03-16 NOTE — Telephone Encounter (Signed)
 Please call pt to schedule

## 2024-04-14 ENCOUNTER — Other Ambulatory Visit (HOSPITAL_COMMUNITY): Payer: Self-pay | Admitting: Psychiatry

## 2024-04-14 ENCOUNTER — Other Ambulatory Visit: Payer: Self-pay | Admitting: Internal Medicine

## 2024-04-15 NOTE — Telephone Encounter (Signed)
 Call for appt

## 2024-04-16 NOTE — Telephone Encounter (Signed)
 Per last OV note: Return in about 6 months (around 04/06/2023).

## 2024-04-21 NOTE — Telephone Encounter (Signed)
 LMx2,MyCx2 to schedule follow up appointment with Dr. Trixie.

## 2024-04-23 NOTE — Telephone Encounter (Signed)
 LMx3,MyCx3 to schedule follow up with Dr. Trixie.

## 2024-04-27 ENCOUNTER — Encounter (HOSPITAL_COMMUNITY): Payer: Self-pay | Admitting: Psychiatry

## 2024-04-27 ENCOUNTER — Telehealth (INDEPENDENT_AMBULATORY_CARE_PROVIDER_SITE_OTHER): Payer: Self-pay | Admitting: Psychiatry

## 2024-04-27 ENCOUNTER — Other Ambulatory Visit

## 2024-04-27 DIAGNOSIS — F3162 Bipolar disorder, current episode mixed, moderate: Secondary | ICD-10-CM

## 2024-04-27 DIAGNOSIS — F988 Other specified behavioral and emotional disorders with onset usually occurring in childhood and adolescence: Secondary | ICD-10-CM | POA: Diagnosis not present

## 2024-04-27 DIAGNOSIS — F331 Major depressive disorder, recurrent, moderate: Secondary | ICD-10-CM

## 2024-04-27 MED ORDER — AMPHETAMINE-DEXTROAMPHET ER 20 MG PO CP24: 40.0000 mg | ORAL_CAPSULE | ORAL | 0 refills | Status: AC

## 2024-04-27 MED ORDER — TRAZODONE HCL 100 MG PO TABS: 100.0000 mg | ORAL_TABLET | Freq: Every day | ORAL | 2 refills | Status: AC

## 2024-04-27 MED ORDER — CARIPRAZINE HCL 1.5 MG PO CAPS: 1.5000 mg | ORAL_CAPSULE | Freq: Every day | ORAL | 2 refills | Status: AC

## 2024-04-27 MED ORDER — AMPHETAMINE-DEXTROAMPHETAMINE 10 MG PO TABS: 10.0000 mg | ORAL_TABLET | Freq: Every day | ORAL | 0 refills | Status: AC

## 2024-04-27 MED ORDER — DIVALPROEX SODIUM ER 500 MG PO TB24: ORAL_TABLET | ORAL | 2 refills | Status: AC

## 2024-04-27 MED ORDER — GABAPENTIN 300 MG PO CAPS: 300.0000 mg | ORAL_CAPSULE | Freq: Three times a day (TID) | ORAL | 2 refills | Status: AC

## 2024-04-27 MED ORDER — BUPROPION HCL ER (XL) 300 MG PO TB24: 300.0000 mg | ORAL_TABLET | ORAL | 2 refills | Status: AC

## 2024-04-27 MED ORDER — BUPROPION HCL ER (XL) 150 MG PO TB24: ORAL_TABLET | ORAL | 2 refills | Status: AC

## 2024-04-27 MED ORDER — CLONAZEPAM 0.5 MG PO TABS: 0.5000 mg | ORAL_TABLET | Freq: Two times a day (BID) | ORAL | 2 refills | Status: AC | PRN

## 2024-04-27 NOTE — Progress Notes (Signed)
 Virtual Visit via Video Note  I connected with Samantha Tucker on 04/27/2024 at  3:20 PM EST by a video enabled telemedicine application and verified that I am speaking with the correct person using two identifiers.  Location: Patient: home Provider: office   I discussed the limitations of evaluation and management by telemedicine and the availability of in person appointments. The patient expressed understanding and agreed to proceed.    I discussed the assessment and treatment plan with the patient. The patient was provided an opportunity to ask questions and all were answered. The patient agreed with the plan and demonstrated an understanding of the instructions.   The patient was advised to call back or seek an in-person evaluation if the symptoms worsen or if the condition fails to improve as anticipated.  I provided 20 minutes of non-face-to-face time during this encounter.   Barnie Gull, MD  [facility] MD/PA/NP OP Progress Note  04/27/2024 3:35 PM Samantha Tucker  MRN:  [id number]  Chief Complaint:  Chief Complaint  Patient presents with   ADD   Anxiety   Depression   Follow-up   HPI: This patient is a 28 year-old single white female who lives with her family in [place].  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  The patient returns for follow-up after about 11 months regarding her major depression generalized anxiety disorder insomnia and ADHD she states that she lost her insurance and this is why she has not been back for such a long time.  She now has adult Medicaid.  She is no longer getting the Adderall XR or the Vraylar  that she is getting her other medicines although not the levothyroxine .  She is following up with endocrinology this week.  She states that she felt better with the Vraylar  with less mood swings.  However she denies overt depression or thoughts of self-harm.  She is sleeping well and denies  significant anxiety or panic attacks.  She is focusing poorly without the Adderall XR Visit Diagnosis:    ICD-10-CM   1. ADD (attention deficit disorder) without hyperactivity  F98.8     2. Major depressive disorder, recurrent episode, moderate (HCC)  F33.1 buPROPion  (WELLBUTRIN  XL) 300 MG 24 hr tablet    3. Moderate mixed bipolar I disorder (HCC)  F31.62       Past Psychiatric History: Long-term outpatient treatment  Past Medical History:  Past Medical History:  Diagnosis Date   ADHD (attention deficit hyperactivity disorder)    Allergy    Anxiety    Constipation    Environmental allergies    Nausea    Postural hypotension     Past Surgical History:  Procedure Laterality Date   TYMPANOSTOMY TUBE PLACEMENT Bilateral [id number]    Family Psychiatric History: See below  Family History:  Family History  Problem Relation Age of Onset   Diabetes Mother    Bipolar disorder Father    Bipolar disorder Sister    Bipolar disorder Brother    Thyroid  disease Brother    Bipolar disorder Brother    Bipolar disorder Brother    Heart attack Maternal Aunt        14s   Bipolar disorder Paternal Uncle    Alcohol abuse Paternal Uncle    Bipolar disorder Maternal Grandmother    Bipolar disorder Paternal Grandmother    Bipolar disorder Paternal Grandfather    Alcohol abuse Paternal Actor  Social History:  Social History   Socioeconomic History   Marital status: Single    Spouse name: Not on file   Number of children: Not on file   Years of education: Not on file   Highest education level: Not on file  Occupational History   Not on file  Tobacco Use   Smoking status: Never   Smokeless tobacco: Never  Substance and Sexual Activity   Alcohol use: No   Drug use: No   Sexual activity: Never  Other Topics Concern   Not on file  Social History Narrative   Not on file   Social Drivers of Health   Tobacco Use: Low Risk (04/27/2024)   Patient History    Smoking  Tobacco Use: Never    Smokeless Tobacco Use: Never    Passive Exposure: Not on file  Financial Resource Strain: Not on file  Food Insecurity: Not on file  Transportation Needs: Not on file  Physical Activity: Not on file  Stress: Not on file  Social Connections: Not on file  Depression (PHQ2-9): Low Risk (12/12/2023)   Depression (PHQ2-9)    PHQ-2 Score: 2  Alcohol Screen: Not on file  Housing: Not on file  Utilities: Not on file  Health Literacy: Not on file    Allergies: Allergies[1]  Metabolic Disorder Labs: Lab Results  Component Value Date   HGBA1C 5.7 (H) 05/24/2022   Lab Results  Component Value Date   PROLACTIN 17.7 10/17/2021   No results found for: CHOL, TRIG, HDL, CHOLHDL, VLDL, LDLCALC Lab Results  Component Value Date   TSH 6.39 (H) 04/18/2023   TSH 3.30 10/04/2022    Therapeutic Level Labs: No results found for: LITHIUM Lab Results  Component Value Date   VALPROATE 60.0 08/18/2020   VALPROATE 74.5 01/26/2019   No results found for: CBMZ  Current Medications: Current Outpatient Medications  Medication Sig Dispense Refill   albuterol  (VENTOLIN  HFA) 108 (90 Base) MCG/ACT inhaler Inhale 2 puffs into the lungs every 6 (six) hours as needed for wheezing or shortness of breath. 1 each 0   amoxicillin  (AMOXIL ) 500 MG capsule Take 500 mg by mouth 3 (three) times daily.     amphetamine -dextroamphetamine  (ADDERALL XR) 20 MG 24 hr capsule Take 2 capsules (40 mg total) by mouth every morning. 60 capsule 0   amphetamine -dextroamphetamine  (ADDERALL XR) 20 MG 24 hr capsule Take 2 capsules (40 mg total) by mouth every morning. 60 capsule 0   amphetamine -dextroamphetamine  (ADDERALL XR) 20 MG 24 hr capsule Take 2 capsules (40 mg total) by mouth every morning. 60 capsule 0   amphetamine -dextroamphetamine  (ADDERALL) 10 MG tablet Take 1 tablet (10 mg total) by mouth daily. 30 tablet 0   amphetamine -dextroamphetamine  (ADDERALL) 10 MG tablet Take 1 tablet (10  mg total) by mouth daily. 30 tablet 0   amphetamine -dextroamphetamine  (ADDERALL) 10 MG tablet Take 1 tablet (10 mg total) by mouth daily. 30 tablet 0   buPROPion  (WELLBUTRIN  XL) 150 MG 24 hr tablet TAKE ONE TABLET BY MOUTH DAILY WITH 300MG  TO EQUAL 450MG  DAILY DOSE 90 tablet 2   buPROPion  (WELLBUTRIN  XL) 300 MG 24 hr tablet Take 1 tablet (300 mg total) by mouth every morning. 90 tablet 2   cariprazine  (VRAYLAR ) 1.5 MG capsule Take 1 capsule (1.5 mg total) by mouth daily. 30 capsule 2   clindamycin -benzoyl peroxide (BENZACLIN) gel Apply topically 2 (two) times daily. For acne 25 g 3   clonazePAM  (KLONOPIN ) 0.5 MG tablet Take 1 tablet (0.5 mg  total) by mouth 2 (two) times daily as needed for anxiety. 60 tablet 2   desloratadine  (CLARINEX ) 5 MG tablet Take 1 tablet (5 mg total) by mouth daily. 90 tablet 3   divalproex  (DEPAKOTE  ER) 500 MG 24 hr tablet TAKE TWO TABLETS BY MOUTH EVERY NIGHT AT BEDTIME 180 tablet 2   fluticasone  (FLONASE ) 50 MCG/ACT nasal spray Place 2 sprays into both nostrils daily. 16 g 4   gabapentin  (NEURONTIN ) 300 MG capsule Take 1 capsule (300 mg total) by mouth 3 (three) times daily. 270 capsule 2   levothyroxine  (SYNTHROID ) 112 MCG tablet TAKE 1 TABLET BY MOUTH DAILY 30 tablet 0   montelukast  (SINGULAIR ) 10 MG tablet Take 1 tablet (10 mg total) by mouth at bedtime. 90 tablet 1   naproxen  (NAPROSYN ) 500 MG tablet TAKE 1 TAB BY MOUTH 2 (TWO) TIMES DAILY WITH A MEAL. AS NEEDED FOR MENSTRUAL CRAMPING. 30 tablet 0   norgestrel -ethinyl estradiol  (LOW-OGESTREL ) 0.3-30 MG-MCG tablet TAKE ONE TABLET BY MOUTH DAILY CONTINUOUSLY SKIP SUGAR PILL WEEK 84 tablet PRN   Olopatadine  HCl 0.6 % SOLN Place 2 sprays into the nose in the morning and at bedtime. 30.5 g 3   ondansetron  (ZOFRAN -ODT) 4 MG disintegrating tablet Take 1 tablet (4 mg total) by mouth every 8 (eight) hours as needed for nausea or vomiting. 20 tablet 1   pantoprazole  (PROTONIX ) 40 MG tablet Take 1 tablet (40 mg total) by mouth 2  (two) times daily before a meal. 180 tablet 3   spironolactone (ALDACTONE) 25 MG tablet TK 1 T PO BID  2   traZODone  (DESYREL ) 100 MG tablet Take 1 tablet (100 mg total) by mouth at bedtime. 90 tablet 2   No current facility-administered medications for this visit.     Musculoskeletal: Strength & Muscle Tone: within normal limits Gait & Station: normal Patient leans: N/A  Psychiatric Specialty Exam: Review of Systems  Psychiatric/Behavioral:  Positive for decreased concentration.   All other systems reviewed and are negative.   There were no vitals taken for this visit.There is no height or weight on file to calculate BMI.  General Appearance: Casual and Fairly Groomed  Eye Contact:  Good  Speech:  Clear and Coherent  Volume:  Normal  Mood:  Euthymic  Affect:  Congruent  Thought Process:  Goal Directed  Orientation:  Full (Time, Place, and Person)  Thought Content: WDL   Suicidal Thoughts:  No  Homicidal Thoughts:  No  Memory:  Immediate;   Good Recent;   Good Remote;   NA  Judgement:  Good  Insight:  Fair  Psychomotor Activity:  Normal  Concentration:  Concentration: Poor and Attention Span: Poor  Recall:  Good  Fund of Knowledge: Good  Language: Good  Akathisia:  No  Handed:  Right  AIMS (if indicated): not done  Assets:  Communication Skills Desire for Improvement Physical Health Resilience Social Support Talents/Skills  ADL's:  Intact  Cognition: WNL  Sleep:  Good   Screenings: GAD-7    Flowsheet Row Office Visit from 12/12/2023 in [facility] Office Visit from 12/10/2022 in [facility] Office Visit from 08/23/2022 in [facility] Office Visit from 05/24/2022 in [facility] Office Visit from [facility]/20[facility]  Total GAD-7 Score 0 0 0 0 1   PHQ2-9    Flowsheet  Row Office Visit from 12/12/2023 in  [facility] Office Visit from 12/10/2022 in [facility] Office Visit from 08/23/2022 in [facility] Office Visit from 05/24/2022 in [facility] Video Visit from 01/10/2022 in [facility] Total Score 2 0 0 0 0  PHQ-9 Total Score 2 0 0 -- --   Flowsheet Row Video Visit from 01/10/2022 in [facility] [place] Video Visit from 08/03/2021 in [facility] [place] Video Visit from 09/28/2020 in [facility] [place]  C-SSRS RISK CATEGORY No Risk No Risk Error: Question 6 not populated     Assessment and Plan: This patient is a 28 year old female with a history of major depression possible mixed bipolar disorder ADHD and insomnia.  She was doing well when she was on all of her medications.  She will continue Vraylar  1.5 mg for bipolar depression, Depakote  ER 1000 mg at bedtime also for bipolar disorder gabapentin  300 mg 3 times daily for anxiety, clonazepam  0.5 mg twice daily as needed for anxiety, trazodone  100 mg at bedtime for sleep, Wellbutrin  XL 450 mg daily for depression, Adderall XR 40 mg in the morning and regular Adderall 10 mg later in the day for ADD.  She will return to see me in 3 months  Collaboration of Care: Collaboration of Care: Primary Care Provider AEB notes are shared with PCP on the epic system  Patient/Guardian was advised Release of Information must be obtained prior to any record release in order to collaborate their care with an outside provider. Patient/Guardian was advised if they have not already done so to contact the registration department to sign all necessary forms in order for us  to release information regarding their care.   Consent: Patient/Guardian gives  verbal consent for treatment and assignment of benefits for services provided during this visit. Patient/Guardian expressed understanding and agreed to proceed.    Barnie Gull, MD 04/27/2024, 3:35 PM     [1]  Allergies Allergen Reactions   Other Hives and Shortness Of Breath    Develops hives with exposure to cold air.    Claritin [Loratadine]     Fainting spells   Sulfa Antibiotics    Latex Hives

## 2024-04-28 ENCOUNTER — Telehealth (HOSPITAL_COMMUNITY): Payer: Self-pay

## 2024-04-28 NOTE — Telephone Encounter (Signed)
 Prior authorization for pt's Vraylar  1.5 MG Capsule approved from 04/28/24 to 04/28/25

## 2024-04-30 ENCOUNTER — Encounter: Payer: Self-pay | Admitting: Internal Medicine

## 2024-04-30 ENCOUNTER — Ambulatory Visit: Admitting: Internal Medicine

## 2024-04-30 ENCOUNTER — Other Ambulatory Visit

## 2024-04-30 VITALS — BP 120/70 | HR 117 | Ht 59.0 in | Wt 191.6 lb

## 2024-04-30 DIAGNOSIS — E039 Hypothyroidism, unspecified: Secondary | ICD-10-CM

## 2024-04-30 DIAGNOSIS — E66812 Obesity, class 2: Secondary | ICD-10-CM | POA: Diagnosis not present

## 2024-04-30 NOTE — Progress Notes (Signed)
 Patient ID: Samantha Tucker, female   DOB: Mar 07, 1996, 28 y.o.   MRN: [id number]  HPI  Samantha Tucker is a 28 y.o.-year-old female, initially referred by her PCP, Dr. Jolinda, returning for follow-up for Hashimoto's hypothyroidism.  She previously saw Kassie and then Dr. Von, but last visit with me 1 year ago.  Her mother, Samantha Tucker,  is also my patient.  She accompanies the patient today.  Interim history: She has sore throat, feeling more tired, but no fever, cough. Before last visit, she had some weight gain recently (before last visit she lost 22 pounds) and feeling more tired. She is still very tired.   Reviewed history: Pt. has been dx with hypothyroidism in 2019 - irregular menses, low AP, mood swings. She is   She was started on Levothyroxine .  She started to feel better, however, she reports eating a significant amount of weight since then, per our chart approximately 60 pounds.  She gained approximately 23 pounds in 2023.   At our first visit in 05/2022, we increased her LT4 dose.  Pt. is on levothyroxine  112 mcg daily, dose increased in 04/2023, at our last visit: - in am - fasting - previously 30 min from b'fast, but mostly skipping b'fast or drinking a V8 - no calcium - no iron - no multivitamins - she started  PPIs (Protonix ) - 05/2022, prev. Pepcid for a long time - 30 min -1h after LT4 >> now taking Protonix  later in the day >> 2 tablets at night - not on Biotin - on Vitamin D3 daily  I reviewed pt's thyroid  tests: Lab Results  Component Value Date   TSH 6.39 (H) 04/18/2023   TSH 3.30 10/04/2022   TSH 3.28 07/18/2022   TSH 18.500 (H) 05/24/2022   TSH 4.43 10/17/2021   TSH 4.900 (H) 03/16/2021   TSH 3.04 10/06/2020   TSH 4.150 04/21/2019   TSH 6.39 (H) 01/22/2019   TSH 5.00 07/20/2018   FREET4 1.4 04/18/2023   FREET4 1.13 10/04/2022   FREET4 1.06 07/18/2022   FREET4 1.01 05/24/2022   FREET4 0.80 10/17/2021   FREET4 1.26 03/16/2021   FREET4 0.87 10/06/2020    FREET4 0.88 01/22/2019   FREET4 0.73 07/20/2018   FREET4 1.33 08/22/2017   Antithyroid antibodies were positive: Component     Latest Ref Rng 07/18/2022  Thyroglobulin Ab     < or = 1 IU/mL 12 (H)   Thyroperoxidase Ab SerPl-aCnc     <9 IU/mL 325 (H)    Pt denies: - feeling nodules in neck - hoarseness - dysphagia - choking  She has + FH of thyroid  disorders in: Brother. No FH of thyroid  cancer. No h/o radiation tx to head or neck. No recent use of iodine supplements.  No herbal supplements.  No recent steroid use.  Pt. also has a history of suspected PCOS:  Reviewed previous investigation:  - Normal pelvic ultrasound 10/08/2018 (ordered by OB/GYN-Dr. Starla): Uterus: 8.3 x 3.4 x 4.3 cm = volume: 63.4 mL. No fibroids or other mass visualized. Endometrium Thickness: 4.1 mm.  Normal trilaminar appearance Right ovary: 4.1 x 2.1 x 2.0 cm = volume: 9.1 mL. Corpus luteum noted. Left ovary: 2.5 x 1.2 x 1.6 cm = volume: 2.6 mL. Normal appearance/no adnexal mass. Other findings:  No abnormal free fluid   IMPRESSION: 1. Normal pelvic sonogram.  - Normal ACTH  and cortisol levels in 03/2021: Component     Latest Ref Rng 03/16/2021  Cortisol, Plasma  mcg/dL 88.4   R793 ACTH      6 - 50 pg/mL 15    - Normal testosterone  and prolactin levels in 10/2021 - Dr. Von advised her that she likely did not have PCOS and was not a candidate for metformin.  He recommended a referral to the Weight management clinic. Component     Latest Ref Rng 10/17/2021  Testosterone      13 - 71 ng/dL 26   Testosterone  Free     0.0 - 4.2 pg/mL 1.5   Sex Horm Binding Glob, Serum     24.6 - 122.0 nmol/L 26.4   Prolactin     4.8 - 23.3 ng/mL 17.7    She is on spironolactone.  She has a h/o POTS - dx'ed 2014 - now controlled.  ROS: + see HPI  Past Medical History:  Diagnosis Date   ADHD (attention deficit hyperactivity disorder)    Allergy    Anxiety    Constipation    Environmental allergies     Nausea    Postural hypotension    Past Surgical History:  Procedure Laterality Date   TYMPANOSTOMY TUBE PLACEMENT Bilateral [id number]   Social History   Socioeconomic History   Marital status: Single    Spouse name: Not on file   Number of children: Not on file   Years of education: Not on file   Highest education level: Not on file  Occupational History   Not on file  Tobacco Use   Smoking status: Never   Smokeless tobacco: Never  Substance and Sexual Activity   Alcohol use: No   Drug use: No   Sexual activity: Never  Other Topics Concern   Not on file  Social History Narrative   Not on file   Social Drivers of Health   Tobacco Use: Low Risk (04/27/2024)   Patient History    Smoking Tobacco Use: Never    Smokeless Tobacco Use: Never    Passive Exposure: Not on file  Financial Resource Strain: Not on file  Food Insecurity: Not on file  Transportation Needs: Not on file  Physical Activity: Not on file  Stress: Not on file  Social Connections: Not on file  Intimate Partner Violence: Not on file  Depression (PHQ2-9): Low Risk (12/12/2023)   Depression (PHQ2-9)    PHQ-2 Score: 2  Alcohol Screen: Not on file  Housing: Not on file  Utilities: Not on file  Health Literacy: Not on file   Current Outpatient Medications on File Prior to Visit  Medication Sig Dispense Refill   albuterol  (VENTOLIN  HFA) 108 (90 Base) MCG/ACT inhaler Inhale 2 puffs into the lungs every 6 (six) hours as needed for wheezing or shortness of breath. 1 each 0   amoxicillin  (AMOXIL ) 500 MG capsule Take 500 mg by mouth 3 (three) times daily.     amphetamine -dextroamphetamine  (ADDERALL XR) 20 MG 24 hr capsule Take 2 capsules (40 mg total) by mouth every morning. 60 capsule 0   amphetamine -dextroamphetamine  (ADDERALL XR) 20 MG 24 hr capsule Take 2 capsules (40 mg total) by mouth every morning. 60 capsule 0   amphetamine -dextroamphetamine  (ADDERALL XR) 20 MG 24 hr capsule Take 2 capsules (40 mg total)  by mouth every morning. 60 capsule 0   amphetamine -dextroamphetamine  (ADDERALL) 10 MG tablet Take 1 tablet (10 mg total) by mouth daily. 30 tablet 0   amphetamine -dextroamphetamine  (ADDERALL) 10 MG tablet Take 1 tablet (10 mg total) by mouth daily. 30 tablet 0   amphetamine -dextroamphetamine  (ADDERALL) 10 MG  tablet Take 1 tablet (10 mg total) by mouth daily. 30 tablet 0   buPROPion  (WELLBUTRIN  XL) 150 MG 24 hr tablet TAKE ONE TABLET BY MOUTH DAILY WITH 300MG  TO EQUAL 450MG  DAILY DOSE 90 tablet 2   buPROPion  (WELLBUTRIN  XL) 300 MG 24 hr tablet Take 1 tablet (300 mg total) by mouth every morning. 90 tablet 2   cariprazine  (VRAYLAR ) 1.5 MG capsule Take 1 capsule (1.5 mg total) by mouth daily. 30 capsule 2   clindamycin -benzoyl peroxide (BENZACLIN) gel Apply topically 2 (two) times daily. For acne 25 g 3   clonazePAM  (KLONOPIN ) 0.5 MG tablet Take 1 tablet (0.5 mg total) by mouth 2 (two) times daily as needed for anxiety. 60 tablet 2   desloratadine  (CLARINEX ) 5 MG tablet Take 1 tablet (5 mg total) by mouth daily. 90 tablet 3   divalproex  (DEPAKOTE  ER) 500 MG 24 hr tablet TAKE TWO TABLETS BY MOUTH EVERY NIGHT AT BEDTIME 180 tablet 2   fluticasone  (FLONASE ) 50 MCG/ACT nasal spray Place 2 sprays into both nostrils daily. 16 g 4   gabapentin  (NEURONTIN ) 300 MG capsule Take 1 capsule (300 mg total) by mouth 3 (three) times daily. 270 capsule 2   levothyroxine  (SYNTHROID ) 112 MCG tablet TAKE 1 TABLET BY MOUTH DAILY 30 tablet 0   montelukast  (SINGULAIR ) 10 MG tablet Take 1 tablet (10 mg total) by mouth at bedtime. 90 tablet 1   naproxen  (NAPROSYN ) 500 MG tablet TAKE 1 TAB BY MOUTH 2 (TWO) TIMES DAILY WITH A MEAL. AS NEEDED FOR MENSTRUAL CRAMPING. 30 tablet 0   norgestrel -ethinyl estradiol  (LOW-OGESTREL ) 0.3-30 MG-MCG tablet TAKE ONE TABLET BY MOUTH DAILY CONTINUOUSLY SKIP SUGAR PILL WEEK 84 tablet PRN   Olopatadine  HCl 0.6 % SOLN Place 2 sprays into the nose in the morning and at bedtime. 30.5 g 3    ondansetron  (ZOFRAN -ODT) 4 MG disintegrating tablet Take 1 tablet (4 mg total) by mouth every 8 (eight) hours as needed for nausea or vomiting. 20 tablet 1   pantoprazole  (PROTONIX ) 40 MG tablet Take 1 tablet (40 mg total) by mouth 2 (two) times daily before a meal. 180 tablet 3   spironolactone (ALDACTONE) 25 MG tablet TK 1 T PO BID  2   traZODone  (DESYREL ) 100 MG tablet Take 1 tablet (100 mg total) by mouth at bedtime. 90 tablet 2   No current facility-administered medications on file prior to visit.   Allergies  Allergen Reactions   Other Hives and Shortness Of Breath    Develops hives with exposure to cold air.    Claritin [Loratadine]     Fainting spells   Sulfa Antibiotics    Latex Hives   Family History  Problem Relation Age of Onset   Diabetes Mother    Bipolar disorder Father    Bipolar disorder Sister    Bipolar disorder Brother    Thyroid  disease Brother    Bipolar disorder Brother    Bipolar disorder Brother    Heart attack Maternal Aunt        74s   Bipolar disorder Paternal Uncle    Alcohol abuse Paternal Uncle    Bipolar disorder Maternal Grandmother    Bipolar disorder Paternal Grandmother    Bipolar disorder Paternal Grandfather    Alcohol abuse Paternal Grandfather    PE: BP 120/70   Pulse (!) 117   Ht 4' 11 (1.499 m)   Wt 191 lb 9.6 oz (86.9 kg)   SpO2 97%   BMI 38.70 kg/m  Wt Readings from Last 10 Encounters:  04/30/24 191 lb 9.6 oz (86.9 kg)  12/12/23 190 lb 2 oz (86.2 kg)  12/10/22 177 lb (80.3 kg)  10/04/22 180 lb 3.2 oz (81.7 kg)  08/23/22 186 lb (84.4 kg)  06/07/22 202 lb (91.6 kg)  05/24/22 201 lb 9.6 oz (91.4 kg)  10/11/21 197 lb 3.2 oz (89.4 kg)  07/05/21 179 lb 3.2 oz (81.3 kg)  03/16/21 160 lb 4.8 oz (72.7 kg)   Constitutional: overweight, in NAD Eyes:  EOMI, no exophthalmos ENT: no neck masses, no cervical lymphadenopathy Cardiovascular: tachycardia, RR, No MRG Respiratory: CTA B Musculoskeletal: no deformities Skin:no  rashes Neurological: no tremor with outstretched hands  ASSESSMENT: 1.  Hashimoto's hypothyroidism  2.  Obesity class 2  PLAN:  1. Patient with longstanding, uncontrolled, hypothyroidism, on levothyroxine  - TPO and ATA antibodies were elevated, giving her a diagnosis of Hashimoto's thyroiditis - latest thyroid  labs reviewed with pt. >> TSH was elevated: Lab Results  Component Value Date   TSH 6.39 (H) 04/18/2023  - she continues on LT4 112 mcg daily, dose increased after the above results returned but she did not come back for labs as advised. - pt feels good on this dose.  Her weight is fluctuating and this could be influencing her TFT.  She gained 11 pounds since last in person visit. - we discussed about taking the thyroid  hormone every day, with water, >30 minutes before breakfast, separated by >4 hours from acid reflux medications, calcium, iron, multivitamins. Pt. is taking it correctly.  She was previously taking acid reflux medications too close to levothyroxine  and we discussed about separating them by at least 2 hours.  She is not taking them correctly.  We did discuss that if she misses a dose she can take 2 tablets the next day. - will check thyroid  tests today: TSH and fT4 - we again discussed that it is important that if labs are abnormal, to return for repeat TFTs in 1.5 months - I we will see her back in a year  2.  Obesity class 2 - She gained ~60 pounds after her diagnosis of hypothyroidism; but afterwards weight was greatly fluctuating.  Since our in person visit in 09/2022, she gained 11 pounds. - She has a previously questionable history of PCOS (transvaginal ultrasound and testosterone  levels normal), cortisol and ACTH  levels normal, prolactin also normal.   - She has a history of depression, which can contribute to weight gain. - She continues to have fatigue.  As mentioned above, TSH was elevated.  We increased her levothyroxine  dose but unfortunately she did not  come for a repeat set of labs.  Will check this today.  We did discuss that uncontrolled hypothyroidism can contribute to obesity.  Orders Placed This Encounter  Procedures   TSH   T4, free   Lela Fendt, MD PhD [facility] End[facility]

## 2024-04-30 NOTE — Patient Instructions (Signed)
 Please continue levothyroxine  112 mcg daily.  Take the thyroid  hormone every day, with water, at least 30 minutes before breakfast, separated by at least 4 hours from: - acid reflux medications - calcium - iron - multivitamins  Please stop at the lab.  Please return for another visit in 1 year.

## 2024-05-01 LAB — TSH: TSH: 3.16 m[IU]/L

## 2024-05-01 LAB — T4, FREE: Free T4: 1.5 ng/dL (ref 0.8–1.8)

## 2024-05-03 ENCOUNTER — Ambulatory Visit: Payer: Self-pay | Admitting: Internal Medicine

## 2024-05-03 MED ORDER — LEVOTHYROXINE SODIUM 112 MCG PO TABS: 112.0000 ug | ORAL_TABLET | Freq: Every day | ORAL | 3 refills | Status: DC

## 2024-05-03 NOTE — Addendum Note (Signed)
 Addended by: TRIXIE FILE on: 05/03/2024 01:06 PM   Modules accepted: Orders

## 2024-05-26 ENCOUNTER — Encounter: Payer: Self-pay | Admitting: Family Medicine

## 2024-06-03 ENCOUNTER — Other Ambulatory Visit: Payer: Self-pay | Admitting: Internal Medicine

## 2024-06-15 ENCOUNTER — Telehealth: Payer: Self-pay | Admitting: Pharmacy Technician

## 2024-06-15 ENCOUNTER — Other Ambulatory Visit (HOSPITAL_COMMUNITY): Payer: Self-pay

## 2024-06-15 NOTE — Telephone Encounter (Signed)
 Pharmacy Patient Advocate Encounter   Received notification from [employer] KEY that prior authorization for Desloratadine  5MG  tablets is required/requested.   Insurance verification completed.   The patient is insured through [employer].   Per test claim: PA required; PA submitted to above mentioned insurance via Latent Key/confirmation #/EOC [id number] Status is pending

## 2024-06-16 ENCOUNTER — Other Ambulatory Visit (HOSPITAL_COMMUNITY): Payer: Self-pay

## 2024-06-16 NOTE — Telephone Encounter (Signed)
 Pharmacy Patient Advocate Encounter  Received notification from [facility] MEDICAID that Prior Authorization for Desloratadine  5MG  tablets has been APPROVED from 06/15/2024 to 06/15/2025. Ran test claim, Copay is $4.00. This test claim was processed through [facility]- copay amounts may vary at other pharmacies due to pharmacy/plan contracts, or as the patient moves through the different stages of their insurance plan.   PA #/Case ID/Reference #: [id number]

## 2024-12-13 ENCOUNTER — Encounter: Payer: Self-pay | Admitting: Family Medicine

## 2025-05-02 ENCOUNTER — Ambulatory Visit: Admitting: Internal Medicine
